# Patient Record
Sex: Female | Born: 1960 | Race: White | Hispanic: No | Marital: Married | Smoking: Never smoker
Health system: Southern US, Community
[De-identification: ages and names within clinical notes are randomized; demographics above are authoritative.]

## PROBLEM LIST (undated history)

## (undated) DIAGNOSIS — C801 Malignant (primary) neoplasm, unspecified: Secondary | ICD-10-CM

## (undated) DIAGNOSIS — R112 Nausea with vomiting, unspecified: Secondary | ICD-10-CM

## (undated) DIAGNOSIS — M1991 Primary osteoarthritis, unspecified site: Secondary | ICD-10-CM

## (undated) DIAGNOSIS — I10 Essential (primary) hypertension: Secondary | ICD-10-CM

## (undated) DIAGNOSIS — M1990 Unspecified osteoarthritis, unspecified site: Secondary | ICD-10-CM

## (undated) DIAGNOSIS — M25472 Effusion, left ankle: Secondary | ICD-10-CM

## (undated) DIAGNOSIS — G4730 Sleep apnea, unspecified: Secondary | ICD-10-CM

## (undated) DIAGNOSIS — L900 Lichen sclerosus et atrophicus: Secondary | ICD-10-CM

## (undated) DIAGNOSIS — E119 Type 2 diabetes mellitus without complications: Secondary | ICD-10-CM

## (undated) DIAGNOSIS — D649 Anemia, unspecified: Secondary | ICD-10-CM

## (undated) DIAGNOSIS — E785 Hyperlipidemia, unspecified: Secondary | ICD-10-CM

## (undated) DIAGNOSIS — G4733 Obstructive sleep apnea (adult) (pediatric): Secondary | ICD-10-CM

## (undated) DIAGNOSIS — K219 Gastro-esophageal reflux disease without esophagitis: Secondary | ICD-10-CM

## (undated) DIAGNOSIS — Z9889 Other specified postprocedural states: Secondary | ICD-10-CM

## (undated) DIAGNOSIS — E039 Hypothyroidism, unspecified: Secondary | ICD-10-CM

## (undated) DIAGNOSIS — M25471 Effusion, right ankle: Secondary | ICD-10-CM

## (undated) DIAGNOSIS — R42 Dizziness and giddiness: Secondary | ICD-10-CM

## (undated) HISTORY — DX: Hyperlipidemia, unspecified: E78.5

## (undated) HISTORY — DX: Obstructive sleep apnea (adult) (pediatric): G47.33

## (undated) HISTORY — DX: Essential (primary) hypertension: I10

## (undated) HISTORY — DX: Primary osteoarthritis, unspecified site: M19.91

## (undated) HISTORY — DX: Type 2 diabetes mellitus without complications: E11.9

## (undated) HISTORY — PX: TONSILLECTOMY: SUR1361

## (undated) HISTORY — DX: Gastro-esophageal reflux disease without esophagitis: K21.9

## (undated) HISTORY — PX: BREAST LUMPECTOMY: SHX2

## (undated) HISTORY — PX: ABDOMINAL HYSTERECTOMY: SHX81

## (undated) HISTORY — DX: Lichen sclerosus et atrophicus: L90.0

## (undated) HISTORY — PX: ELBOW SURGERY: SHX618

## (undated) HISTORY — PX: BARIATRIC SURGERY: SHX1103

---
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
## 1985-06-22 HISTORY — PX: KNEE ARTHROSCOPY W/ LATERAL RELEASE: SHX1873

## 1991-06-23 HISTORY — PX: KNEE ARTHROSCOPY: SUR90

## 1997-10-31 ENCOUNTER — Encounter: Admission: RE | Admit: 1997-10-31 | Discharge: 1998-01-29 | Payer: Self-pay | Admitting: *Deleted

## 1998-04-16 ENCOUNTER — Encounter: Admission: RE | Admit: 1998-04-16 | Discharge: 1998-07-12 | Payer: Self-pay | Admitting: Internal Medicine

## 1999-12-23 ENCOUNTER — Encounter: Payer: Self-pay | Admitting: Orthopedic Surgery

## 1999-12-29 ENCOUNTER — Ambulatory Visit (HOSPITAL_COMMUNITY): Admission: RE | Admit: 1999-12-29 | Discharge: 1999-12-29 | Payer: Self-pay | Admitting: Orthopedic Surgery

## 2000-06-22 HISTORY — PX: JOINT REPLACEMENT: SHX530

## 2001-05-17 ENCOUNTER — Encounter: Payer: Self-pay | Admitting: Orthopedic Surgery

## 2001-05-23 ENCOUNTER — Inpatient Hospital Stay (HOSPITAL_COMMUNITY): Admission: RE | Admit: 2001-05-23 | Discharge: 2001-05-27 | Payer: Self-pay | Admitting: Orthopedic Surgery

## 2001-05-24 ENCOUNTER — Encounter: Payer: Self-pay | Admitting: Orthopedic Surgery

## 2005-06-22 HISTORY — PX: GASTRIC BYPASS: SHX52

## 2011-06-17 ENCOUNTER — Encounter (HOSPITAL_COMMUNITY): Payer: Self-pay | Admitting: Pharmacy Technician

## 2011-06-25 ENCOUNTER — Encounter (HOSPITAL_COMMUNITY)
Admission: RE | Admit: 2011-06-25 | Discharge: 2011-06-25 | Disposition: A | Discharge status: Home / Self Care | Payer: BC Managed Care – PPO | Source: Ambulatory Visit | Attending: Orthopedic Surgery | Admitting: Orthopedic Surgery

## 2011-06-25 ENCOUNTER — Encounter (HOSPITAL_COMMUNITY): Payer: Self-pay

## 2011-06-25 ENCOUNTER — Other Ambulatory Visit: Payer: Self-pay | Admitting: Orthopedic Surgery

## 2011-06-25 ENCOUNTER — Other Ambulatory Visit (HOSPITAL_COMMUNITY): Payer: Self-pay | Admitting: *Deleted

## 2011-06-25 HISTORY — DX: Effusion, right ankle: M25.471

## 2011-06-25 HISTORY — DX: Malignant (primary) neoplasm, unspecified: C80.1

## 2011-06-25 HISTORY — DX: Anemia, unspecified: D64.9

## 2011-06-25 HISTORY — DX: Other specified postprocedural states: Z98.890

## 2011-06-25 HISTORY — DX: Dizziness and giddiness: R42

## 2011-06-25 HISTORY — DX: Nausea with vomiting, unspecified: R11.2

## 2011-06-25 HISTORY — DX: Hypothyroidism, unspecified: E03.9

## 2011-06-25 HISTORY — DX: Unspecified osteoarthritis, unspecified site: M19.90

## 2011-06-25 HISTORY — DX: Effusion, left ankle: M25.472

## 2011-06-25 HISTORY — DX: Effusion, left ankle: M25.471

## 2011-06-25 HISTORY — DX: Sleep apnea, unspecified: G47.30

## 2011-06-25 LAB — COMPREHENSIVE METABOLIC PANEL
AST: 14 U/L (ref 0–37)
Albumin: 4 g/dL (ref 3.5–5.2)
Chloride: 99 mEq/L (ref 96–112)
Creatinine, Ser: 0.53 mg/dL (ref 0.50–1.10)
Potassium: 3.9 mEq/L (ref 3.5–5.1)
Total Bilirubin: 0.3 mg/dL (ref 0.3–1.2)

## 2011-06-25 LAB — URINALYSIS, ROUTINE W REFLEX MICROSCOPIC
Leukocytes, UA: NEGATIVE
Nitrite: NEGATIVE
Protein, ur: NEGATIVE mg/dL
Urobilinogen, UA: 1 mg/dL (ref 0.0–1.0)

## 2011-06-25 LAB — TYPE AND SCREEN
ABO/RH(D): O POS
Antibody Screen: NEGATIVE

## 2011-06-25 LAB — SURGICAL PCR SCREEN
MRSA, PCR: NEGATIVE
Staphylococcus aureus: NEGATIVE

## 2011-06-25 LAB — DIFFERENTIAL
Basophils Relative: 1 % (ref 0–1)
Lymphs Abs: 2.2 10*3/uL (ref 0.7–4.0)
Monocytes Absolute: 0.5 10*3/uL (ref 0.1–1.0)
Monocytes Relative: 5 % (ref 3–12)
Neutro Abs: 8 10*3/uL — ABNORMAL HIGH (ref 1.7–7.7)

## 2011-06-25 LAB — CBC
MCV: 79.2 fL (ref 78.0–100.0)
Platelets: 369 10*3/uL (ref 150–400)
RDW: 15 % (ref 11.5–15.5)
WBC: 10.9 10*3/uL — ABNORMAL HIGH (ref 4.0–10.5)

## 2011-06-25 LAB — APTT: aPTT: 38 seconds — ABNORMAL HIGH (ref 24–37)

## 2011-06-25 NOTE — Progress Notes (Signed)
ekg REQUESTED FROM DR. SHULTZ OFFICE IN [place].  LAST SLEEP STUDY DONE MORE THAN 5 YRS AGO

## 2011-06-25 NOTE — Progress Notes (Signed)
OFFICE NOTIFIED THAT WE DO NOT HAVE TED'S THAT WILL FIT PATIENT.  CALF CIRCUMFERENCE 21 INCHES.

## 2011-06-25 NOTE — Pre-Procedure Instructions (Signed)
20 Brynlynn Bail  06/25/2011   Your procedure is scheduled on:07-06-2011 @10 :00 AM Report to [facility] at 8:00 AM.  Call this number if you have problems the morning of surgery: [phone]   Remember:   Do not eat food:After Midnight.  May have clear liquids: up to 4 Hours before arrival.  Clear liquids include soda, tea, black coffee, apple or grape juice, broth. UNTIL 4:00 AM  Take these medicines the morning of surgery with A SIP OF WATER: SYNTHROID,    Do not wear jewelry, make-up or nail polish.  Do not wear lotions, powders, or perfumes. You may wear deodorant.  Do not shave 48 hours prior to surgery.  Do not bring valuables to the hospital.  Contacts, dentures or bridgework may not be worn into surgery.  Leave suitcase in the car. After surgery it may be brought to your room.  For patients admitted to the hospital, checkout time is 11:00 AM the day of discharge.    Special Instructions: PRACTICE INCENTIVE SPIROMETER AND BRING BACK DAY OF SURGERY. HIBICLEINS SHOWER NIGHT BEFORE AND MORNING OF SURGERY   Please read over the following fact sheets that you were given: Blood Transfusion Information, MRSA Information and Surgical Site Infection Prevention

## 2011-06-26 NOTE — Progress Notes (Signed)
Requested ekg again from Dr. Maree Erie office,[phone].

## 2011-06-27 LAB — URINE CULTURE

## 2011-06-29 ENCOUNTER — Encounter (HOSPITAL_COMMUNITY): Payer: Self-pay | Admitting: Vascular Surgery

## 2011-06-29 NOTE — Consult Note (Addendum)
Anesthesia:  Patient is a 51 year old female scheduled for a left TKA on 07/06/11.  Her history includes hypothyroidism, OSA, obesity, hx of gastric bypass, post operative N/V, uterine cancer.  Her medical clearance note from [facility], Gaye Alken, NP also mentions HTN and DM2 although she is not requiring medications for these at this time.  Her AIC was 6.7 on 05/05/11.  She does not smoke.  Her CXR from 06/25/11 showed no active disease.  Her EKG on 05/05/11 from her PCP office showed SB at 59 bpm, LVH by voltage, and nonspecific inferior T wave changes (inverted T wave in III, but not in II or aVF).  Lead III changes are also noted on an EKG from 2002 (see Muse).  I reviewed her preoperative labs.  Her urine culture from 06/25/11 showed > 100,000 colonies of E. Coli.  I called this result to Greggory Stallion at Dr. Tobin Chad office.  Defer treatment recommendations to Dr. Sherlean Foot.  Plan to proceed from an Anesthesia standpoint.

## 2011-06-30 ENCOUNTER — Other Ambulatory Visit: Payer: Self-pay | Admitting: Orthopedic Surgery

## 2011-07-03 ENCOUNTER — Other Ambulatory Visit: Payer: Self-pay | Admitting: Orthopedic Surgery

## 2011-07-03 NOTE — H&P (Signed)
Kathleen Russo MRN:  [id number] DOB/SEX:  05/27/1961/female  CHIEF COMPLAINT:  Painful left Knee  HISTORY: Patient is a 51 y.o. female presented with a history of pain in the left knee. Onset of symptoms was gradual starting several years ago with gradually worsening course since that time. The patient noted no past surgery on the left knee. Prior procedures on the knee include meniscectomy. Patient has been treated conservatively with over-the-counter NSAIDs and activity modification. Patient currently rates pain in the knee at 7 out of 10 with activity. There is pain at night.  PAST MEDICAL HISTORY: There are no active problems to display for this patient.  Past Medical History  Diagnosis Date  . PONV (postoperative nausea and vomiting)   . Arthritis   . Hemorrhoids   . Hypothyroidism   . Anemia   . Sleep apnea     USES CPAP  . Cancer     UTERINE  . Dizziness   . Swelling of both ankles   . Hypertension     not requiring meds as of 04/2011  . Diabetes mellitus     DM2, not requiring meds as of 04/2011   Past Surgical History  Procedure Date  . Breast surgery 1979  . Tonsillectomy   . Knee arthroscopy w/ lateral release 1987    RT. KNEE  . Knee arthroscopy 1993    LT. KNEE  . Abdominal hysterectomy   . Joint replacement 2002    RT. KNEE  . Elbow surgery 1990'S  . Gastric bypass 2007     MEDICATIONS:   (Not in a hospital admission)  ALLERGIES:   Allergies  Allergen Reactions  . Nsaids     Medications don't absorb  . Quinolones     Medications don't absorb    REVIEW OF SYSTEMS:  Pertinent items are noted in HPI.   FAMILY HISTORY:  No family history on file.  SOCIAL HISTORY:   History  Substance Use Topics  . Smoking status: Never Smoker   . Smokeless tobacco: Not on file  . Alcohol Use: No     EXAMINATION:  Vital signs in last 24 hours: @VSRANGES @  General appearance: alert, cooperative and no distress Head: Normocephalic, without obvious  abnormality, atraumatic Lungs: clear to auscultation bilaterally Heart: regular rate and rhythm, S1, S2 normal, no murmur, click, rub or gallop Abdomen: soft, non-tender; bowel sounds normal; no masses,  no organomegaly Extremities: extremities normal, atraumatic, no cyanosis or edema and Homans sign is negative, no sign of DVT Pulses: 2+ and symmetric Skin: Skin color, texture, turgor normal. No rashes or lesions  Musculoskeletal:  ROM 0-120, Ligaments INTACT,  Imaging Review Plain radiographs demonstrate severe degenerative joint disease of the left knee. The overall alignment is mild varus. The bone quality appears to be good for age and reported activity level.  Assessment/Plan: End stage arthritis, left knee   The patient history, physical examination and imaging studies are consistent with advanced degenerative joint disease of the left knee. The patient has failed conservative treatment.  The clearance notes were reviewed.  After discussion with the patient it was felt that Total Knee Replacement was indicated. The procedure,  risks, and benefits of total knee arthroplasty were presented and reviewed. The risks including but not limited to aseptic loosening, infection, blood clots, vascular injury, stiffness, patella tracking problems complications among others were discussed. The patient acknowledged the explanation, agreed to proceed with the plan.  Kathleen Russo 07/03/2011, 2:27 PM

## 2011-07-05 MED ORDER — CHLORHEXIDINE GLUCONATE 4 % EX LIQD: 60.0000 mL | Freq: Once | CUTANEOUS | Status: DC

## 2011-07-05 MED ORDER — SODIUM CHLORIDE 0.9 % IV SOLN
INTRAVENOUS | Status: DC
  Administered 2011-07-06: 12:00:00 via INTRAVENOUS

## 2011-07-05 MED ORDER — CEFAZOLIN SODIUM-DEXTROSE 2-3 GM-% IV SOLR
2.0000 g | INTRAVENOUS | Status: AC
  Administered 2011-07-06: 2 g via INTRAVENOUS
  Filled 2011-07-05: qty 50

## 2011-07-06 ENCOUNTER — Encounter (HOSPITAL_COMMUNITY): Payer: Self-pay | Admitting: Vascular Surgery

## 2011-07-06 ENCOUNTER — Encounter (HOSPITAL_COMMUNITY)
Admission: RE | Disposition: A | Discharge status: Home / Self Care | Payer: Self-pay | Source: Ambulatory Visit | Attending: Orthopedic Surgery

## 2011-07-06 ENCOUNTER — Encounter (HOSPITAL_COMMUNITY): Payer: Self-pay | Admitting: *Deleted

## 2011-07-06 ENCOUNTER — Other Ambulatory Visit: Payer: Self-pay

## 2011-07-06 ENCOUNTER — Ambulatory Visit (HOSPITAL_COMMUNITY): Payer: BC Managed Care – PPO | Admitting: Vascular Surgery

## 2011-07-06 ENCOUNTER — Inpatient Hospital Stay (HOSPITAL_COMMUNITY)
Admission: RE | Admit: 2011-07-06 | Discharge: 2011-07-08 | DRG: 209 | Disposition: A | Discharge status: Home / Self Care | Payer: BC Managed Care – PPO | Source: Ambulatory Visit | Attending: Orthopedic Surgery | Admitting: Orthopedic Surgery

## 2011-07-06 DIAGNOSIS — E039 Hypothyroidism, unspecified: Secondary | ICD-10-CM | POA: Diagnosis present

## 2011-07-06 DIAGNOSIS — D62 Acute posthemorrhagic anemia: Secondary | ICD-10-CM | POA: Diagnosis not present

## 2011-07-06 DIAGNOSIS — Z888 Allergy status to other drugs, medicaments and biological substances status: Secondary | ICD-10-CM

## 2011-07-06 DIAGNOSIS — Z9884 Bariatric surgery status: Secondary | ICD-10-CM

## 2011-07-06 DIAGNOSIS — I10 Essential (primary) hypertension: Secondary | ICD-10-CM | POA: Diagnosis present

## 2011-07-06 DIAGNOSIS — M1710 Unilateral primary osteoarthritis, unspecified knee: Principal | ICD-10-CM | POA: Diagnosis present

## 2011-07-06 DIAGNOSIS — M1712 Unilateral primary osteoarthritis, left knee: Secondary | ICD-10-CM

## 2011-07-06 DIAGNOSIS — E119 Type 2 diabetes mellitus without complications: Secondary | ICD-10-CM | POA: Diagnosis present

## 2011-07-06 HISTORY — PX: TOTAL KNEE ARTHROPLASTY: SHX125

## 2011-07-06 LAB — GLUCOSE, CAPILLARY
Glucose-Capillary: 171 mg/dL — ABNORMAL HIGH (ref 70–99)
Glucose-Capillary: 151 mg/dL — ABNORMAL HIGH (ref 70–99)

## 2011-07-06 SURGERY — ARTHROPLASTY, KNEE, TOTAL
Anesthesia: General | Site: Knee | Laterality: Left | Wound class: Clean

## 2011-07-06 MED ORDER — HYDROMORPHONE HCL PF 1 MG/ML IJ SOLN
0.5000 mg | INTRAMUSCULAR | Status: DC | PRN
  Administered 2011-07-06 – 2011-07-07 (×5): 1 mg via INTRAVENOUS
  Filled 2011-07-06 (×5): qty 1

## 2011-07-06 MED ORDER — BUPIVACAINE-EPINEPHRINE PF 0.5-1:200000 % IJ SOLN
INTRAMUSCULAR | Status: DC | PRN
  Administered 2011-07-06: 30 mL

## 2011-07-06 MED ORDER — FENTANYL CITRATE 0.05 MG/ML IJ SOLN
50.0000 ug | INTRAMUSCULAR | Status: DC | PRN
  Administered 2011-07-06: 50 ug via INTRAVENOUS

## 2011-07-06 MED ORDER — ACETAMINOPHEN 325 MG PO TABS: 650.0000 mg | ORAL_TABLET | Freq: Four times a day (QID) | ORAL | Status: DC | PRN

## 2011-07-06 MED ORDER — SCOPOLAMINE 1 MG/3DAYS TD PT72: 1.0000 | MEDICATED_PATCH | TRANSDERMAL | Status: DC

## 2011-07-06 MED ORDER — ENOXAPARIN SODIUM 40 MG/0.4ML ~~LOC~~ SOLN
40.0000 mg | SUBCUTANEOUS | Status: DC
  Administered 2011-07-07 – 2011-07-08 (×2): 40 mg via SUBCUTANEOUS
  Filled 2011-07-06 (×3): qty 0.4

## 2011-07-06 MED ORDER — PROMETHAZINE HCL 25 MG/ML IJ SOLN: 6.2500 mg | INTRAMUSCULAR | Status: DC | PRN

## 2011-07-06 MED ORDER — BUPIVACAINE 0.25 % ON-Q PUMP SINGLE CATH 300ML
300.0000 mL | INJECTION | Status: DC
  Administered 2011-07-06: 300 mL
  Filled 2011-07-06 (×2): qty 300

## 2011-07-06 MED ORDER — FLEET ENEMA 7-19 GM/118ML RE ENEM: 1.0000 | ENEMA | Freq: Once | RECTAL | Status: AC | PRN

## 2011-07-06 MED ORDER — MONTELUKAST SODIUM 10 MG PO TABS
10.0000 mg | ORAL_TABLET | Freq: Every day | ORAL | Status: DC
Start: 2011-07-06 — End: 2011-07-08
  Administered 2011-07-06 – 2011-07-08 (×2): 10 mg via ORAL
  Filled 2011-07-06 (×4): qty 1

## 2011-07-06 MED ORDER — OXYCODONE HCL 5 MG PO TABS
5.0000 mg | ORAL_TABLET | ORAL | Status: DC | PRN
Start: 2011-07-06 — End: 2011-07-08
  Administered 2011-07-07: 5 mg via ORAL
  Administered 2011-07-07 (×2): 10 mg via ORAL
  Filled 2011-07-06 (×2): qty 2
  Filled 2011-07-06: qty 1

## 2011-07-06 MED ORDER — CEFAZOLIN SODIUM-DEXTROSE 2-3 GM-% IV SOLR
2.0000 g | Freq: Four times a day (QID) | INTRAVENOUS | Status: AC
  Administered 2011-07-06 – 2011-07-07 (×3): 2 g via INTRAVENOUS
  Filled 2011-07-06 (×3): qty 50

## 2011-07-06 MED ORDER — ZOLPIDEM TARTRATE 5 MG PO TABS: 5.0000 mg | ORAL_TABLET | Freq: Every evening | ORAL | Status: DC | PRN

## 2011-07-06 MED ORDER — SODIUM CHLORIDE 0.9 % IR SOLN
Status: DC | PRN
  Administered 2011-07-06: 3000 mL

## 2011-07-06 MED ORDER — DEXTROSE 5 % IV SOLN
500.0000 mg | Freq: Four times a day (QID) | INTRAVENOUS | Status: DC | PRN
  Filled 2011-07-06: qty 5

## 2011-07-06 MED ORDER — MENTHOL 3 MG MT LOZG: 1.0000 | LOZENGE | OROMUCOSAL | Status: DC | PRN

## 2011-07-06 MED ORDER — FENTANYL CITRATE 0.05 MG/ML IJ SOLN
INTRAMUSCULAR | Status: DC | PRN
  Administered 2011-07-06 (×2): 50 ug via INTRAVENOUS
  Administered 2011-07-06: 150 ug via INTRAVENOUS
  Administered 2011-07-06: 50 ug via INTRAVENOUS

## 2011-07-06 MED ORDER — ACETAMINOPHEN 10 MG/ML IV SOLN
1000.0000 mg | Freq: Four times a day (QID) | INTRAVENOUS | Status: DC
  Administered 2011-07-06: 1000 mg via INTRAVENOUS

## 2011-07-06 MED ORDER — OXYCODONE HCL 10 MG PO TB12
10.0000 mg | ORAL_TABLET | Freq: Two times a day (BID) | ORAL | Status: DC
  Administered 2011-07-06 – 2011-07-08 (×5): 10 mg via ORAL
  Filled 2011-07-06 (×5): qty 1

## 2011-07-06 MED ORDER — DOCUSATE SODIUM 100 MG PO CAPS
100.0000 mg | ORAL_CAPSULE | Freq: Two times a day (BID) | ORAL | Status: DC
  Administered 2011-07-06 – 2011-07-08 (×5): 100 mg via ORAL
  Filled 2011-07-06 (×6): qty 1

## 2011-07-06 MED ORDER — BISACODYL 5 MG PO TBEC: 5.0000 mg | DELAYED_RELEASE_TABLET | Freq: Every day | ORAL | Status: DC | PRN

## 2011-07-06 MED ORDER — LEVOTHYROXINE SODIUM 125 MCG PO TABS
125.0000 ug | ORAL_TABLET | Freq: Every day | ORAL | Status: DC
  Administered 2011-07-07 – 2011-07-08 (×2): 125 ug via ORAL
  Filled 2011-07-06 (×3): qty 1

## 2011-07-06 MED ORDER — LACTATED RINGERS IV SOLN
INTRAVENOUS | Status: DC
  Administered 2011-07-06: 09:00:00 via INTRAVENOUS

## 2011-07-06 MED ORDER — PROPOFOL 10 MG/ML IV EMUL
INTRAVENOUS | Status: DC | PRN
  Administered 2011-07-06: 200 mg via INTRAVENOUS

## 2011-07-06 MED ORDER — ONDANSETRON HCL 4 MG/2ML IJ SOLN
INTRAMUSCULAR | Status: DC | PRN
  Administered 2011-07-06: 4 mg via INTRAVENOUS

## 2011-07-06 MED ORDER — MIDAZOLAM HCL 2 MG/2ML IJ SOLN
1.0000 mg | INTRAMUSCULAR | Status: DC | PRN
  Administered 2011-07-06: 1 mg via INTRAVENOUS

## 2011-07-06 MED ORDER — BUPIVACAINE ON-Q PAIN PUMP (FOR ORDER SET NO CHG)
INJECTION | Status: DC
  Filled 2011-07-06: qty 1

## 2011-07-06 MED ORDER — HYDROMORPHONE HCL PF 1 MG/ML IJ SOLN
0.2500 mg | INTRAMUSCULAR | Status: DC | PRN
  Administered 2011-07-06 (×4): 0.5 mg via INTRAVENOUS

## 2011-07-06 MED ORDER — SODIUM CHLORIDE 0.9 % IV SOLN
INTRAVENOUS | Status: DC
  Administered 2011-07-07: 03:00:00 via INTRAVENOUS

## 2011-07-06 MED ORDER — ONDANSETRON HCL 4 MG/2ML IJ SOLN
4.0000 mg | Freq: Four times a day (QID) | INTRAMUSCULAR | Status: DC | PRN
  Administered 2011-07-07: 4 mg via INTRAVENOUS
  Filled 2011-07-06: qty 2

## 2011-07-06 MED ORDER — SCOPOLAMINE 1 MG/3DAYS TD PT72
MEDICATED_PATCH | TRANSDERMAL | Status: DC | PRN
  Administered 2011-07-06: 1 via TRANSDERMAL

## 2011-07-06 MED ORDER — FENTANYL CITRATE 0.05 MG/ML IJ SOLN
INTRAMUSCULAR | Status: AC
  Filled 2011-07-06: qty 2

## 2011-07-06 MED ORDER — ACETAMINOPHEN 10 MG/ML IV SOLN
INTRAVENOUS | Status: AC
  Filled 2011-07-06: qty 100

## 2011-07-06 MED ORDER — ONDANSETRON HCL 4 MG PO TABS: 4.0000 mg | ORAL_TABLET | Freq: Four times a day (QID) | ORAL | Status: DC | PRN

## 2011-07-06 MED ORDER — SENNOSIDES-DOCUSATE SODIUM 8.6-50 MG PO TABS: 1.0000 | ORAL_TABLET | Freq: Every evening | ORAL | Status: DC | PRN

## 2011-07-06 MED ORDER — LACTATED RINGERS IV SOLN
INTRAVENOUS | Status: DC | PRN
  Administered 2011-07-06 (×2): via INTRAVENOUS

## 2011-07-06 MED ORDER — PHENOL 1.4 % MT LIQD
1.0000 | OROMUCOSAL | Status: DC | PRN
  Filled 2011-07-06: qty 177

## 2011-07-06 MED ORDER — METOCLOPRAMIDE HCL 5 MG/ML IJ SOLN
5.0000 mg | Freq: Three times a day (TID) | INTRAMUSCULAR | Status: DC | PRN
  Filled 2011-07-06: qty 2

## 2011-07-06 MED ORDER — FLUTICASONE PROPIONATE 50 MCG/ACT NA SUSP
2.0000 | Freq: Every day | NASAL | Status: DC | PRN
  Filled 2011-07-06: qty 16

## 2011-07-06 MED ORDER — ALUM & MAG HYDROXIDE-SIMETH 200-200-20 MG/5ML PO SUSP
30.0000 mL | ORAL | Status: DC | PRN
  Administered 2011-07-07: 30 mL via ORAL

## 2011-07-06 MED ORDER — MIDAZOLAM HCL 2 MG/2ML IJ SOLN
INTRAMUSCULAR | Status: AC
  Filled 2011-07-06: qty 2

## 2011-07-06 MED ORDER — METOCLOPRAMIDE HCL 10 MG PO TABS: 5.0000 mg | ORAL_TABLET | Freq: Three times a day (TID) | ORAL | Status: DC | PRN

## 2011-07-06 MED ORDER — HYDROMORPHONE HCL PF 1 MG/ML IJ SOLN
INTRAMUSCULAR | Status: AC
  Filled 2011-07-06: qty 1

## 2011-07-06 MED ORDER — BUPIVACAINE-EPINEPHRINE 0.25% -1:200000 IJ SOLN
INTRAMUSCULAR | Status: DC | PRN
  Administered 2011-07-06: 20 mL

## 2011-07-06 MED ORDER — DIPHENHYDRAMINE HCL 12.5 MG/5ML PO ELIX
12.5000 mg | ORAL_SOLUTION | ORAL | Status: DC | PRN
  Filled 2011-07-06: qty 10

## 2011-07-06 MED ORDER — MIDAZOLAM HCL 5 MG/5ML IJ SOLN
INTRAMUSCULAR | Status: DC | PRN
  Administered 2011-07-06: 1 mg via INTRAVENOUS

## 2011-07-06 MED ORDER — METHOCARBAMOL 100 MG/ML IJ SOLN
500.0000 mg | INTRAVENOUS | Status: AC
  Administered 2011-07-06: 500 mg via INTRAVENOUS
  Filled 2011-07-06 (×2): qty 5

## 2011-07-06 MED ORDER — METHOCARBAMOL 500 MG PO TABS
500.0000 mg | ORAL_TABLET | Freq: Four times a day (QID) | ORAL | Status: DC | PRN
  Administered 2011-07-06 – 2011-07-07 (×2): 500 mg via ORAL
  Filled 2011-07-06 (×2): qty 1

## 2011-07-06 MED ORDER — LACTATED RINGERS IV SOLN: INTRAVENOUS | Status: DC

## 2011-07-06 SURGICAL SUPPLY — 57 items
BANDAGE ELASTIC 6 VELCRO ST LF (GAUZE/BANDAGES/DRESSINGS) ×2 IMPLANT
BANDAGE ESMARK 6X9 LF (GAUZE/BANDAGES/DRESSINGS) ×1 IMPLANT
BLADE SAGITTAL 13X1.27X60 (BLADE) ×2 IMPLANT
BLADE SAW SGTL 83.5X18.5 (BLADE) ×2 IMPLANT
BNDG ESMARK 6X9 LF (GAUZE/BANDAGES/DRESSINGS) ×2
BOWL SMART MIX CTS (DISPOSABLE) ×2 IMPLANT
CATH KIT ON Q 10IN SLV (PAIN MANAGEMENT) ×2 IMPLANT
CEMENT BONE SIMPLEX SPEEDSET (Cement) ×4 IMPLANT
CLOTH BEACON ORANGE TIMEOUT ST (SAFETY) ×2 IMPLANT
COVER BACK TABLE 24X17X13 BIG (DRAPES) IMPLANT
COVER SURGICAL LIGHT HANDLE (MISCELLANEOUS) ×2 IMPLANT
CUFF TOURNIQUET SINGLE 34IN LL (TOURNIQUET CUFF) ×2 IMPLANT
DRAPE EXTREMITY T 121X128X90 (DRAPE) ×2 IMPLANT
DRAPE INCISE IOBAN 66X45 STRL (DRAPES) ×4 IMPLANT
DRAPE PROXIMA HALF (DRAPES) ×2 IMPLANT
DRAPE U-SHAPE 47X51 STRL (DRAPES) ×2 IMPLANT
DRSG ADAPTIC 3X8 NADH LF (GAUZE/BANDAGES/DRESSINGS) ×2 IMPLANT
DRSG PAD ABDOMINAL 8X10 ST (GAUZE/BANDAGES/DRESSINGS) ×2 IMPLANT
DURAPREP 26ML APPLICATOR (WOUND CARE) ×4 IMPLANT
ELECT REM PT RETURN 9FT ADLT (ELECTROSURGICAL) ×2
ELECTRODE REM PT RTRN 9FT ADLT (ELECTROSURGICAL) ×1 IMPLANT
EVACUATOR 1/8 PVC DRAIN (DRAIN) ×2 IMPLANT
GLOVE BIOGEL M 7.0 STRL (GLOVE) ×2 IMPLANT
GLOVE BIOGEL PI IND STRL 7.5 (GLOVE) ×1 IMPLANT
GLOVE BIOGEL PI IND STRL 8.5 (GLOVE) ×2 IMPLANT
GLOVE BIOGEL PI INDICATOR 7.5 (GLOVE) ×1
GLOVE BIOGEL PI INDICATOR 8.5 (GLOVE) ×2
GLOVE SURG ORTHO 8.0 STRL STRW (GLOVE) ×4 IMPLANT
GOWN PREVENTION PLUS XLARGE (GOWN DISPOSABLE) ×4 IMPLANT
GOWN STRL NON-REIN LRG LVL3 (GOWN DISPOSABLE) ×4 IMPLANT
HANDPIECE INTERPULSE COAX TIP (DISPOSABLE) ×1
HOOD PEEL AWAY FACE SHEILD DIS (HOOD) ×6 IMPLANT
KIT BASIN OR (CUSTOM PROCEDURE TRAY) ×2 IMPLANT
KIT ROOM TURNOVER OR (KITS) ×2 IMPLANT
MANIFOLD NEPTUNE II (INSTRUMENTS) ×2 IMPLANT
NEEDLE 22X1 1/2 (OR ONLY) (NEEDLE) IMPLANT
NS IRRIG 1000ML POUR BTL (IV SOLUTION) ×2 IMPLANT
PACK TOTAL JOINT (CUSTOM PROCEDURE TRAY) ×2 IMPLANT
PAD ARMBOARD 7.5X6 YLW CONV (MISCELLANEOUS) ×4 IMPLANT
PADDING CAST COTTON 6X4 STRL (CAST SUPPLIES) ×2 IMPLANT
PADDING WEBRIL 6 STERILE (GAUZE/BANDAGES/DRESSINGS) ×2 IMPLANT
POSITIONER HEAD PRONE TRACH (MISCELLANEOUS) ×2 IMPLANT
SET HNDPC FAN SPRY TIP SCT (DISPOSABLE) ×1 IMPLANT
SPONGE GAUZE 4X4 12PLY (GAUZE/BANDAGES/DRESSINGS) ×2 IMPLANT
STAPLER VISISTAT 35W (STAPLE) ×2 IMPLANT
SUCTION FRAZIER TIP 10 FR DISP (SUCTIONS) ×2 IMPLANT
SUT BONE WAX W31G (SUTURE) ×2 IMPLANT
SUT VIC AB 0 CTB1 27 (SUTURE) ×4 IMPLANT
SUT VIC AB 1 CT1 27 (SUTURE) ×3
SUT VIC AB 1 CT1 27XBRD ANBCTR (SUTURE) ×3 IMPLANT
SUT VIC AB 2-0 CT1 27 (SUTURE) ×2
SUT VIC AB 2-0 CT1 TAPERPNT 27 (SUTURE) ×2 IMPLANT
SYR CONTROL 10ML LL (SYRINGE) IMPLANT
TOWEL OR 17X24 6PK STRL BLUE (TOWEL DISPOSABLE) ×2 IMPLANT
TOWEL OR 17X26 10 PK STRL BLUE (TOWEL DISPOSABLE) ×2 IMPLANT
TRAY FOLEY CATH 14FR (SET/KITS/TRAYS/PACK) ×2 IMPLANT
WATER STERILE IRR 1000ML POUR (IV SOLUTION) IMPLANT

## 2011-07-06 NOTE — Anesthesia Procedure Notes (Addendum)
Anesthesia Regional Block:  Femoral nerve block  Pre-Anesthetic Checklist: ,, timeout performed, Correct Patient, Correct Site, Correct Laterality, Correct Procedure, Correct Position, site marked, Risks and benefits discussed,  Surgical consent,  Pre-op evaluation,  At surgeon's request and post-op pain management  Laterality: Left  Prep: chloraprep       Needles:  Injection technique: Single-shot  Needle Type: Stimulator Needle - 40      Needle Gauge: 22 and 22 G    Additional Needles:  Procedures: nerve stimulator Femoral nerve block  Nerve Stimulator or Paresthesia:  Response: patella twitch, 0.45 mA, 0.1 ms,   Additional Responses:   Narrative:  Start time: 07/06/2011 8:34 AM End time: 07/06/2011 8:38 AM Injection made incrementally with aspirations every 5 mL.  Performed by: Personally  Anesthesiologist: Sandford Craze, MD  Additional Notes: Pt identified in Holding room.  Monitors applied. Working IV access confirmed. Sterile prep.  #22ga PNS to patella twitch at 0.37mA threshold.  30cc 0.5% Bupivacaine with 1:200k epi injected incrementally after negative test dose.  Patient asymptomatic, VSS, no heme aspirated, tolerated well.   Sandford Craze, MD  Femoral nerve block Procedure Name: LMA Insertion Date/Time: 07/06/2011 8:52 AM Performed by: Caryn Bee Pre-anesthesia Checklist: Patient identified, Emergency Drugs available, Suction available, Patient being monitored and Timeout performed Patient Re-evaluated:Patient Re-evaluated prior to inductionOxygen Delivery Method: Circle System Utilized Preoxygenation: Pre-oxygenation with 100% oxygen Intubation Type: IV induction Ventilation: Mask ventilation without difficulty LMA Size: 4.0 Number of attempts: 1 Tube secured with: Tape Dental Injury: Teeth and Oropharynx as per pre-operative assessment

## 2011-07-06 NOTE — Preoperative (Signed)
Beta Blockers   Reason not to administer Beta Blockers:Not Applicable 

## 2011-07-06 NOTE — Anesthesia Preprocedure Evaluation (Signed)
Anesthesia Evaluation  Patient identified by MRN, date of birth, ID band Patient awake    Reviewed: Allergy & Precautions, H&P , NPO status , Patient's Chart, lab work & pertinent test results  History of Anesthesia Complications (+) PONV  Airway Mallampati: I TM Distance: >3 FB Neck ROM: Full    Dental No notable dental hx. (+) Teeth Intact, Caps and Dental Advisory Given   Pulmonary neg pulmonary ROS, sleep apnea and Continuous Positive Airway Pressure Ventilation ,  clear to auscultation  Pulmonary exam normal       Cardiovascular hypertension (borderline, not on meds presently), Regular Normal Stress test 5 or 6 years ago OK as per patient   Neuro/Psych Negative Neurological ROS  Negative Psych ROS   GI/Hepatic negative GI ROS, Neg liver ROS,   Endo/Other  Diabetes mellitus- (diet control only since gastric bypass), Well ControlledHypothyroidism (treated) Morbid obesity (s/p gastric bypass 6 years ago, 100 lb weight loss)  Renal/GU negative Renal ROS     Musculoskeletal  (+) Arthritis -,   Abdominal (+) obese,   Peds  Hematology   Anesthesia Other Findings   Reproductive/Obstetrics                           Anesthesia Physical Anesthesia Plan  ASA: III  Anesthesia Plan: General   Post-op Pain Management:    Induction: Intravenous  Airway Management Planned: LMA  Additional Equipment:   Intra-op Plan:   Post-operative Plan:   Informed Consent: I have reviewed the patients History and Physical, chart, labs and discussed the procedure including the risks, benefits and alternatives for the proposed anesthesia with the patient or authorized representative who has indicated his/her understanding and acceptance.   Dental advisory given  Plan Discussed with: CRNA and Surgeon  Anesthesia Plan Comments: (Plan routine monitors, GA- LMA OK, femoral nerve block for post op analgesia)         Anesthesia Quick Evaluation

## 2011-07-06 NOTE — Transfer of Care (Signed)
Immediate Anesthesia Transfer of Care Note  Patient: Kathleen Russo  Procedure(s) Performed:  TOTAL KNEE ARTHROPLASTY  Patient Location: PACU  Anesthesia Type: General  Level of Consciousness: awake, alert  and oriented  Airway & Oxygen Therapy: Patient Spontanous Breathing and Patient connected to nasal cannula oxygen  Post-op Assessment: Report given to PACU RN and Post -op Vital signs reviewed and stable  Post vital signs: Reviewed and stable Filed Vitals:   07/06/11 1049  BP:   Pulse:   Temp: 36.7 C  Resp:     Complications: No apparent anesthesia complications

## 2011-07-06 NOTE — Interval H&P Note (Signed)
History and Physical Interval Note:  07/06/2011 7:35 AM  Kathleen Russo  has presented today for surgery, with the diagnosis of AO LEFT SIDE  The various methods of treatment have been discussed with the patient and family. After consideration of risks, benefits and other options for treatment, the patient has consented to  Procedure(s): TOTAL KNEE ARTHROPLASTY as a surgical intervention .  The patients' history has been reviewed, patient examined, no change in status, stable for surgery.  I have reviewed the patients' chart and labs.  Questions were answered to the patient's satisfaction.     Kyria Bumgardner,STEPHEN D

## 2011-07-06 NOTE — H&P (View-Only) (Signed)
Kathleen Russo MRN:  [id number] DOB/SEX:  05/27/1961/female  CHIEF COMPLAINT:  Painful left Knee  HISTORY: Patient is a 51 y.o. female presented with a history of pain in the left knee. Onset of symptoms was gradual starting several years ago with gradually worsening course since that time. The patient noted no past surgery on the left knee. Prior procedures on the knee include meniscectomy. Patient has been treated conservatively with over-the-counter NSAIDs and activity modification. Patient currently rates pain in the knee at 7 out of 10 with activity. There is pain at night.  PAST MEDICAL HISTORY: There are no active problems to display for this patient.  Past Medical History  Diagnosis Date  . PONV (postoperative nausea and vomiting)   . Arthritis   . Hemorrhoids   . Hypothyroidism   . Anemia   . Sleep apnea     USES CPAP  . Cancer     UTERINE  . Dizziness   . Swelling of both ankles   . Hypertension     not requiring meds as of 04/2011  . Diabetes mellitus     DM2, not requiring meds as of 04/2011   Past Surgical History  Procedure Date  . Breast surgery 1979  . Tonsillectomy   . Knee arthroscopy w/ lateral release 1987    RT. KNEE  . Knee arthroscopy 1993    LT. KNEE  . Abdominal hysterectomy   . Joint replacement 2002    RT. KNEE  . Elbow surgery 1990'S  . Gastric bypass 2007     MEDICATIONS:   (Not in a hospital admission)  ALLERGIES:   Allergies  Allergen Reactions  . Nsaids     Medications don't absorb  . Quinolones     Medications don't absorb    REVIEW OF SYSTEMS:  Pertinent items are noted in HPI.   FAMILY HISTORY:  No family history on file.  SOCIAL HISTORY:   History  Substance Use Topics  . Smoking status: Never Smoker   . Smokeless tobacco: Not on file  . Alcohol Use: No     EXAMINATION:  Vital signs in last 24 hours: @VSRANGES @  General appearance: alert, cooperative and no distress Head: Normocephalic, without obvious  abnormality, atraumatic Lungs: clear to auscultation bilaterally Heart: regular rate and rhythm, S1, S2 normal, no murmur, click, rub or gallop Abdomen: soft, non-tender; bowel sounds normal; no masses,  no organomegaly Extremities: extremities normal, atraumatic, no cyanosis or edema and Homans sign is negative, no sign of DVT Pulses: 2+ and symmetric Skin: Skin color, texture, turgor normal. No rashes or lesions  Musculoskeletal:  ROM 0-120, Ligaments INTACT,  Imaging Review Plain radiographs demonstrate severe degenerative joint disease of the left knee. The overall alignment is mild varus. The bone quality appears to be good for age and reported activity level.  Assessment/Plan: End stage arthritis, left knee   The patient history, physical examination and imaging studies are consistent with advanced degenerative joint disease of the left knee. The patient has failed conservative treatment.  The clearance notes were reviewed.  After discussion with the patient it was felt that Total Knee Replacement was indicated. The procedure,  risks, and benefits of total knee arthroplasty were presented and reviewed. The risks including but not limited to aseptic loosening, infection, blood clots, vascular injury, stiffness, patella tracking problems complications among others were discussed. The patient acknowledged the explanation, agreed to proceed with the plan.  Kathleen Russo 07/03/2011, 2:27 PM

## 2011-07-06 NOTE — Anesthesia Postprocedure Evaluation (Signed)
  Anesthesia Post-op Note  Patient: Kathleen Russo  Procedure(s) Performed:  TOTAL KNEE ARTHROPLASTY  Patient Location: PACU  Anesthesia Type: GA combined with regional for post-op pain  Level of Consciousness: sedated  Airway and Oxygen Therapy: Patient Spontanous Breathing and Patient connected to nasal cannula oxygen  Post-op Pain: mild  Post-op Assessment: Post-op Vital signs reviewed, Patient's Cardiovascular Status Stable, Respiratory Function Stable, Patent Airway, No signs of Nausea or vomiting and Pain level controlled  Post-op Vital Signs: Reviewed and stable  Complications: No apparent anesthesia complications

## 2011-07-06 NOTE — Op Note (Signed)
TOTAL KNEE REPLACEMENT OPERATIVE NOTE:  07/06/2011  12:06 PM  PATIENT:  Kathleen Russo  51 y.o. female  PRE-OPERATIVE DIAGNOSIS:  Osteoarthritis left knee  POST-OPERATIVE DIAGNOSIS:  Osteoarthritis left knee  PROCEDURE:  Procedure(s): TOTAL KNEE ARTHROPLASTY  SURGEON:  Surgeon(s): Raymon Mutton, MD  PHYSICIAN ASSISTANT: Altamese Cabal, [facility]  ANESTHESIA:   general  DRAINS: Hemovac and On-Q Marcaine Pain Pump  SPECIMEN: None  COUNTS:  Correct  TOURNIQUET:   Total Tourniquet Time Documented: Thigh (Left) - 50 minutes  DICTATION:  Indication for procedure:    The patient is a 51 y.o. female who has failed conservative treatment for Osteoarthritis left knee.  Informed consent was obtained prior to anesthesia. The risks versus benefits of the operation were explain and in a way the patient can, and did, understand.   Description of procedure:     The patient was taken to the operating room and placed under anesthesia.  The patient was positioned in the usual fashion taking care that all body parts were adequately padded and/or protected.  I foley catheter was placed.  A tourniquet was applied and the leg prepped and draped in the usual sterile fashion.  The extremity was exsanguinated with the esmarch and tourniquet inflated to 350 mmHg.  Pre-operative range of motion was normal.  The knee was in 4 degree of mild varus.  A midline incision approximately 6-7 inches long was made with a #10 blade.  A new blade was used to make a parapatellar arthrotomy going 2-3 cm into the quadriceps tendon, over the patella, and alongside the medial aspect of the patellar tendon.  A synovectomy was then performed with the #10 blade and forceps. I then elevated the deep MCL off the medial tibial metaphysis subperiosteally around to the semimembranosus attachment.    I everted the patella and used calipers to measure patellar thickness, which was ###.  I used the reamer to ream down to appropriate  thickness to recreated the native thickness.  I then removed excess bone with the rongeur and sagittal saw.  I used the ## mm template and drilled the three lug holes.  I then put the trial in place and measured the thickness with the calipers to ensure recreation of the native thickness.  The trial was then removed and the patella subluxed and the knee brought into flexion.  A homan retractor was place to retract and protect the patella and lateral structures.  A Z-retractor was place medially to protect the medial structures.  The extra-medullary alignment system was used to make cut the tibial articular surface perpendicular to the anamotic axis of the tibia and in 3 degrees of posterior slope.  The cut surface and alignment jig was removed.  I then used the intramedullary alignment guide to make a 3 valgus cut on the distal femur.  I then marked out the epicondylar axis on the distal femur.  The posterior condylar axis measured 3 degrees.  I then used the anterior referencing sizer and measured the femur to be a size E.  The 4-In-1 cutting block was screwed into place in external rotation matching the posterior condylar angle, making our cuts perpendicular to the epicondylar axis.  Anterior, posterior and chamfer cuts were made with the sagittal saw.  The cutting block and cut pieces were removed.  A lamina spreader was placed in 90 degrees of flexion.  The ACL, PCL, menisci, and posterior condylar osteophytes were removed.  A 17 mm spacer blocked was found to  offer good flexion and extension gap balance after minimal in degree releasing.   The scoop retractor was then placed and the femoral finishing block was pinned in place.  The small sagittal saw was used as well as the lug drill to finish the femur.  The block and cut surfaces were removed and the medullary canal hole filled with autograft bone from the cut pieces.  The tibia was delivered forward in deep flexion and external rotation.  A size 3  tray was selected and pinned into place centered on the medial 1/3 of the tibial tubercle.  The reamer and keel was used to prepare the tibia through the tray.    I then trialed with the size E femur, size 3 tibia, a 17 mm insert and the 32 patella.  I had excellent flexion/extension gap balance, excellent patella tracking.  Flexion was full and beyond 120 degrees; extension was zero.  These components were chosen and the staff opened them to me on the back table while the knee was lavaged copiously and the cement mixed.  I cemented in the components and removed all excess cement.  The polyethylene tibial component was snapped into place and the knee placed in extension while cement was hardening.  The capsule was infilltrated with 20cc of .25% Marcaine with epinephrine.  A hemovac was place in the joint exiting superolaterally.  A pain pump was place superomedially superficial to the arthrotomy.  Once the cement was hard, the tourniquet was let down.  Hemostasis was obtained.  The arthrotomy was closed with figure-8 #1 vicryl sutures.  The deep soft tissues were closed with #0 vicryls and the subcuticular layer closed with a running #2-0 vicryl.  The skin was reapproximated and closed with skin staples.  The wound was dressed with xeroform, 4 x4's, 2 ABD sponges, a single layer of webril and a TED stocking.   The patient was then awakened, extubated, and taken to the recovery room in stable condition.  BLOOD LOSS:  300cc DRAINS: 1 hemovac, 1 pain catheter COMPLICATIONS:  None.  PLAN OF CARE: Admit to inpatient   PATIENT DISPOSITION:  PACU - hemodynamically stable.   Delay start of Pharmacological VTE agent (>24hrs) due to surgical blood loss or risk of bleeding:  not applicable

## 2011-07-07 ENCOUNTER — Encounter (HOSPITAL_COMMUNITY): Payer: Self-pay | Admitting: Orthopedic Surgery

## 2011-07-07 LAB — GLUCOSE, CAPILLARY
Glucose-Capillary: 149 mg/dL — ABNORMAL HIGH (ref 70–99)
Glucose-Capillary: 146 mg/dL — ABNORMAL HIGH (ref 70–99)
Glucose-Capillary: 151 mg/dL — ABNORMAL HIGH (ref 70–99)

## 2011-07-07 LAB — BASIC METABOLIC PANEL
CO2: 28 mEq/L (ref 19–32)
Glucose, Bld: 148 mg/dL — ABNORMAL HIGH (ref 70–99)
Potassium: 3.9 mEq/L (ref 3.5–5.1)
Sodium: 137 mEq/L (ref 135–145)

## 2011-07-07 LAB — CBC
Hemoglobin: 10 g/dL — ABNORMAL LOW (ref 12.0–15.0)
RBC: 4.06 MIL/uL (ref 3.87–5.11)
WBC: 12.8 10*3/uL — ABNORMAL HIGH (ref 4.0–10.5)

## 2011-07-07 NOTE — Progress Notes (Signed)
Physical Therapy Evaluation Patient Details Name: Kathleen Russo MRN: [id number] DOB: 01-26-1961 Today's Date: 07/07/2011  Problem List: There is no problem list on file for this patient.   Past Medical History:  Past Medical History  Diagnosis Date  . PONV (postoperative nausea and vomiting)   . Arthritis   . Hemorrhoids   . Hypothyroidism   . Anemia   . Sleep apnea     USES CPAP  . Cancer     UTERINE  . Dizziness   . Swelling of both ankles   . Hypertension     not requiring meds as of 04/2011  . Diabetes mellitus     DM2, not requiring meds as of 04/2011   Past Surgical History:  Past Surgical History  Procedure Date  . Breast surgery 1979  . Tonsillectomy   . Knee arthroscopy w/ lateral release 1987    RT. KNEE  . Knee arthroscopy 1993    LT. KNEE  . Abdominal hysterectomy   . Joint replacement 2002    RT. KNEE  . Elbow surgery 1990'S  . Gastric bypass 2007    PT Assessment/Plan/Recommendation PT Assessment Clinical Impression Statement: Pt s/p L TKR presenting with increased pain, decreased L LE strength and ROM. Patient to benefit from skilled PT to maximize L LE recovery for safe d/c home. PT Recommendation/Assessment: Patient will need skilled PT in the acute care venue PT Problem List: Decreased strength;Decreased range of motion;Decreased balance;Decreased activity tolerance;Decreased mobility Barriers to Discharge: None PT Therapy Diagnosis : Difficulty walking;Abnormality of gait;Generalized weakness PT Plan PT Frequency: 7X/week PT Treatment/Interventions: Gait training;Stair training;DME instruction;Therapeutic activities;Therapeutic exercise;Balance training;Patient/family education PT Recommendation Follow Up Recommendations: Home health PT;Supervision for mobility/OOB Equipment Recommended:  (Has DME from previous surgery) PT Goals  Acute Rehab PT Goals PT Goal Formulation: With patient/family Time For Goal Achievement: 7 days Pt will go  Supine/Side to Sit: with supervision;with HOB 0 degrees PT Goal: Supine/Side to Sit - Progress: Progressing toward goal Pt will go Sit to Stand: with supervision PT Goal: Sit to Stand - Progress: Progressing toward goal Pt will Transfer Bed to Chair/Chair to Bed: with supervision PT Transfer Goal: Bed to Chair/Chair to Bed - Progress: Progressing toward goal Pt will Ambulate: >150 feet;with supervision;with rolling walker PT Goal: Ambulate - Progress: Progressing toward goal Pt will Go Up / Down Stairs: 1-2 stairs;with supervision;with rolling walker PT Goal: Up/Down Stairs - Progress: Progressing toward goal Pt will Perform Home Exercise Program: Independently  PT Evaluation Precautions/Restrictions  Precautions Precautions: Fall Restrictions Weight Bearing Restrictions: Yes LLE Weight Bearing: Weight bearing as tolerated Prior Functioning  Home Living Lives With: Spouse Receives Help From: Family Type of Home: House Home Layout: One level Home Access:  (4 in step) Bathroom Shower/Tub: Engineer, manufacturing systems: Standard Bathroom Accessibility: Yes How Accessible: Accessible via walker Home Adaptive Equipment: Bedside commode/3-in-1;Walker - rolling Prior Function Level of Independence: Independent with basic ADLs;Independent with gait;Independent with transfers Able to Take Stairs?: Yes Driving: Yes Vocation: Full time employment Cognition Cognition Arousal/Alertness: Lethargic Overall Cognitive Status: Appears within functional limits for tasks assessed Orientation Level: Oriented X4 Cognition - Other Comments:  (required verbal cues to maintain eye opening) PAIN:  8/10  Sensation/Coordination Sensation Light Touch: Appears Intact Extremity Assessment RUE Assessment RUE Assessment: Within Functional Limits LUE Assessment LUE Assessment: Within Functional Limits RLE Assessment RLE Assessment: Within Functional Limits LLE Assessment LLE Assessment:  (NT  due to pt s/p L TKR) Mobility (including Balance) Bed Mobility Bed Mobility:  Yes Supine to Sit: 3: Mod assist;With rails;HOB elevated (Comment degrees) (HOB at 19 degrees) Supine to Sit Details (indicate cue type and reason): assist required for L LE managment and trunk assist Transfers Transfers: Yes Sit to Stand: 3: Mod assist;From bed Sit to Stand Details (indicate cue type and reason): verbal cues for hand placment and sequencing Stand Pivot Transfers: 4: Min assist Stand Pivot Transfer Details (indicate cue type and reason): verbal cues for walker management Ambulation/Gait Ambulation/Gait: Yes Ambulation/Gait Assistance: 4: Min assist Ambulation/Gait Assistance Details (indicate cue type and reason): 6 steps to chair Ambulation Distance (Feet):  (6 steps to chair) Assistive device: Rolling walker Gait Pattern: Step-to pattern;Decreased step length - left;Decreased hip/knee flexion - left;Antalgic Gait velocity: decreased cadence Stairs: No  Balance Balance Assessed:  (patient requires RW for standing balance and safe ambulation) Exercise  Total Joint Exercises Ankle Circles/Pumps: AROM;Both;10 reps;Supine Quad Sets: AROM;Both;10 reps;Supine Heel Slides: AAROM;Left;10 reps;Supine End of Session PT - End of Session Equipment Utilized During Treatment: Gait belt Activity Tolerance: Patient tolerated treatment well Patient left: in chair (instructed family to alert RN to place pt in CPM upon return to bed General Behavior During Session: Lethargic Cognition: [facility] for tasks performed  Marcene Brawn 07/07/2011, 10:57 AM  Lewis Shock, PT, DPT Pager #: [phone] Office #: [phone]

## 2011-07-07 NOTE — Progress Notes (Signed)
Physical Therapy Treatment Patient Details Name: Kathleen Russo MRN: [id number] DOB: February 20, 1961 Today's Date: 07/07/2011  PT Assessment/Plan  PT - Assessment/Plan Comments on Treatment Session: Pt able to increase ambulation distance this PM.  Still slightly drowsy & c/o nausea at end of session.  PT Plan: Discharge plan remains appropriate PT Frequency: 7X/week Follow Up Recommendations: Home health PT;Supervision for mobility/OOB Equipment Recommended: None recommended by PT (Has DME from previous surgery.  ) PT Goals  Acute Rehab PT Goals PT Goal: Sit to Stand - Progress: Progressing toward goal PT Goal: Ambulate - Progress: Progressing toward goal  PT Treatment Precautions/Restrictions  Precautions Precautions: Knee Restrictions Weight Bearing Restrictions: Yes LLE Weight Bearing: Weight bearing as tolerated Mobility (including Balance) Bed Mobility Bed Mobility: No Sit to Supine: 2: Max assist Sit to Supine - Details (indicate cue type and reason): Max cues for sequencing, technique, use of UE's to increase ease of transition.  (A) for bil LE's, to support/guide shoulders/trunk to supine.   Transfers Sit to Stand: 3: Mod assist;From chair/3-in-1;With armrests;With upper extremity assist Sit to Stand Details (indicate cue type and reason): (A) to achieve standing, balance, & safety.  Cues for hand placement, LLE positioning.   Stand to Sit: 4: Min assist;To chair/3-in-1;To bed;With armrests;With upper extremity assist Stand to Sit Details: (A) to control descent & safety; Cues for hand placement, LLE positioning, use of UE's to control descent.   Ambulation/Gait Ambulation/Gait Assistance: 4: Min assist Ambulation/Gait Assistance Details (indicate cue type and reason): (A) for balance, lateral wt shifting, advancement of RW.  Cues for sequencing, RW management, & technique.   Ambulation Distance (Feet): 15 Feet Assistive device: Rolling walker Gait Pattern: Step-to  pattern;Antalgic;Decreased stance time - left;Decreased step length - right;Decreased step length - left;Decreased weight shift to left Stairs: No Wheelchair Mobility Wheelchair Mobility: No    Exercise  Total Joint Exercises Ankle Circles/Pumps: AROM;Both;10 reps;Supine Quad Sets: Strengthening;AROM;Both;10 reps;Supine Gluteal Sets: AROM;Strengthening;Both;10 reps;Supine End of Session PT - End of Session Equipment Utilized During Treatment: Gait belt Activity Tolerance: Patient tolerated treatment well;Other (comment) (c/o nausea at end of session.  ) Patient left: in bed;with call bell in reach;with family/visitor present General Behavior During Session: Lethargic Cognition: WFL for tasks performed  Lara Mulch 07/07/2011, 3:14 PM [phone]

## 2011-07-07 NOTE — Progress Notes (Signed)
Occupational Therapy Evaluation Patient Details Name: Kathleen Russo MRN: [id number] DOB: 08/25/1960 Today's Date: 07/07/2011  Problem List: There is no problem list on file for this patient.   Past Medical History:  Past Medical History  Diagnosis Date  . PONV (postoperative nausea and vomiting)   . Arthritis   . Hemorrhoids   . Hypothyroidism   . Anemia   . Sleep apnea     USES CPAP  . Cancer     UTERINE  . Dizziness   . Swelling of both ankles   . Hypertension     not requiring meds as of 04/2011  . Diabetes mellitus     DM2, not requiring meds as of 04/2011   Past Surgical History:  Past Surgical History  Procedure Date  . Breast surgery 1979  . Tonsillectomy   . Knee arthroscopy w/ lateral release 1987    RT. KNEE  . Knee arthroscopy 1993    LT. KNEE  . Abdominal hysterectomy   . Joint replacement 2002    RT. KNEE  . Elbow surgery 1990'S  . Gastric bypass 2007  . Total knee arthroplasty 07/06/2011    Procedure: TOTAL KNEE ARTHROPLASTY;  Surgeon: Raymon Mutton, MD;  Location: [facility] OR;  Service: Orthopedics;  Laterality: Left;    OT Assessment/Plan/Recommendation OT Assessment Clinical Impression Statement: Pt s/p L TKR presenting with increased pain affecting pt's functional independence with ADLs and will benefit from skilled OT to get pt. to modI level at D/C home. OT Recommendation/Assessment: Patient will need skilled OT in the acute care venue OT Problem List: Decreased strength;Decreased activity tolerance;Decreased knowledge of use of DME or AE;Pain Barriers to Discharge: None OT Therapy Diagnosis : Acute pain OT Plan OT Frequency: Min 2X/week OT Treatment/Interventions: Self-care/ADL training;DME and/or AE instruction;Therapeutic activities;Patient/family education OT Recommendation Follow Up Recommendations: Home health OT;Supervision - Intermittent Equipment Recommended: None recommended by OT Individuals Consulted Consulted and Agree with  Results and Recommendations: Patient;Family member/caregiver Family Member Consulted: Husband OT Goals Acute Rehab OT Goals OT Goal Formulation: With patient Time For Goal Achievement: 7 days ADL Goals Pt Will Perform Lower Body Bathing: with modified independence;Sit to stand from bed (with use of long handled sponge) ADL Goal: Lower Body Bathing - Progress: Not met Pt Will Perform Lower Body Dressing: with modified independence;with adaptive equipment;Sit to stand from bed ADL Goal: Lower Body Dressing - Progress: Not met Pt Will Transfer to Toilet: with modified independence;Ambulation;3-in-1 ADL Goal: Toilet Transfer - Progress: Not met Pt Will Perform Tub/Shower Transfer: with supervision;with DME;Ambulation;Shower transfer;Shower seat with back ADL Goal: Web designer - Progress: Not met  OT Evaluation Precautions/Restrictions  Precautions Precautions: Fall Restrictions Weight Bearing Restrictions: Yes LLE Weight Bearing: Weight bearing as tolerated Prior Functioning Home Living Lives With: Spouse Receives Help From: Family Type of Home: House Home Layout: One level Bathroom Shower/Tub: Engineer, manufacturing systems: Standard Bathroom Accessibility: Yes How Accessible: Accessible via walker Home Adaptive Equipment: Bedside commode/3-in-1;Walker - rolling Prior Function Level of Independence: Independent with basic ADLs;Independent with gait;Independent with transfers Able to Take Stairs?: Yes Driving: Yes Vocation: Full time employment ADL ADL Eating/Feeding: Performed;Independent Where Assessed - Eating/Feeding: Chair Grooming: Wash/dry face;Performed;Set up Where Assessed - Grooming: Sitting, chair Upper Body Bathing: Simulated;Chest;Right arm;Left arm;Abdomen;Set up Where Assessed - Upper Body Bathing: Sitting, chair Lower Body Bathing: Simulated;Minimal assistance (with using long handled sponge) Where Assessed - Lower Body Bathing: Sitting,  chair Upper Body Dressing: Performed;Minimal assistance Upper Body Dressing Details (indicate cue type and  reason): with donning gown Where Assessed - Upper Body Dressing: Sitting, chair Lower Body Dressing: Simulated;Minimal assistance Lower Body Dressing Details (indicate cue type and reason): demonstrated use of sock aid and reacher to complete LB ADLs Where Assessed - Lower Body Dressing: Sitting, chair Toilet Transfer: Simulated;Moderate assistance Toilet Transfer Details (indicate cue type and reason): Mod verbal cues for hand placement to facilitate upright standing position Toilet Transfer Method: Other (comment) (sit-stand) Toilet Transfer Equipment: Other (comment) (recliner) Toileting - Clothing Manipulation: Simulated;Minimal assistance Toileting - Clothing Manipulation Details (indicate cue type and reason): with moving gown Where Assessed - Glass blower/designer Manipulation: Standing Toileting - Hygiene: Simulated;Minimal assistance Where Assessed - Toileting Hygiene: Standing Tub/Shower Transfer: Not assessed Tub/Shower Transfer Method: Not assessed Equipment Used: Rolling walker Ambulation Related to ADLs: Not performed ADL Comments: Pt. provided with demonstration of use of sock aid and reacher to complete LB ADLs. Pt. declined use at this time due to increased pain and pt. completed sit-stand during changing of gown and requested to sit due to increased pain and P.T. present at the end of session to ambulate pt.  Vision/Perception  Vision - History Baseline Vision: Wears glasses all the time Patient Visual Report: No change from baseline Vision - Assessment Eye Alignment: Within Functional Limits Vision Assessment: Vision not tested   Extremity Assessment RUE Assessment RUE Assessment: Within Functional Limits LUE Assessment LUE Assessment: Within Functional Limits Mobility  Bed Mobility Bed Mobility: No Transfers Transfers: Yes Sit to Stand: 3: Mod assist;From  bed Sit to Stand Details (indicate cue type and reason): Mod verbal cues for hand placement on arm rests     End of Session OT - End of Session Equipment Utilized During Treatment: Gait belt Activity Tolerance: Patient limited by pain Patient left: in chair;with call bell in reach;Other (comment);with family/visitor present (P.T. present ) Nurse Communication: Mobility status for transfers General Behavior During Session: Lethargic Cognition: [facility] for tasks performed   Lynann Demetrius, OTR/L Pager [phone] 07/07/2011, 1:08 PM

## 2011-07-07 NOTE — Progress Notes (Signed)
PATIENT ID:      Kathleen Russo  MRN:     [id number] DOB/AGE:    09/13/1960 / 51 y.o.    PROGRESS NOTE Subjective:  negative for Chest Pain  negative for Shortness of Breath  negative for Nausea/Vomiting   negative for Calf Pain  negative for Bowel Movement   Tolerating Diet: yes         Patient reports pain as 5 on 0-10 scale.    Objective: Vital signs in last 24 hours:  Patient Vitals for the past 24 hrs:  BP Temp Pulse Resp SpO2  07/07/11 0559 154/82 mmHg 100.3 F (37.9 C) 84  20  91 %  07/07/11 0259 144/78 mmHg 99.4 F (37.4 C) 81  20  92 %  07/06/11 2202 147/78 mmHg 99.7 F (37.6 C) 79  20  90 %  07/06/11 1939 - - 75  16  97 %  07/06/11 1307 143/67 mmHg 97.6 F (36.4 C) 70  12  96 %  07/06/11 1300 - - 73  15  96 %  07/06/11 1245 - 97.6 F (36.4 C) 73  10  98 %  07/06/11 1237 154/76 mmHg - - - -  07/06/11 1230 - - 73  15  94 %  07/06/11 1222 148/75 mmHg - - - -  07/06/11 1215 - - 68  14  94 %  07/06/11 1207 147/72 mmHg - - - -  07/06/11 1200 - - 67  15  94 %  07/06/11 1152 150/76 mmHg - - - -  07/06/11 1145 168/85 mmHg - 67  15  93 %  07/06/11 1137 168/85 mmHg - - - -  07/06/11 1130 164/91 mmHg - 68  15  95 %  07/06/11 1125 - - 71  19  96 %  07/06/11 1115 181/83 mmHg - 69  12  100 %  07/06/11 1100 178/88 mmHg - 68  19  94 %  07/06/11 1049 - 98 F (36.7 C) - - 94 %  07/06/11 0838 - - 58  14  100 %  07/06/11 0836 - - 61  14  100 %  07/06/11 0834 - - 64  15  100 %  07/06/11 0832 - - 60  13  100 %      Intake/Output from previous day:   01/14 0701 - 01/15 0700 In: 1255 [I.V.:1200] Out: 1290 [Urine:625; Drains:625]   Intake/Output this shift:       Intake/Output      01/14 0701 - 01/15 0700 01/15 0701 - 01/16 0700   I.V. 1200    IV Piggyback 55    Total Intake 1255    Urine 625    Drains 625    Blood 40    Total Output 1290    Net -35            LABORATORY DATA:  Basename 07/07/11 0622  WBC 12.8*  HGB 10.0*  HCT 32.2*  PLT 305    Basename  07/07/11 0622  NA 137  K 3.9  CL 99  CO2 28  BUN 8  CREATININE 0.40*  GLUCOSE 148*  CALCIUM 8.3*   Lab Results  Component Value Date   INR 0.92 06/25/2011    Examination:  General appearance: alert, cooperative and no distress Extremities: Homans sign is negative, no sign of DVT  Wound Exam: clean, dry, intact   Drainage:  Scant/small amount Serosanguinous exudate  Motor Exam EHL and FHL Intact  Sensory  Exam Superficial Peroneal normal  Assessment:    1 Day Post-Op  Procedure(s) (LRB): TOTAL KNEE ARTHROPLASTY (Left)  ADDITIONAL DIAGNOSIS:  Active Problems:  * No active hospital problems. *   Acute Blood Loss Anemia   Plan: Physical Therapy as ordered Weight Bearing as Tolerated (WBAT)  DVT Prophylaxis:  Lovenox  DISCHARGE PLAN: Home  DISCHARGE NEEDS: HHPT, CPM, Walker and 3-in-1 comode seat         Londin Antone 07/07/2011, 8:23 AM

## 2011-07-08 LAB — BASIC METABOLIC PANEL
Chloride: 96 mEq/L (ref 96–112)
Creatinine, Ser: 0.49 mg/dL — ABNORMAL LOW (ref 0.50–1.10)
GFR calc Af Amer: >90 mL/min (ref >90)
GFR calc non Af Amer: >90 mL/min (ref >90)
Potassium: 3.8 mEq/L (ref 3.5–5.1)

## 2011-07-08 LAB — CBC
MCHC: 31.8 g/dL (ref 30.0–36.0)
MCV: 79.5 fL (ref 78.0–100.0)
Platelets: 248 10*3/uL (ref 150–400)
RDW: 14.9 % (ref 11.5–15.5)
WBC: 13.4 10*3/uL — ABNORMAL HIGH (ref 4.0–10.5)

## 2011-07-08 LAB — GLUCOSE, CAPILLARY: Glucose-Capillary: 158 mg/dL — ABNORMAL HIGH (ref 70–99)

## 2011-07-08 MED ORDER — OXYCODONE HCL 10 MG PO TB12: 10.0000 mg | ORAL_TABLET | Freq: Two times a day (BID) | ORAL | Status: AC

## 2011-07-08 MED ORDER — ENOXAPARIN SODIUM 40 MG/0.4ML ~~LOC~~ SOLN: 40.0000 mg | SUBCUTANEOUS | Status: DC

## 2011-07-08 MED ORDER — OXYCODONE HCL 5 MG PO TABS: 5.0000 mg | ORAL_TABLET | ORAL | Status: AC | PRN

## 2011-07-08 MED ORDER — METHOCARBAMOL 500 MG PO TABS: 500.0000 mg | ORAL_TABLET | Freq: Four times a day (QID) | ORAL | Status: AC | PRN

## 2011-07-08 NOTE — Progress Notes (Signed)
PATIENT ID:      Kathleen Russo  MRN:     [id number] DOB/AGE:    09-09-1960 / 51 y.o.    PROGRESS NOTE Subjective:  negative for Chest Pain  negative for Shortness of Breath  negative for Nausea/Vomiting   negative for Calf Pain  negative for Bowel Movement   Tolerating Diet: yes         Patient reports pain as 5 on 0-10 scale.    Objective: Vital signs in last 24 hours:  Patient Vitals for the past 24 hrs:  BP Temp Temp src Pulse Resp SpO2  07/08/11 0501 128/80 mmHg 98.9 F (37.2 C) Oral 98  18  92 %  07/07/11 2150 136/79 mmHg 99 F (37.2 C) Oral 99  18  90 %  07/07/11 1955 - - - 93  18  100 %  07/07/11 1400 115/76 mmHg 99 F (37.2 C) - 91  18  -      Intake/Output from previous day:   01/15 0701 - 01/16 0700 In: 360 [P.O.:360] Out: 500 [Urine:400; Drains:100]   Intake/Output this shift:       Intake/Output      01/15 0701 - 01/16 0700 01/16 0701 - 01/17 0700   P.O. 360    I.V.     IV Piggyback     Total Intake 360    Urine 400    Drains 100    Blood     Total Output 500    Net -140         Urine Occurrence 2 x       LABORATORY DATA:  Basename 07/08/11 0620 07/07/11 0622  WBC 13.4* 12.8*  HGB 9.9* 10.0*  HCT 31.1* 32.2*  PLT 248 305    Basename 07/08/11 0620 07/07/11 0622  NA 135 137  K 3.8 3.9  CL 96 99  CO2 33* 28  BUN 5* 8  CREATININE 0.49* 0.40*  GLUCOSE 167* 148*  CALCIUM 8.7 8.3*   Lab Results  Component Value Date   INR 0.92 06/25/2011    Examination:  General appearance: alert, cooperative and no distress Extremities: Homans sign is negative, no sign of DVT  Wound Exam: clean, dry, intact   Drainage:  None: wound tissue dry  Motor Exam EHL and FHL Intact  Sensory Exam Superficial Peroneal normal  Assessment:    2 Days Post-Op  Procedure(s) (LRB): TOTAL KNEE ARTHROPLASTY (Left)  ADDITIONAL DIAGNOSIS:  Active Problems:  * No active hospital problems. *   Acute Blood Loss Anemia   Plan: Physical Therapy as ordered  Weight Bearing as Tolerated (WBAT)  DVT Prophylaxis:  Lovenox  DISCHARGE PLAN: Home  DISCHARGE NEEDS: HHPT, CPM, Walker and 3-in-1 comode seat         Tawnya Pujol 07/08/2011, 7:16 AM

## 2011-07-08 NOTE — Progress Notes (Signed)
Referred to this CSW today for ?SNF. Chart reviewed and have spoken with RNCM and Care Coordinator who indicate patient plans to d/c home with HH and DME. CSW to sign off- please contact us if SW needs arise. Kamica Florance, MSW, LCSWA [phone]   

## 2011-07-08 NOTE — Discharge Summary (Signed)
PATIENT ID:      Kathleen Russo  MRN:     [id number] DOB/AGE:    [birth year] / 51 y.o.     DISCHARGE SUMMARY  ADMISSION DATE:    07/06/2011 DISCHARGE DATE:   07/08/2011   ADMISSION DIAGNOSIS: AO LEFT SIDE  (Osteoarthritis left knee)  DISCHARGE DIAGNOSIS:  Osteoarthritis left knee    ADDITIONAL DIAGNOSIS: Active Problems:  * No active hospital problems. *   Past Medical History  Diagnosis Date  . PONV (postoperative nausea and vomiting)   . Arthritis   . Hemorrhoids   . Hypothyroidism   . Anemia   . Sleep apnea     USES CPAP  . Cancer     UTERINE  . Dizziness   . Swelling of both ankles   . Hypertension     not requiring meds as of 04/2011  . Diabetes mellitus     DM2, not requiring meds as of 04/2011    PROCEDURE: Procedure(s): TOTAL KNEE ARTHROPLASTY on 07/06/2011  CONSULTS:     HISTORY:  See H&P in chart  HOSPITAL COURSE:  Kathleen Russo is a 51 y.o. admitted on 07/06/2011 and found to have a diagnosis of Osteoarthritis left knee.  After appropriate laboratory studies were obtained  they were taken to the operating room on 07/06/2011 and underwent Procedure(s): TOTAL KNEE ARTHROPLASTY.   They were given perioperative antibiotics:  Anti-infectives     Start     Dose/Rate Route Frequency Ordered Stop   07/06/11 1500   ceFAZolin (ANCEF) IVPB 2 g/50 mL premix        2 g 100 mL/hr over 30 Minutes Intravenous Every 6 hours 07/06/11 1313 07/14/2011 0307   07/06/11 0000   ceFAZolin (ANCEF) IVPB 2 g/50 mL premix        2 g 100 mL/hr over 30 Minutes Intravenous 60 min pre-op 07/05/11 1005 07/06/11 0845        . Blood products given:none   The remainder of the hospital course was dedicated to ambulation and strengthening.   The patient was discharged on 2 Days Post-Op in  Good condition.   DIAGNOSTIC STUDIES: Recent vital signs: Patient Vitals for the past 24 hrs:  BP Temp Temp src Pulse Resp SpO2  07/08/11 0501 128/80 mmHg 98.9 F (37.2 C) Oral 98  18  92 %    14-Jul-2011 2150 136/79 mmHg 99 F (37.2 C) Oral 99  18  90 %  Jul 14, 2011 1955 - - - 93  18  100 %  Jul 14, 2011 1400 115/76 mmHg 99 F (37.2 C) - 91  18  -       Recent laboratory studies:  [facility] 07/08/11 0620 07/14/11 0622  WBC 13.4* 12.8*  HGB 9.9* 10.0*  HCT 31.1* 32.2*  PLT 248 305    Basename 07/08/11 0620 July 14, 2011 0622  NA 135 137  K 3.8 3.9  CL 96 99  CO2 33* 28  BUN 5* 8  CREATININE 0.49* 0.40*  GLUCOSE 167* 148*  CALCIUM 8.7 8.3*   Lab Results  Component Value Date   INR 0.92 06/25/2011     Recent Radiographic Studies :  Dg Chest 2 View  06/25/2011  *RADIOLOGY REPORT*  Clinical Data: Left knee osteoarthritis.  Sleep apnea.  Preop respiratory exam for knee joint replacement.  CHEST - 2 VIEW  Comparison: None.  Findings: Heart size is normal.  Both lungs are clear.  No evidence of pleural effusion.  Mild tortuosity of thoracic aorta is seen,  however no mass or lymphadenopathy identified.  IMPRESSION: No active disease.  Original Report Authenticated By: Danae Orleans, M.D.    DISCHARGE INSTRUCTIONS: Discharge Orders    Future Orders Please Complete By Expires   Diet - low sodium heart healthy      Call MD / Call 911      Comments:   If you experience chest pain or shortness of breath, CALL 911 and be transported to the hospital emergency room.  If you develope a fever above 101 F, pus (white drainage) or increased drainage or redness at the wound, or calf pain, call your surgeon's office.   Constipation Prevention      Comments:   Drink plenty of fluids.  Prune juice may be helpful.  You may use a stool softener, such as Colace (over the counter) 100 mg twice a day.  Use MiraLax (over the counter) for constipation as needed.   Increase activity slowly as tolerated      Weight Bearing as taught in Physical Therapy      Comments:   Use a walker or crutches as instructed.   Patient may shower      Comments:   You may shower without a dressing once there is no  drainage.  Do not wash over the wound.  If drainage remains, cover wound with plastic wrap and then shower.   Driving restrictions      Comments:   No driving for 6 weeks   Lifting restrictions      Comments:   No lifting for 6 weeks   CPM      Comments:   Continuous passive motion machine (CPM):      Use the CPM from 0 to 90 for 6-8 hours per day.      You may increase by 10 per day.  You may break it up into 2 or 3 sessions per day.      Use CPM for 2 weeks or until you are told to stop.   TED hose      Comments:   Use stockings (TED hose) for 3 weeks on both leg(s).  You may remove them at night for sleeping.   Change dressing      Comments:   Change dressing on thursday, then change the dressing daily with sterile 4 x 4 inch gauze dressing and apply TED hose.  You may clean the incision with alcohol prior to redressing.   Do not put a pillow under the knee. Place it under the heel.         DISCHARGE MEDICATIONS:  Current Discharge Medication List    START taking these medications   Details  enoxaparin (LOVENOX) 40 MG/0.4ML SOLN Inject 0.4 mLs (40 mg total) into the skin daily. Qty: 10 Syringe, Refills: 0    methocarbamol (ROBAXIN) 500 MG tablet Take 1 tablet (500 mg total) by mouth every 6 (six) hours as needed. Qty: 60 tablet, Refills: 0    oxyCODONE (OXY IR/ROXICODONE) 5 MG immediate release tablet Take 1-2 tablets (5-10 mg total) by mouth every 3 (three) hours as needed. Qty: 60 tablet, Refills: 0    oxyCODONE (OXYCONTIN) 10 MG 12 hr tablet Take 1 tablet (10 mg total) by mouth every 12 (twelve) hours. Qty: 30 tablet, Refills: 0      CONTINUE these medications which have NOT CHANGED   Details  Ascorbic Acid (VITAMIN C PO) Take 1 tablet by mouth daily.      fluticasone (FLONASE) 50  MCG/ACT nasal spray Place 2 sprays into the nose daily as needed. congestion     IRON-VITAMIN C PO Take 1 tablet by mouth daily.      levothyroxine (SYNTHROID, LEVOTHROID) 125 MCG  tablet Take 125 mcg by mouth daily.      montelukast (SINGULAIR) 10 MG tablet Take 10 mg by mouth at bedtime.      Multiple Vitamin (MULITIVITAMIN WITH MINERALS) TABS Take 1 tablet by mouth daily.      NONFORMULARY/COMPOUNDED ITEM Inject 1 each as directed every 30 (thirty) days. Vitamin-b12 injection     OVER THE COUNTER MEDICATION Take 1 tablet by mouth daily. Calcium 4000units     PSEUDOEPHEDRINE HCL PO Take 1 tablet by mouth 2 (two) times daily as needed. For congestion     Vitamin D, Ergocalciferol, (DRISDOL) 50000 UNITS CAPS Take 50,000 Units by mouth every 7 (seven) days. Tuesdays         FOLLOW UP VISIT:   Follow-up Information    Follow up with Raymon Mutton, MD. Call on 07/21/2011.   Contact information:   [address] [place] [address] [phone]          DISPOSITION:  Home    CONDITION:  Good   Jamas Jaquay 07/08/2011, 7:23 AM

## 2011-07-08 NOTE — Progress Notes (Signed)
Physical Therapy Treatment Patient Details Name: Kathleen Russo MRN: [id number] DOB: 1961-03-24 Today's Date: 07/08/2011  PT Assessment/Plan  PT - Assessment/Plan Comments on Treatment Session: Patient activity tolerance limited by headache this date. Patient able to safely manipulate 4 in step  to enter home. Spouse to provide 24/7 assist/supervision. Patient with improved gait and transfer ability this date. Patient safe to return home with spouse to assist PT Plan: Discharge plan remains appropriate PT Frequency: 7X/week Follow Up Recommendations: Home health PT  PT Goals  Acute Rehab PT Goals PT Goal: Supine/Side to Sit - Progress: Progressing toward goal PT Goal: Sit to Stand - Progress: Progressing toward goal PT Transfer Goal: Bed to Chair/Chair to Bed - Progress: Progressing toward goal PT Goal: Ambulate - Progress: Progressing toward goal PT Goal: Up/Down Stairs - Progress: Met  PT Treatment Precautions/Restrictions  Precautions Precautions: Knee Required Braces or Orthoses: No Restrictions Weight Bearing Restrictions: Yes LLE Weight Bearing: Weight bearing as tolerated Mobility (including Balance) Bed Mobility Bed Mobility:  (patient received in chair)  Transfers Sit to Stand: 4: Min assist Sit to Stand Details (indicate cue type and reason): from chair, increased time Ambulation/Gait Ambulation/Gait: Yes Ambulation/Gait Assistance: 5: Supervision (contact guard assist) Ambulation Distance (Feet): 50 Feet (distance limited by pt c/o of 10/10 headache) Assistive device: Rolling walker Gait Pattern: Step-to pattern;Decreased step length - left;Decreased stance time - left;Antalgic Gait velocity: decreased cadence Stairs: Yes Stairs Assistance: 4: Min assist Stairs Assistance Details (indicate cue type and reason): asc/desc 1, 4 in step to mimic home set up Stair Management Technique: Backwards Number of Stairs: 1  Height of Stairs: 4  in   Exercise  Total  Joint Exercises Ankle Circles/Pumps: AROM;Both;10 reps;Seated Quad Sets: AROM;Left;10 reps;Seated (LE were elevated on leg rest) Short Arc Quad:  (attempted SAQ however patient unable d/t dec strength) Heel Slides: AAROM;10 reps;Seated End of Session PT - End of Session Equipment Utilized During Treatment: Gait belt Activity Tolerance:  (patient limited by headache) Patient left: in chair;with call bell in reach Nurse Communication: Mobility status for transfers (alerted RN to contact PT to assist with CPM when pt return to bed) General Behavior During Session: Lethargic Cognition: WFL for tasks performed  Marcene Brawn 07/08/2011, 11:57 AM  Lewis Shock, PT, DPT Pager #: [phone] Office #: [phone]

## 2011-07-08 NOTE — Progress Notes (Signed)
Occupational Therapy Treatment Patient Details Name: Kathleen Russo MRN: [id number] DOB: 05/15/1961 Today's Date: 07/08/2011  OT Assessment/Plan OT Assessment/Plan Comments on Treatment Session: Pt. anticipates D/C home today. OT Plan: Discharge plan remains appropriate OT Frequency: Min 2X/week Follow Up Recommendations: Home health OT;Supervision - Intermittent Equipment Recommended: None recommended by OT OT Goals Acute Rehab OT Goals OT Goal Formulation: With patient Time For Goal Achievement: 7 days ADL Goals Pt Will Perform Lower Body Bathing: with modified independence;Sit to stand from bed ADL Goal: Lower Body Bathing - Progress: Not met Pt Will Perform Lower Body Dressing: with modified independence;with adaptive equipment;Sit to stand from bed ADL Goal: Lower Body Dressing - Progress: Not met Pt Will Transfer to Toilet: with modified independence;Ambulation;3-in-1 ADL Goal: Toilet Transfer - Progress: Progressing toward goals Pt Will Perform Tub/Shower Transfer: with supervision;with DME;Ambulation;Shower transfer;Shower seat with back ADL Goal: Web designer - Progress: Progressing toward goals  OT Treatment Precautions/Restrictions  Precautions Precautions: Knee Required Braces or Orthoses: No Restrictions Weight Bearing Restrictions: Yes LLE Weight Bearing: Weight bearing as tolerated   ADL ADL Toilet Transfer: Performed;Minimal assistance Toilet Transfer Details (indicate cue type and reason): Min verbal cues for hand placement Toilet Transfer Method: Freight forwarder Equipment: Bedside commode;Other (comment) (over toilet) Toileting - Clothing Manipulation: Performed;Minimal assistance Toileting - Clothing Manipulation Details (indicate cue type and reason): with moving gown Where Assessed - Toileting Clothing Manipulation: Standing Toileting - Hygiene: Performed;Set up Where Assessed - Toileting Hygiene: Standing Tub/Shower Transfer:  Performed;Minimal assistance Tub/Shower Transfer Details (indicate cue type and reason): With step into shower with use of RW and min verbal cues for hand placement for entering in backwards. Tub/Shower Transfer Method: Ecologist with back;Grab bars;Walk in shower Equipment Used: Rolling walker Ambulation Related to ADLs: Pt. supervision ~10' with RW ADL Comments: Pt. educated on shower transfer and LB ADLs. Mobility  Bed Mobility Bed Mobility: Yes Supine to Sit: 3: Mod assist;With rails;HOB elevated (Comment degrees) Supine to Sit Details (indicate cue type and reason): For L LE management     End of Session OT - End of Session Equipment Utilized During Treatment: Gait belt Activity Tolerance: Patient limited by pain Patient left: in chair;with call bell in reach;Other (comment);with family/visitor present Nurse Communication: Mobility status for transfers General Behavior During Session: [facility] for tasks performed Cognition: [facility] for tasks performed  Allysha Tryon, OTR/L Pager [phone]  07/08/2011, 10:57 AM

## 2014-04-03 DIAGNOSIS — N3281 Overactive bladder: Secondary | ICD-10-CM | POA: Insufficient documentation

## 2014-04-03 DIAGNOSIS — B3731 Acute candidiasis of vulva and vagina: Secondary | ICD-10-CM | POA: Insufficient documentation

## 2014-04-03 DIAGNOSIS — B373 Candidiasis of vulva and vagina: Secondary | ICD-10-CM | POA: Insufficient documentation

## 2015-06-13 DIAGNOSIS — K610 Anal abscess: Secondary | ICD-10-CM | POA: Insufficient documentation

## 2016-03-11 DIAGNOSIS — R6882 Decreased libido: Secondary | ICD-10-CM | POA: Insufficient documentation

## 2017-01-28 DIAGNOSIS — M25552 Pain in left hip: Secondary | ICD-10-CM

## 2017-01-28 DIAGNOSIS — G8929 Other chronic pain: Secondary | ICD-10-CM | POA: Insufficient documentation

## 2017-03-23 LAB — HM COLONOSCOPY

## 2017-05-12 DIAGNOSIS — M1612 Unilateral primary osteoarthritis, left hip: Secondary | ICD-10-CM | POA: Insufficient documentation

## 2017-05-22 HISTORY — PX: OTHER SURGICAL HISTORY: SHX169

## 2017-06-08 DIAGNOSIS — E119 Type 2 diabetes mellitus without complications: Secondary | ICD-10-CM | POA: Insufficient documentation

## 2017-06-08 DIAGNOSIS — E1159 Type 2 diabetes mellitus with other circulatory complications: Secondary | ICD-10-CM | POA: Insufficient documentation

## 2017-06-08 DIAGNOSIS — E669 Obesity, unspecified: Secondary | ICD-10-CM | POA: Insufficient documentation

## 2017-06-08 DIAGNOSIS — E1169 Type 2 diabetes mellitus with other specified complication: Secondary | ICD-10-CM | POA: Insufficient documentation

## 2017-06-08 DIAGNOSIS — I10 Essential (primary) hypertension: Secondary | ICD-10-CM | POA: Insufficient documentation

## 2017-06-08 DIAGNOSIS — E039 Hypothyroidism, unspecified: Secondary | ICD-10-CM | POA: Insufficient documentation

## 2017-08-27 DIAGNOSIS — Z96642 Presence of left artificial hip joint: Secondary | ICD-10-CM

## 2017-08-27 HISTORY — DX: Presence of left artificial hip joint: Z96.642

## 2018-03-29 ENCOUNTER — Ambulatory Visit: Payer: BC Managed Care – PPO | Admitting: Certified Nurse Midwife

## 2018-03-29 ENCOUNTER — Other Ambulatory Visit (HOSPITAL_COMMUNITY)
Admission: RE | Admit: 2018-03-29 | Discharge: 2018-03-29 | Disposition: A | Discharge status: Home / Self Care | Payer: BC Managed Care – PPO | Source: Ambulatory Visit | Attending: Obstetrics & Gynecology | Admitting: Obstetrics & Gynecology

## 2018-03-29 ENCOUNTER — Encounter: Payer: Self-pay | Admitting: Certified Nurse Midwife

## 2018-03-29 ENCOUNTER — Other Ambulatory Visit: Payer: Self-pay

## 2018-03-29 VITALS — BP 118/64 | HR 70 | Resp 16 | Ht 66.0 in | Wt 250.0 lb

## 2018-03-29 DIAGNOSIS — L900 Lichen sclerosus et atrophicus: Secondary | ICD-10-CM

## 2018-03-29 DIAGNOSIS — Z124 Encounter for screening for malignant neoplasm of cervix: Secondary | ICD-10-CM | POA: Insufficient documentation

## 2018-03-29 DIAGNOSIS — Z01411 Encounter for gynecological examination (general) (routine) with abnormal findings: Secondary | ICD-10-CM | POA: Diagnosis not present

## 2018-03-29 DIAGNOSIS — Z0000 Encounter for general adult medical examination without abnormal findings: Secondary | ICD-10-CM | POA: Diagnosis not present

## 2018-03-29 DIAGNOSIS — N393 Stress incontinence (female) (male): Secondary | ICD-10-CM

## 2018-03-29 DIAGNOSIS — Z23 Encounter for immunization: Secondary | ICD-10-CM

## 2018-03-29 DIAGNOSIS — Z8542 Personal history of malignant neoplasm of other parts of uterus: Secondary | ICD-10-CM

## 2018-03-29 LAB — POCT URINALYSIS DIPSTICK
Bilirubin, UA: NEGATIVE
Blood, UA: NEGATIVE
GLUCOSE UA: NEGATIVE
Ketones, UA: NEGATIVE
LEUKOCYTES UA: NEGATIVE
NITRITE UA: NEGATIVE
PROTEIN UA: NEGATIVE
Urobilinogen, UA: NEGATIVE E.U./dL — AB
pH, UA: 5 (ref 5.0–8.0)

## 2018-03-29 NOTE — Patient Instructions (Signed)
EXERCISE AND DIET:  We recommended that you start or continue a regular exercise program for good health. Regular exercise means any activity that makes your heart beat faster and makes you sweat.  We recommend exercising at least 30 minutes per day at least 3 days a week, preferably 4 or 5.  We also recommend a diet low in fat and sugar.  Inactivity, poor dietary choices and obesity can cause diabetes, heart attack, stroke, and kidney damage, among others.    ALCOHOL AND SMOKING:  Women should limit their alcohol intake to no more than 7 drinks/beers/glasses of wine (combined, not each!) per week. Moderation of alcohol intake to this level decreases your risk of breast cancer and liver damage. And of course, no recreational drugs are part of a healthy lifestyle.  And absolutely no smoking or even second hand smoke. Most people know smoking can cause heart and lung diseases, but did you know it also contributes to weakening of your bones? Aging of your skin?  Yellowing of your teeth and nails?  CALCIUM AND VITAMIN D:  Adequate intake of calcium and Vitamin D are recommended.  The recommendations for exact amounts of these supplements seem to change often, but generally speaking 600 mg of calcium (either carbonate or citrate) and 800 units of Vitamin D per day seems prudent. Certain women may benefit from higher intake of Vitamin D.  If you are among these women, your doctor will have told you during your visit.    PAP SMEARS:  Pap smears, to check for cervical cancer or precancers,  have traditionally been done yearly, although recent scientific advances have shown that most women can have pap smears less often.  However, every woman still should have a physical exam from her gynecologist every year. It will include a breast check, inspection of the vulva and vagina to check for abnormal growths or skin changes, a visual exam of the cervix, and then an exam to evaluate the size and shape of the uterus and  ovaries.  And after 58 years of age, a rectal exam is indicated to check for rectal cancers. We will also provide age appropriate advice regarding health maintenance, like when you should have certain vaccines, screening for sexually transmitted diseases, bone density testing, colonoscopy, mammograms, etc.   MAMMOGRAMS:  All women over 40 years old should have a yearly mammogram. Many facilities now offer a "3D" mammogram, which may cost around $50 extra out of pocket. If possible,  we recommend you accept the option to have the 3D mammogram performed.  It both reduces the number of women who will be called back for extra views which then turn out to be normal, and it is better than the routine mammogram at detecting truly abnormal areas.    COLONOSCOPY:  Colonoscopy to screen for colon cancer is recommended for all women at age 50.  We know, you hate the idea of the prep.  We agree, BUT, having colon cancer and not knowing it is worse!!  Colon cancer so often starts as a polyp that can be seen and removed at colonscopy, which can quite literally save your life!  And if your first colonoscopy is normal and you have no family history of colon cancer, most women don't have to have it again for 10 years.  Once every ten years, you can do something that may end up saving your life, right?  We will be happy to help you get it scheduled when you are ready.    Be sure to check your insurance coverage so you understand how much it will cost.  It may be covered as a preventative service at no cost, but you should check your particular policy.      Atrophic Vaginitis Atrophic vaginitis is when the tissues that line the vagina become dry and thin. This is caused by a drop in estrogen. Estrogen helps:  To keep the vagina moist.  To make a clear fluid that helps: ? To lubricate the vagina for sex. ? To protect the vagina from infection.  If the lining of the vagina is dry and thin, it may:  Make sex painful. It  may also cause bleeding.  Cause a feeling of: ? Burning. ? Irritation. ? Itchiness.  Make an exam of your vagina painful. It may also cause bleeding.  Make you lose interest in sex.  Cause a burning feeling when you pee.  Make your vaginal fluid (discharge) brown or yellow.  For some women, there are no symptoms. This condition is most common in women who do not get their regular menstrual periods anymore (menopause). This often starts when a woman is 45-55 years old. Follow these instructions at home:  Take medicines only as told by your doctor. Do not use any herbal or alternative medicines unless your doctor says it is okay.  Use over-the-counter products for dryness only as told by your doctor. These include: ? Creams. ? Lubricants. ? Moisturizers.  Do not douche.  Do not use products that can make your vagina dry. These include: ? Scented feminine sprays. ? Scented tampons. ? Scented soaps.  If it hurts to have sex, tell your sexual partner. Contact a doctor if:  Your discharge looks different than normal.  Your vagina has an unusual smell.  You have new symptoms.  Your symptoms do not get better with treatment.  Your symptoms get worse. This information is not intended to replace advice given to you by your health care provider. Make sure you discuss any questions you have with your health care provider. Document Released: 11/25/2007 Document Revised: 11/14/2015 Document Reviewed: 05/30/2014 [employer] Interactive Patient Education  2018 [employer]  

## 2018-03-29 NOTE — Progress Notes (Signed)
57 y.o. [id number] Married  Caucasian Fe here to establish gyn exam and for annual exam. Was seen for many years by Dr. Franki Cabot) who has now retired.Patient was diagnosed with uterine cancer and had TAH with BSO at [facility] 20 years ago. Lumpectomy left breast benign (1979). History joint issues with bilateral knee and hip replacement. Sees Reinaldo Meeker for hypothyroid and hypertension and diabetes, Vitamin D and labs management yearly and prn. Has had chronic history of Lichen Sclerosis for several years and using Clobetasol ointment as needed to treat. Feels like she has incontinence at times if holds urine to long. She has noted vaginal dryness at times, but not treating. Gastric Bypass history 9 years ago has maintained current weight. No other health issues today.  No LMP recorded. Patient has had a hysterectomy.          Sexually active: Yes.    The current method of family planning is status post hysterectomy.    Exercising: No.  exercise Smoker:  no  Review of Systems  Constitutional: Negative.   HENT: Negative.   Eyes: Negative.   Respiratory: Negative.   Cardiovascular: Negative.   Gastrointestinal: Negative.   Genitourinary:       Burning at the end of urination  Musculoskeletal: Negative.   Skin: Negative.   Neurological: Negative.   Endo/Heme/Allergies: Negative.   Psychiatric/Behavioral: Negative.     Health Maintenance: Pap:  2018 neg  History of Abnormal Pap: no MMG:  03-22-18  Self Breast exams: yes Colonoscopy:  2018 f/u 40yrs BMD:   Done yrs ago TDaP:  Unsure greater than 10 years Shingles: not done Pneumonia: not done Hep C and HIV: not done Labs: poct urine-neg   reports that she has never smoked. She has never used smokeless tobacco. She reports that she does not drink alcohol or use drugs.  Past Medical History:  Diagnosis Date  . Anemia   . Arthritis   . Cancer (HCC)    UTERINE  . Diabetes mellitus    DM2, not requiring meds  as of 04/2011  . Dizziness   . Hemorrhoids   . Hypertension    not requiring meds as of 04/2011  . Hypothyroidism   . PONV (postoperative nausea and vomiting)   . Sleep apnea    USES CPAP  . Swelling of both ankles     Past Surgical History:  Procedure Laterality Date  . ABDOMINAL HYSTERECTOMY    . BREAST SURGERY  1979  . ELBOW SURGERY  1990'S  . GASTRIC BYPASS  2007  . JOINT REPLACEMENT  2002   RT. KNEE  . KNEE ARTHROSCOPY  1993   LT. KNEE  . KNEE ARTHROSCOPY W/ LATERAL RELEASE  1987   RT. KNEE  . TONSILLECTOMY    . TOTAL KNEE ARTHROPLASTY  07/06/2011   Procedure: TOTAL KNEE ARTHROPLASTY;  Surgeon: Rudean Haskell, MD;  Location: [facility];  Service: Orthopedics;  Laterality: Left;    Current Outpatient Medications  Medication Sig Dispense Refill  . betamethasone valerate ointment (VALISONE) 0.1 % Apply 1 application topically 2 (two) times daily.    . fluticasone (FLONASE) 50 MCG/ACT nasal spray Place 2 sprays into the nose daily as needed. congestion     . levothyroxine (SYNTHROID, LEVOTHROID) 125 MCG tablet Take 125 mcg by mouth daily.      . montelukast (SINGULAIR) 10 MG tablet Take 10 mg by mouth at bedtime.      . NONFORMULARY/COMPOUNDED ITEM Inject 1 each as  directed every 30 (thirty) days. Vitamin-b12 injection     . Vitamin D, Ergocalciferol, (DRISDOL) 50000 UNITS CAPS Take 50,000 Units by mouth every 7 (seven) days. Tuesdays      No current facility-administered medications for this visit.     Family History  Problem Relation Age of Onset  . Hypertension Mother   . Thyroid disease Mother   . Diabetes Mother   . Other Father        Wegoners disease  . Thyroid disease Sister   . Graves' disease Brother     ROS:  Pertinent items are noted in HPI.  Otherwise, a comprehensive ROS was negative.  Exam:   BP 118/64   Pulse 70   Resp 16   Ht 5\' 6"  (1.676 m)   Wt 250 lb (113.4 kg)   BMI 40.35 kg/m  Height: 5\' 6"  (167.6 cm) Ht Readings from Last 3 Encounters:   03/29/18 5\' 6"  (1.676 m)  06/25/11 5\' 5"  (1.651 m)    General appearance: alert, cooperative and appears stated age Head: Normocephalic, without obvious abnormality, atraumatic Neck: no adenopathy, supple, symmetrical, trachea midline and thyroid normal to inspection and palpation Lungs: clear to auscultation bilaterally Breasts: normal appearance, no masses or tenderness, No nipple retraction or dimpling, No nipple discharge or bleeding, No axillary or supraclavicular adenopathy, left breast lumpectomy scarring Heart: regular rate and rhythm Abdomen: soft, non-tender; no masses,  no organomegaly Extremities: extremities normal, atraumatic, no cyanosis or edema Skin: Skin color, texture, turgor normal. No rashes or lesions Lymph nodes: Cervical, supraclavicular, and axillary nodes normal. No abnormal inguinal nodes palpated Neurologic: Grossly normal   Pelvic: External genitalia:  no lesions, normal female with atrophy and Lichen sclerosis flare noted in clitoral hood and surrounding area, with pigmentation changes. Area shown to patient in mirror              Urethra:  normal appearing urethra with no masses, tenderness or lesions              Bartholin's and Skene's: normal                 Vagina:dry appearing vagina with normal color and  Scant discharge, no lesions  Rectocele grade 1              Cervix: absent              Pap taken: Yes.   Bimanual Exam:  Uterus:  uterus absent              Adnexa: no mass, fullness, tenderness and adnexal surgically removed               Rectovaginal: Confirms               Anus:  normal sphincter tone, no lesions, Lichen Sclerosis noted at top of anal area. Shown to patient in mirror.  Chaperone present: yes  A:  Well Woman with normal exam  Menopausal no HRT S/P TAH with BSO due to uterine cancer  Vaginal dryness  Lichen Sclerosis flare  Spontaneous urinary incontinence, R/O UTI  Obesity previous gastric bypass  Rectocele not  symptomatic  Hypothyroid, Diabetes Type 2, Hypertension with PCP management  Immunization update  P:   Reviewed health and wellness pertinent to exam  Aware that Pap smear should be done yearly, unless records (requested) specify otherwise( she was told yearly)  Discussed vaginal dryness finding and can encourage urine incontinence. Discussed OTC coconut oil or Olive  oil. Instructions given for trial. Will advise if no change  Lab Urine culture  Discussed Lichen Sclerosis flare and due to area involved this could be causing urinary urgency. Shown area in mirror to treat with Clobetasol ointment.  Rx Clobetasol bid to affected area bid x 4 weeks and then once daily for 2 weeks.   Recheck area in 6 weeks  Patient working on diet and exercise as joints allow  Continue follow up as indicated per PCP  TDAP requested  Pap smear: yes   counseled on breast self exam, mammography screening, feminine hygiene, adequate intake of calcium and vitamin D, diet and exercise, Kegel's exercises  return annually or prn  An After Visit Summary was printed and given to the patient.

## 2018-03-31 LAB — CYTOLOGY - PAP
DIAGNOSIS: NEGATIVE
HPV (WINDOPATH): NOT DETECTED

## 2018-04-01 ENCOUNTER — Other Ambulatory Visit: Payer: Self-pay | Admitting: Certified Nurse Midwife

## 2018-04-01 LAB — URINE CULTURE

## 2018-04-01 MED ORDER — SULFAMETHOXAZOLE-TRIMETHOPRIM 800-160 MG PO TABS: ORAL_TABLET | ORAL | 0 refills | Status: DC

## 2018-05-10 ENCOUNTER — Other Ambulatory Visit: Payer: Self-pay

## 2018-05-10 ENCOUNTER — Encounter: Payer: Self-pay | Admitting: Certified Nurse Midwife

## 2018-05-10 ENCOUNTER — Ambulatory Visit: Payer: BC Managed Care – PPO | Admitting: Certified Nurse Midwife

## 2018-05-10 VITALS — BP 118/68 | HR 72 | Resp 16 | Wt 250.0 lb

## 2018-05-10 DIAGNOSIS — L900 Lichen sclerosus et atrophicus: Secondary | ICD-10-CM | POA: Diagnosis not present

## 2018-05-10 MED ORDER — CLOBETASOL PROPIONATE 0.05 % EX OINT: 1.0000 "application " | TOPICAL_OINTMENT | Freq: Two times a day (BID) | CUTANEOUS | 1 refills | Status: DC

## 2018-05-10 NOTE — Progress Notes (Signed)
Subjective:     Patient ID: Kathleen Russo, female   DOB: 1960/11/05, 57 y.o.   MRN: [id number]  HPI 57 yo white 57 y.o. female here for follow up for Lichen Sclerosis noted at [facility] on 03/29/18. Treating with Clobetasol twice daily for four weeks and now once daily. Feels this has improved the itching but not sure it is resolved. Occasional vaginal dryness, no issues. Having no problems with medication use. No other health issues today.   Review of Systems  Constitutional: Negative.   HENT: Negative.   Eyes: Negative.   Respiratory: Negative.   Cardiovascular: Negative.   Gastrointestinal: Negative.   Endocrine: Negative.   Genitourinary: Negative.        No vulva itching  Musculoskeletal: Negative.   Skin:       Hopeful skin in vaginal area better  Allergic/Immunologic: Negative.   Neurological: Negative.   Hematological: Negative.   Psychiatric/Behavioral: Negative.        Objective:   Physical Exam  Constitutional: She is oriented to person, place, and time. She appears well-developed and well-nourished.  Genitourinary:     Neurological: She is alert and oriented to person, place, and time.  Skin: Skin is warm and dry.  Psychiatric: She has a normal mood and affect. Her behavior is normal. Judgment and thought content normal.       Assessment:     Lichen Sclerosis of vulva responding well to Clobetasol ointment    Plan:      Discussed and shown area of concern in vulva area to patient and improvement noted. Instructed to continue once daily treatment unless itching occurs. Will advise. Rx Clobetasol ointment, see order with instructions.  Rv 3 weeks, prn

## 2018-05-31 ENCOUNTER — Other Ambulatory Visit: Payer: Self-pay

## 2018-05-31 ENCOUNTER — Ambulatory Visit: Payer: BC Managed Care – PPO | Admitting: Certified Nurse Midwife

## 2018-05-31 ENCOUNTER — Encounter: Payer: Self-pay | Admitting: Certified Nurse Midwife

## 2018-05-31 VITALS — BP 104/70 | HR 70 | Resp 16 | Wt 255.0 lb

## 2018-05-31 DIAGNOSIS — L900 Lichen sclerosus et atrophicus: Secondary | ICD-10-CM

## 2018-05-31 NOTE — Progress Notes (Signed)
Subjective:     Patient ID: Kathleen Russo, female   DOB: November 21, 1960, 57 y.o.   MRN: [id number]  57 yo white female here for recheck of Lichen Sclerosis, treating with Clobetasol ointment once daily, with no issues originally noted at aex on 03/29/18. Itching has resolved and "feels so much better". No other health issues today.    Review of Systems  Constitutional: Negative.   HENT: Negative.   Eyes: Negative.   Respiratory: Negative.   Cardiovascular: Negative.   Gastrointestinal: Negative.   Endocrine: Negative.   Genitourinary: Negative for genital sores, pelvic pain, vaginal bleeding, vaginal discharge and vaginal pain.       Lichen is better  Allergic/Immunologic: Negative.   Neurological: Negative.   Hematological: Negative.   Psychiatric/Behavioral: Negative.        Objective:   Physical Exam  Constitutional: She appears well-developed and well-nourished.  Genitourinary: Rectum normal.    Pelvic exam was performed with patient supine. There is no rash, tenderness or lesion on the right labia. There is no rash, tenderness or lesion on the left labia.  Lymphadenopathy: No inguinal adenopathy noted on the right or left side.  Skin: Skin is warm and dry.  Psychiatric: She has a normal mood and affect. Her speech is normal and behavior is normal. Judgment and thought content normal. Cognition and memory are normal.       Assessment:     Lichen Sclerosis resolved essentially, not symptomatic    Plan:     Discussed finding with patient and if she notes any of the characteristic itching as she had before, to start clobetasol twice daily for one month and then once daily for 2-4 weeks as before. Schedule appointment to check if occurs. Questions addressed.   Rv prn

## 2018-05-31 NOTE — Patient Instructions (Signed)
Apply Clobetasol ointment once daily now for 2 weeks, check area, if resolved stop medication

## 2019-03-23 ENCOUNTER — Other Ambulatory Visit: Payer: Self-pay | Admitting: Family Medicine

## 2019-03-23 DIAGNOSIS — Z1231 Encounter for screening mammogram for malignant neoplasm of breast: Secondary | ICD-10-CM

## 2019-04-28 ENCOUNTER — Other Ambulatory Visit: Payer: Self-pay

## 2019-05-02 ENCOUNTER — Ambulatory Visit: Payer: BC Managed Care – PPO | Admitting: Certified Nurse Midwife

## 2019-05-02 ENCOUNTER — Encounter: Payer: Self-pay | Admitting: Certified Nurse Midwife

## 2019-05-02 ENCOUNTER — Other Ambulatory Visit: Payer: Self-pay

## 2019-05-02 VITALS — BP 120/72 | HR 68 | Temp 97.1°F | Resp 16 | Ht 65.25 in | Wt 259.0 lb

## 2019-05-02 DIAGNOSIS — Z01419 Encounter for gynecological examination (general) (routine) without abnormal findings: Secondary | ICD-10-CM | POA: Diagnosis not present

## 2019-05-02 NOTE — Progress Notes (Signed)
58 y.o. [id number] Married  Caucasian Fe here for annual exam. Sees PCP Dr. Tobie Poet for medication management for allergies, hypothyroid, hypertension, glucose management, now on Synjardy, under follow up. Recent yeast infection treatment with Diflucan with PCP, feels resolved now. Occasional vaginal dryness. No other health concerns today.  No LMP recorded. Patient has had a hysterectomy.          Sexually active: Yes.    The current method of family planning is status post hysterectomy for early stage uterine cancer, no other involvement. Exercising: Yes.    walking Smoker:  no  Review of Systems  Constitutional: Negative.   HENT: Negative.   Eyes: Negative.   Respiratory: Negative.   Cardiovascular: Negative.   Gastrointestinal: Negative.   Genitourinary: Negative.   Musculoskeletal: Negative.   Skin: Negative.   Neurological: Negative.   Endo/Heme/Allergies: Negative.   Psychiatric/Behavioral: Negative.     Health Maintenance: Pap: 03-29-18 neg HPV HR neg History of Abnormal Pap: no, history of uterine cancer early stage contained  MMG:  03-22-18 normal per patient Self Breast exams: yes Colonoscopy:  2018 f/u 44yrs BMD:   Done yrs ago TDaP:  2019 Shingles: not done Pneumonia: not done Hep C and HIV: not done Labs: yes   reports that she has never smoked. She has never used smokeless tobacco. She reports that she does not drink alcohol or use drugs.  Past Medical History:  Diagnosis Date  . Anemia   . Arthritis   . Cancer (HCC)    UTERINE  . Diabetes mellitus    DM2, not requiring meds as of 04/2011  . Dizziness   . Hemorrhoids   . Hypertension    not requiring meds as of 04/2011  . Hypothyroidism   . Lichen sclerosus   . PONV (postoperative nausea and vomiting)   . Sleep apnea    USES CPAP  . Swelling of both ankles     Past Surgical History:  Procedure Laterality Date  . ABDOMINAL HYSTERECTOMY    . BREAST SURGERY  1979  . ELBOW SURGERY  1990'S  . GASTRIC  BYPASS  2007  . JOINT REPLACEMENT  2002   RT. KNEE  . KNEE ARTHROSCOPY  1993   LT. KNEE  . KNEE ARTHROSCOPY W/ LATERAL RELEASE  1987   RT. KNEE  . left hip replacement  05/2017  . TONSILLECTOMY    . TOTAL KNEE ARTHROPLASTY  07/06/2011   Procedure: TOTAL KNEE ARTHROPLASTY;  Surgeon: Rudean Haskell, MD;  Location: [facility];  Service: Orthopedics;  Laterality: Left;    Current Outpatient Medications  Medication Sig Dispense Refill  . betamethasone valerate ointment (VALISONE) 0.1 % Apply 1 application topically 2 (two) times daily.    . clobetasol ointment (TEMOVATE) [id number] % Apply 1 application topically 2 (two) times daily. Apply one thinly to affected area twice daily for 4 weeks and then once daily for 2 weeks 60 g 1  . fluticasone (FLONASE) 50 MCG/ACT nasal spray Place 2 sprays into the nose daily as needed. congestion     . levothyroxine (SYNTHROID, LEVOTHROID) 125 MCG tablet Take 125 mcg by mouth daily.      Marland Kitchen lisinopril-hydrochlorothiazide (PRINZIDE,ZESTORETIC) 10-12.5 MG tablet Take 1 tablet by mouth daily.    . montelukast (SINGULAIR) 10 MG tablet Take 10 mg by mouth at bedtime.      . NONFORMULARY/COMPOUNDED ITEM Inject 1 each as directed every 30 (thirty) days. Vitamin-b12 injection     . VICTOZA 18 MG/3ML SOPN  No current facility-administered medications for this visit.     Family History  Problem Relation Age of Onset  . Hypertension Mother   . Thyroid disease Mother   . Diabetes Mother   . Other Father        Wegoners disease  . Thyroid disease Sister   . Graves' disease Brother     ROS:  Pertinent items are noted in HPI.  Otherwise, a comprehensive ROS was negative.  Exam:   BP 120/72   Pulse 68   Temp (!) 97.1 F (36.2 C) (Skin)   Resp 16   Ht 5' 5.25" (1.657 m)   Wt 259 lb (117.5 kg)   BMI 42.77 kg/m  Height: 5' 5.25" (165.7 cm) Ht Readings from Last 3 Encounters:  05/02/19 5' 5.25" (1.657 m)  03/29/18 5\' 6"  (1.676 m)  06/25/11 5\' 5"  (1.651 m)     General appearance: alert, cooperative and appears stated age Head: Normocephalic, without obvious abnormality, atraumatic Neck: no adenopathy, supple, symmetrical, trachea midline and thyroid normal to inspection and palpation Lungs: clear to auscultation bilaterally Breasts: normal appearance, no masses or tenderness, No nipple retraction or dimpling, No nipple discharge or bleeding, No axillary or supraclavicular adenopathy Heart: regular rate and rhythm Abdomen: soft, non-tender; no masses,  no organomegaly Extremities: extremities normal, atraumatic, no cyanosis or edema Skin: Skin color, texture, turgor normal. No rashes or lesions Lymph nodes: Cervical, supraclavicular, and axillary nodes normal. No abnormal inguinal nodes palpated Neurologic: Grossly normal   Pelvic: External genitalia:  no lesions              Urethra:  normal appearing urethra with no masses, tenderness or lesions              Bartholin's and Skene's: normal                 Vagina: normal appearing vagina with normal color and discharge, no lesions              Cervix: absent              Pap taken: No. Bimanual Exam:  Uterus:  uterus absent              Adnexa: surgically absent, no fullness               Rectovaginal: Confirms               Anus:  normal sphincter tone, no lesions  Chaperone present: yes  A:  Well Woman with normal exam  Menopausal no HRT  History of uterine cancer with TAH with BSO  Lichen sclerosis no flares in the past 6 months  Glucose, hypertension, hypothyroid with PCP management, all stable per patient  P:   Reviewed health and wellness pertinent to exam  Aware of need to advise if vaginal bleeding or Lichen flare and medication not controlling. Has Rx.  Continue to follow up with MD as indicated.  Encouraged to work on [employer] loss management for better health.  Pap smear: no (patient request every other year)   counseled on breast self exam, mammography screening,  feminine hygiene, adequate intake of calcium and vitamin D, diet and exercise, Kegel's exercises  return annually or prn  An After Visit Summary was printed and given to the patient.

## 2019-05-02 NOTE — Patient Instructions (Signed)

## 2019-05-09 ENCOUNTER — Ambulatory Visit: Payer: BC Managed Care – PPO

## 2019-05-09 ENCOUNTER — Other Ambulatory Visit: Payer: Self-pay

## 2019-05-09 ENCOUNTER — Ambulatory Visit
Admission: RE | Admit: 2019-05-09 | Discharge: 2019-05-09 | Disposition: A | Discharge status: Home / Self Care | Payer: BC Managed Care – PPO | Source: Ambulatory Visit | Attending: Family Medicine | Admitting: Family Medicine

## 2019-05-09 DIAGNOSIS — Z1231 Encounter for screening mammogram for malignant neoplasm of breast: Secondary | ICD-10-CM

## 2019-07-25 ENCOUNTER — Other Ambulatory Visit: Payer: Self-pay | Admitting: Family Medicine

## 2019-09-11 ENCOUNTER — Encounter: Payer: Self-pay | Admitting: Certified Nurse Midwife

## 2019-09-25 ENCOUNTER — Encounter: Payer: Self-pay | Admitting: Family Medicine

## 2019-09-25 NOTE — Progress Notes (Signed)
Established Patient Office Visit  Subjective:  Patient ID: Kathleen Russo, female    DOB: 1960/12/31  Age: 59 y.o. MRN: [id number]  CC:  Chief Complaint  Patient presents with  . Hyperlipidemia  . Hypertension  . Hypothyroidism  . Gastroesophageal Reflux    HPI Patient presents with type 2 diabetes mellitus with other specified complication.  Specifically, this is type 2, non-insulin requiring diabetes, complicated by obesity, hyperlipidemia, and hypertension.  Date of diagnosis 46.  Compliance with treatment has been fair; she skips some medication doses due to forgetfulness and not checking sugar daily. Eating fair. Exercising some..  She follows a prescribed diet.  She specifically denies associated symptoms, including fatigue, excessive thirst, excessively hunger and frequent urination.  Eating fairly healthy and  exercising. Daughter diagnosed with carrier for hemochromatosis. Tobacco screen: Non-smoker.  Current meds include an oral hypoglycemic ( Glucophage ( 1000mg  bid ) ), insulin/injectable ( victoza ), and Prinivil.  She reports home blood glucose readings have been a bit high, with average fasting readings in the 140s mg/dL range. She checks her glucose 3 times per week.  In regard to preventative care, she performs foot self-exams daily and her last ophthalmology exam was in 03/2018.      Pt presents with hyperlipidemia.  She cannot recall when the diagnosis of hypercholesterolemia was made.  Current treatment includes Crestor and a low cholesterol/low fat diet.  She is maintaining her exercise regimen.  She denies experiencing any hypercholesterolemia related symptoms.      Pt presents for follow up of hypertension.  She cannot recall when the diagnosis of hypertension was made.  Her current cardiac medication regimen includes lisinopril/hctz.   She is tolerating the medication well without side effects.  Compliance with treatment has been fair; she eats fairly healthy ,but  does  not exercise.  Hypothyroidism controlled on levothyroxine 137 mcg once daily in am.   Past Medical History:  Diagnosis Date  . Anemia   . Arthritis   . Cancer (HCC)    UTERINE  . Diabetes mellitus    DM2, not requiring meds as of 04/2011  . Dizziness   . GERD (gastroesophageal reflux disease)   . Hemorrhoids   . Hyperlipidemia   . Hypertension    not requiring meds as of 04/2011  . Hypothyroidism   . Lichen sclerosus   . PONV (postoperative nausea and vomiting)   . Primary osteoarthritis   . Sleep apnea    USES CPAP  . Swelling of both ankles   . Type 2 diabetes mellitus (HCC)     Past Surgical History:  Procedure Laterality Date  . ABDOMINAL HYSTERECTOMY    . BARIATRIC SURGERY    . BREAST LUMPECTOMY Left   . BREAST SURGERY  1979  . ELBOW SURGERY  1990'S  . GASTRIC BYPASS  2007  . JOINT REPLACEMENT  2002   RT. KNEE  . KNEE ARTHROSCOPY  1993   LT. KNEE  . KNEE ARTHROSCOPY W/ LATERAL RELEASE  1987   RT. KNEE  . left hip replacement  05/2017  . TONSILLECTOMY    . TOTAL KNEE ARTHROPLASTY  07/06/2011   Procedure: TOTAL KNEE ARTHROPLASTY;  Surgeon: Raymon Mutton, MD;  Location: [facility] OR;  Service: Orthopedics;  Laterality: Left;    Family History  Problem Relation Age of Onset  . Hypertension Mother   . Thyroid disease Mother   . Diabetes Mother   . Other Father  Wegoners disease  . Thyroid disease Sister   . Graves' disease Brother     Social History   Socioeconomic History  . Marital status: Married    Spouse name: Not on file  . Number of children: Not on file  . Years of education: Not on file  . Highest education level: Not on file  Occupational History  . Occupation: retired  Tobacco Use  . Smoking status: Never Smoker  . Smokeless tobacco: Never Used  Substance and Sexual Activity  . Alcohol use: No  . Drug use: No  . Sexual activity: Yes    Partners: Male    Birth control/protection: Surgical    Comment: hysterectomy  Other  Topics Concern  . Not on file  Social History Narrative  . Not on file   Social Determinants of Health   Financial Resource Strain:   . Difficulty of Paying Living Expenses:   Food Insecurity:   . Worried About Programme researcher, broadcasting/film/video in the Last Year:   . Barista in the Last Year:   Transportation Needs:   . Freight forwarder (Medical):   Marland Kitchen Lack of Transportation (Non-Medical):   Physical Activity:   . Days of Exercise per Week:   . Minutes of Exercise per Session:   Stress:   . Feeling of Stress :   Social Connections:   . Frequency of Communication with Friends and Family:   . Frequency of Social Gatherings with Friends and Family:   . Attends Religious Services:   . Active Member of Clubs or Organizations:   . Attends Banker Meetings:   Marland Kitchen Marital Status:   Intimate Partner Violence:   . Fear of Current or Ex-Partner:   . Emotionally Abused:   Marland Kitchen Physically Abused:   . Sexually Abused:     Outpatient Medications Prior to Visit  Medication Sig Dispense Refill  . cyclobenzaprine (FLEXERIL) 5 MG tablet Take 5 mg by mouth 3 (three) times daily as needed.    . etodolac (LODINE) 400 MG tablet Take 400 mg by mouth 2 (two) times daily.    Marland Kitchen levothyroxine (SYNTHROID) 137 MCG tablet Take 137 mcg by mouth daily.    Marland Kitchen lisinopril-hydrochlorothiazide (PRINZIDE,ZESTORETIC) 10-12.5 MG tablet Take 1 tablet by mouth daily.    . metFORMIN (GLUCOPHAGE) 1000 MG tablet TAKE ONE TABLET TWICE DAILY WITH MORNING AND EVENING MEAL 180 tablet 1  . montelukast (SINGULAIR) 10 MG tablet Take 10 mg by mouth at bedtime.      . polyethylene glycol (MIRALAX / GLYCOLAX) 17 g packet Take 17 g by mouth daily.    . rosuvastatin (CRESTOR) 10 MG tablet Take 10 mg by mouth daily.    Marland Kitchen VICTOZA 18 MG/3ML SOPN     . clobetasol ointment (TEMOVATE) 0.05 % Apply 1 application topically 2 (two) times daily. Apply one thinly to affected area twice daily for 4 weeks and then once daily for 2 weeks  60 g 1  . fluticasone (FLONASE) 50 MCG/ACT nasal spray Place 2 sprays into the nose daily as needed. congestion     . NONFORMULARY/COMPOUNDED ITEM Inject 1 each as directed every 30 (thirty) days. Vitamin-b12 injection     . SYNJARDY XR 12.10-998 MG TB24      No facility-administered medications prior to visit.    Allergies  Allergen Reactions  . Nsaids     Medications don't absorb  . Quinolones     Medications don't absorb  . Tolmetin Other (  See Comments)    THIS MEDICATION WILL NOT ABSORB D/T GBP Medications don't absorb     ROS Review of Systems  Constitutional: Negative for chills, fatigue and fever.  HENT: Negative for congestion, ear pain, rhinorrhea and sore throat.   Respiratory: Negative for cough and shortness of breath.   Cardiovascular: Negative for chest pain.  Gastrointestinal: Negative for abdominal pain, constipation, diarrhea, nausea and vomiting.  Endocrine: Negative for polydipsia, polyphagia and polyuria.  Genitourinary: Negative for dysuria and urgency.  Musculoskeletal: Positive for arthralgias. Negative for back pain and myalgias.       In hands. [facility] rheumatology. Her diagnosis is unclear. Pt needs to have an appointment.  Neurological: Positive for dizziness and headaches. Negative for weakness and light-headedness.       Occasional. Vertigo with laying down occasionally.   Psychiatric/Behavioral: Negative for dysphoric mood. The patient is not nervous/anxious.       Objective:    Physical Exam  Constitutional: She appears well-developed and well-nourished.  Cardiovascular: Normal rate, regular rhythm and normal heart sounds.  Pulmonary/Chest: Effort normal and breath sounds normal.  Abdominal: Soft. Bowel sounds are normal. There is no abdominal tenderness.  Neurological: She is alert.  Psychiatric: She has a normal mood and affect. Her behavior is normal.   Diabetic Foot Exam - Simple   Simple Foot Form Visual Inspection See  comments: Yes Sensation Testing Intact to touch and monofilament testing bilaterally: Yes Pulse Check Posterior Tibialis and Dorsalis pulse intact bilaterally: Yes Comments Flat feet BL. No calluses.    BP 118/70   Pulse 78   Temp (!) 97.5 F (36.4 C)   Resp 16   Ht 5\' 6"  (1.676 m)   Wt 259 lb (117.5 kg)   BMI 41.80 kg/m  Wt Readings from Last 3 Encounters:  09/26/19 259 lb (117.5 kg)  05/02/19 259 lb (117.5 kg)  05/31/18 255 lb (115.7 kg)     Health Maintenance Due  Topic Date Due  . HEMOGLOBIN A1C  Never done  . Hepatitis C Screening  Never done  . PNEUMOCOCCAL POLYSACCHARIDE VACCINE AGE 74-64 HIGH RISK  Never done  . FOOT EXAM  Never done  . OPHTHALMOLOGY EXAM  Never done  . HIV Screening  Never done  . COLONOSCOPY  02/02/2010    There are no preventive care reminders to display for this patient.  No results found for: TSH Lab Results  Component Value Date   WBC 13.4 (H) 07/08/2011   HGB 9.9 (L) 07/08/2011   HCT 31.1 (L) 07/08/2011   MCV 79.5 07/08/2011   PLT 248 07/08/2011   Lab Results  Component Value Date   NA 135 07/08/2011   K 3.8 07/08/2011   CO2 33 (H) 07/08/2011   GLUCOSE 167 (H) 07/08/2011   BUN 5 (L) 07/08/2011   CREATININE 0.49 (L) 07/08/2011   BILITOT 0.3 06/25/2011   ALKPHOS 112 06/25/2011   AST 14 06/25/2011   ALT 14 06/25/2011   PROT 7.4 06/25/2011   ALBUMIN 4.0 06/25/2011   CALCIUM 8.7 07/08/2011   No results found for: CHOL No results found for: HDL No results found for: LDLCALC No results found for: TRIG No results found for: CHOLHDL No results found for: ZOXW9U    Assessment & Plan:  1. Essential hypertension Well controlled.  No changes to medicines.  Continue to work on eating a healthy diet and exercise.  Labs drawn today.  - Comprehensive metabolic panel - CBC with Differential/Platelet  2. Hypothyroidism,  unspecified type The current medical regimen is effective;  continue present plan and medications. -  TSH  3. Combined hyperlipidemia associated with type 2 diabetes mellitus (HCC) Control: fair Recommend check sugars fasting daily. Recommend check feet daily. Recommend annual eye exams. Medicines: no changes unitl labs come back. Continue to work on eating a healthy diet and exercise.  Labs drawn today.  - Hemoglobin A1c - POCT UA - Microalbumin - blood glucose meter kit and supplies KIT; Dispense based on patient and insurance preference. Check daily in am. E11.69.  Dispense: 1 each; Refill: 0  4. Mixed hyperlipidemia Well controlled.  No changes to medicines.  Continue to work on eating a healthy diet and exercise.  Labs drawn today.  - Lipid panel  5. Morbid obesity (HCC) Recommend healthy diet and exercise. Recommend weight loss of 15 lbs by her next visit.  6. BMI 40.0-44.9, adult (HCC) See above.   Follow-up: Return in about 3 months (around 12/26/2019).    Blane Ohara, MD

## 2019-09-26 ENCOUNTER — Encounter: Payer: Self-pay | Admitting: Family Medicine

## 2019-09-26 ENCOUNTER — Other Ambulatory Visit: Payer: Self-pay

## 2019-09-26 ENCOUNTER — Ambulatory Visit: Payer: BC Managed Care – PPO | Admitting: Family Medicine

## 2019-09-26 VITALS — BP 118/70 | HR 78 | Temp 97.5°F | Resp 16 | Ht 66.0 in | Wt 259.0 lb

## 2019-09-26 DIAGNOSIS — E1169 Type 2 diabetes mellitus with other specified complication: Secondary | ICD-10-CM | POA: Diagnosis not present

## 2019-09-26 DIAGNOSIS — E782 Mixed hyperlipidemia: Secondary | ICD-10-CM | POA: Diagnosis not present

## 2019-09-26 DIAGNOSIS — E039 Hypothyroidism, unspecified: Secondary | ICD-10-CM

## 2019-09-26 DIAGNOSIS — I10 Essential (primary) hypertension: Secondary | ICD-10-CM | POA: Diagnosis not present

## 2019-09-26 DIAGNOSIS — Z6841 Body Mass Index (BMI) 40.0 and over, adult: Secondary | ICD-10-CM

## 2019-09-26 LAB — POCT UA - MICROALBUMIN: Microalbumin Ur, POC: 30 mg/L

## 2019-09-26 MED ORDER — BLOOD GLUCOSE MONITOR KIT: PACK | 0 refills | Status: DC

## 2019-09-26 NOTE — Patient Instructions (Signed)
Continue to work on diet and exercise.  Get glucometer and I recommend checking sugars once daily fasting.  Recommend continue to work on weight loss.  Recommend call [facility]umatology for follow up.    Diabetes Mellitus and Nutrition, Adult When you have diabetes (diabetes mellitus), it is very important to have healthy eating habits because your blood sugar (glucose) levels are greatly affected by what you eat and drink. Eating healthy foods in the appropriate amounts, at about the same times every day, can help you:  Control your blood glucose.  Lower your risk of heart disease.  Improve your blood pressure.  Reach or maintain a healthy weight. Every person with diabetes is different, and each person has different needs for a meal plan. Your health care provider may recommend that you work with a diet and nutrition specialist (dietitian) to make a meal plan that is best for you. Your meal plan may vary depending on factors such as:  The calories you need.  The medicines you take.  Your weight.  Your blood glucose, blood pressure, and cholesterol levels.  Your activity level.  Other health conditions you have, such as heart or kidney disease. How do carbohydrates affect me? Carbohydrates, also called carbs, affect your blood glucose level more than any other type of food. Eating carbs naturally raises the amount of glucose in your blood. Carb counting is a method for keeping track of how many carbs you eat. Counting carbs is important to keep your blood glucose at a healthy level, especially if you use insulin or take certain oral diabetes medicines. It is important to know how many carbs you can safely have in each meal. This is different for every person. Your dietitian can help you calculate how many carbs you should have at each meal and for each snack. Foods that contain carbs include:  Bread, cereal, rice, pasta, and crackers.  Potatoes and corn.  Peas, beans, and  lentils.  Milk and yogurt.  Fruit and juice.  Desserts, such as cakes, cookies, ice cream, and candy. How does alcohol affect me? Alcohol can cause a sudden decrease in blood glucose (hypoglycemia), especially if you use insulin or take certain oral diabetes medicines. Hypoglycemia can be a life-threatening condition. Symptoms of hypoglycemia (sleepiness, dizziness, and confusion) are similar to symptoms of having too much alcohol. If your health care provider says that alcohol is safe for you, follow these guidelines:  Limit alcohol intake to no more than 1 drink per day for nonpregnant women and 2 drinks per day for men. One drink equals 12 oz of beer, 5 oz of wine, or 1 oz of hard liquor.  Do not drink on an empty stomach.  Keep yourself hydrated with water, diet soda, or unsweetened iced tea.  Keep in mind that regular soda, juice, and other mixers may contain a lot of sugar and must be counted as carbs. What are tips for following this plan?  Reading food labels  Start by checking the serving size on the "Nutrition Facts" label of packaged foods and drinks. The amount of calories, carbs, fats, and other nutrients listed on the label is based on one serving of the item. Many items contain more than one serving per package.  Check the total grams (g) of carbs in one serving. You can calculate the number of servings of carbs in one serving by dividing the total carbs by 15. For example, if a food has 30 g of total carbs, it would be  equal to 2 servings of carbs.  Check the number of grams (g) of saturated and trans fats in one serving. Choose foods that have low or no amount of these fats.  Check the number of milligrams (mg) of salt (sodium) in one serving. Most people should limit total sodium intake to less than 2,300 mg per day.  Always check the nutrition information of foods labeled as "low-fat" or "nonfat". These foods may be higher in added sugar or refined carbs and should  be avoided.  Talk to your dietitian to identify your daily goals for nutrients listed on the label. Shopping  Avoid buying canned, premade, or processed foods. These foods tend to be high in fat, sodium, and added sugar.  Shop around the outside edge of the grocery store. This includes fresh fruits and vegetables, bulk grains, fresh meats, and fresh dairy. Cooking  Use low-heat cooking methods, such as baking, instead of high-heat cooking methods like deep frying.  Cook using healthy oils, such as olive, canola, or sunflower oil.  Avoid cooking with butter, cream, or high-fat meats. Meal planning  Eat meals and snacks regularly, preferably at the same times every day. Avoid going long periods of time without eating.  Eat foods high in fiber, such as fresh fruits, vegetables, beans, and whole grains. Talk to your dietitian about how many servings of carbs you can eat at each meal.  Eat 4-6 ounces (oz) of lean protein each day, such as lean meat, chicken, fish, eggs, or tofu. One oz of lean protein is equal to: ? 1 oz of meat, chicken, or fish. ? 1 egg. ?  cup of tofu.  Eat some foods each day that contain healthy fats, such as avocado, nuts, seeds, and fish. Lifestyle  Check your blood glucose regularly.  Exercise regularly as told by your health care provider. This may include: ? 150 minutes of moderate-intensity or vigorous-intensity exercise each week. This could be brisk walking, biking, or water aerobics. ? Stretching and doing strength exercises, such as yoga or weightlifting, at least 2 times a week.  Take medicines as told by your health care provider.  Do not use any products that contain nicotine or tobacco, such as cigarettes and e-cigarettes. If you need help quitting, ask your health care provider.  Work with a Social worker or diabetes educator to identify strategies to manage stress and any emotional and social challenges. Questions to ask a health care  provider  Do I need to meet with a diabetes educator?  Do I need to meet with a dietitian?  What number can I call if I have questions?  When are the best times to check my blood glucose? Where to find more information:  American Diabetes Association: diabetes.org  Academy of Nutrition and Dietetics: www.eatright.[employer] of Diabetes and Digestive and Kidney Diseases (NIH): [employer] Summary  A healthy meal plan will help you control your blood glucose and maintain a healthy lifestyle.  Working with a diet and nutrition specialist (dietitian) can help you make a meal plan that is best for you.  Keep in mind that carbohydrates (carbs) and alcohol have immediate effects on your blood glucose levels. It is important to count carbs and to use alcohol carefully. This information is not intended to replace advice given to you by your health care provider. Make sure you discuss any questions you have with your health care provider. Document Revised: 05/21/2017 Document Reviewed: 07/13/2016 Elsevier Patient Education  2020 [employer].

## 2019-09-27 LAB — CBC WITH DIFFERENTIAL/PLATELET
Basophils Absolute: 0.1 10*3/uL (ref 0.0–0.2)
Basos: 1 %
EOS (ABSOLUTE): 0.1 10*3/uL (ref 0.0–0.4)
Eos: 1 %
Hematocrit: 37.3 % (ref 34.0–46.6)
Hemoglobin: 11.7 g/dL (ref 11.1–15.9)
Immature Grans (Abs): 0 10*3/uL (ref 0.0–0.1)
Immature Granulocytes: 0 %
Lymphocytes Absolute: 1.6 10*3/uL (ref 0.7–3.1)
Lymphs: 21 %
MCH: 24.5 pg — ABNORMAL LOW (ref 26.6–33.0)
MCHC: 31.4 g/dL — ABNORMAL LOW (ref 31.5–35.7)
MCV: 78 fL — ABNORMAL LOW (ref 79–97)
Monocytes Absolute: 0.6 10*3/uL (ref 0.1–0.9)
Monocytes: 7 %
Neutrophils Absolute: 5.4 10*3/uL (ref 1.4–7.0)
Neutrophils: 70 %
Platelets: 423 10*3/uL (ref 150–450)
RBC: 4.78 x10E6/uL (ref 3.77–5.28)
RDW: 13.2 % (ref 11.7–15.4)
WBC: 7.8 10*3/uL (ref 3.4–10.8)

## 2019-09-27 LAB — COMPREHENSIVE METABOLIC PANEL
ALT: 10 IU/L (ref 0–32)
AST: 13 IU/L (ref 0–40)
Albumin/Globulin Ratio: 1.6 (ref 1.2–2.2)
Albumin: 4.2 g/dL (ref 3.8–4.9)
Alkaline Phosphatase: 112 IU/L (ref 39–117)
BUN/Creatinine Ratio: 22 (ref 9–23)
BUN: 13 mg/dL (ref 6–24)
Bilirubin Total: 0.3 mg/dL (ref 0.0–1.2)
CO2: 26 mmol/L (ref 20–29)
Calcium: 9.4 mg/dL (ref 8.7–10.2)
Chloride: 99 mmol/L (ref 96–106)
Creatinine, Ser: 0.58 mg/dL (ref 0.57–1.00)
GFR calc Af Amer: 117 mL/min/{1.73_m2} (ref >59)
GFR calc non Af Amer: 101 mL/min/{1.73_m2} (ref >59)
Globulin, Total: 2.6 g/dL (ref 1.5–4.5)
Glucose: 126 mg/dL — ABNORMAL HIGH (ref 65–99)
Potassium: 4.6 mmol/L (ref 3.5–5.2)
Sodium: 141 mmol/L (ref 134–144)
Total Protein: 6.8 g/dL (ref 6.0–8.5)

## 2019-09-27 LAB — HEMOGLOBIN A1C
Est. average glucose Bld gHb Est-mCnc: 169 mg/dL
Hgb A1c MFr Bld: 7.5 % — ABNORMAL HIGH (ref 4.8–5.6)

## 2019-09-27 LAB — LIPID PANEL
Chol/HDL Ratio: 2.9 ratio (ref 0.0–4.4)
Cholesterol, Total: 150 mg/dL (ref 100–199)
HDL: 52 mg/dL (ref >39)
LDL Chol Calc (NIH): 74 mg/dL (ref 0–99)
Triglycerides: 137 mg/dL (ref 0–149)
VLDL Cholesterol Cal: 24 mg/dL (ref 5–40)

## 2019-09-27 LAB — CARDIOVASCULAR RISK ASSESSMENT

## 2019-09-27 LAB — TSH: TSH: 1.33 u[IU]/mL (ref 0.450–4.500)

## 2019-09-28 ENCOUNTER — Other Ambulatory Visit: Payer: Self-pay | Admitting: Legal Medicine

## 2019-11-02 ENCOUNTER — Other Ambulatory Visit: Payer: Self-pay

## 2019-11-02 MED ORDER — ROSUVASTATIN CALCIUM 10 MG PO TABS: 10.0000 mg | ORAL_TABLET | Freq: Every day | ORAL | 0 refills | Status: DC

## 2019-11-07 ENCOUNTER — Other Ambulatory Visit: Payer: Self-pay

## 2019-11-07 MED ORDER — ROSUVASTATIN CALCIUM 10 MG PO TABS: 10.0000 mg | ORAL_TABLET | Freq: Every day | ORAL | 0 refills | Status: DC

## 2019-11-22 ENCOUNTER — Other Ambulatory Visit: Payer: Self-pay

## 2019-11-22 MED ORDER — LISINOPRIL-HYDROCHLOROTHIAZIDE 10-12.5 MG PO TABS: 1.0000 | ORAL_TABLET | Freq: Every day | ORAL | 1 refills | Status: DC

## 2019-11-22 MED ORDER — MONTELUKAST SODIUM 10 MG PO TABS
ORAL_TABLET | ORAL | 2 refills | Status: DC
Start: 2019-11-22 — End: 2020-07-24

## 2019-12-05 ENCOUNTER — Other Ambulatory Visit: Payer: Self-pay

## 2019-12-05 MED ORDER — ETODOLAC 400 MG PO TABS: 400.0000 mg | ORAL_TABLET | Freq: Two times a day (BID) | ORAL | 0 refills | Status: DC

## 2019-12-27 ENCOUNTER — Ambulatory Visit: Payer: BC Managed Care – PPO | Admitting: Family Medicine

## 2020-01-03 ENCOUNTER — Ambulatory Visit: Payer: BC Managed Care – PPO | Admitting: Family Medicine

## 2020-01-03 ENCOUNTER — Other Ambulatory Visit: Payer: Self-pay

## 2020-01-03 VITALS — BP 130/64 | HR 78 | Temp 96.9°F | Resp 18 | Ht 66.0 in | Wt 264.0 lb

## 2020-01-03 DIAGNOSIS — N644 Mastodynia: Secondary | ICD-10-CM | POA: Insufficient documentation

## 2020-01-03 DIAGNOSIS — E782 Mixed hyperlipidemia: Secondary | ICD-10-CM

## 2020-01-03 DIAGNOSIS — E119 Type 2 diabetes mellitus without complications: Secondary | ICD-10-CM

## 2020-01-03 DIAGNOSIS — I10 Essential (primary) hypertension: Secondary | ICD-10-CM

## 2020-01-03 DIAGNOSIS — E039 Hypothyroidism, unspecified: Secondary | ICD-10-CM

## 2020-01-03 LAB — HEMOGLOBIN A1C
Est. average glucose Bld gHb Est-mCnc: 171 mg/dL
Hgb A1c MFr Bld: 7.6 % — ABNORMAL HIGH (ref 4.8–5.6)

## 2020-01-03 NOTE — Patient Instructions (Addendum)
Breast ultrasound with diagnostic mammogram if indicated Foot care to include puma stone and antifungal spray. Watch type of shoes for compression bunions A1c today

## 2020-01-03 NOTE — Progress Notes (Signed)
Established Patient Office Visit  Subjective:  Patient ID: Kathleen Russo, female    DOB: 1961-05-21  Age: 59 y.o. MRN: [id number]  CC:  Chief Complaint  Patient presents with  . Diabetes  . Hyperlipidemia  . Hypothyroidism    HPI Kathleen Russo Clever presents for DM-fasting glucose 120-150-A1c 7.5%-Victoza, glucophage Hypothyroid -TSH 1.330-synthroid  Pt with right sided breast pain, no mass, no redness, no nipple d/c. No recent change in bras.  Pt denies increase use of caffeine. No h/o cysts. No breast CA personal or family Hyperlipidemia-stable on Crestor-TC 150, LDL 74 HTN-zestoretic-stable on bp checks at home Pt recently started [facility] rheumatology  Past Medical History:  Diagnosis Date  . Anemia   . Arthritis   . Cancer (HCC)    UTERINE  . Diabetes mellitus    DM2, not requiring meds as of 04/2011  . Dizziness   . GERD (gastroesophageal reflux disease)   . Hemorrhoids   . Hyperlipidemia   . Hypertension    not requiring meds as of 04/2011  . Hypothyroidism   . Lichen sclerosus   . PONV (postoperative nausea and vomiting)   . Primary osteoarthritis   . Sleep apnea    USES CPAP  . Swelling of both ankles   . Type 2 diabetes mellitus (HCC)     Past Surgical History:  Procedure Laterality Date  . ABDOMINAL HYSTERECTOMY    . BARIATRIC SURGERY    . BREAST LUMPECTOMY Left   . BREAST SURGERY  1979  . ELBOW SURGERY  1990'S  . GASTRIC BYPASS  2007  . JOINT REPLACEMENT  2002   RT. KNEE  . KNEE ARTHROSCOPY  1993   LT. KNEE  . KNEE ARTHROSCOPY W/ LATERAL RELEASE  1987   RT. KNEE  . left hip replacement  05/2017  . TONSILLECTOMY    . TOTAL KNEE ARTHROPLASTY  07/06/2011   Procedure: TOTAL KNEE ARTHROPLASTY;  Surgeon: Raymon Mutton, MD;  Location: [facility] OR;  Service: Orthopedics;  Laterality: Left;    Family History  Problem Relation Age of Onset  . Hypertension Mother   . Thyroid disease Mother   . Diabetes Mother   . Other  Father        Wegoners disease  . Thyroid disease Sister   . Graves' disease Brother     Social History   Socioeconomic History  . Marital status: Married    Spouse name: Not on file  . Number of children: Not on file  . Years of education: Not on file  . Highest education level: Not on file  Occupational History  . Occupation: retired  Tobacco Use  . Smoking status: Never Smoker  . Smokeless tobacco: Never Used  Vaping Use  . Vaping Use: Never used  Substance and Sexual Activity  . Alcohol use: No  . Drug use: No  . Sexual activity: Yes    Partners: Male    Birth control/protection: Surgical    Comment: hysterectomy  Other Topics Concern  . Not on file  Social History Narrative  . Not on file   Social Determinants of Health   Financial Resource Strain:   . Difficulty of Paying Living Expenses:   Food Insecurity:   . Worried About Programme researcher, broadcasting/film/video in the Last Year:   . Barista in the Last Year:   Transportation Needs:   . Freight forwarder (Medical):   Marland Kitchen Lack of Transportation (Non-Medical):   Physical Activity:   .  Days of Exercise per Week:   . Minutes of Exercise per Session:   Stress:   . Feeling of Stress :   Social Connections:   . Frequency of Communication with Friends and Family:   . Frequency of Social Gatherings with Friends and Family:   . Attends Religious Services:   . Active Member of Clubs or Organizations:   . Attends Banker Meetings:   Marland Kitchen Marital Status:   Intimate Partner Violence:   . Fear of Current or Ex-Partner:   . Emotionally Abused:   Marland Kitchen Physically Abused:   . Sexually Abused:     Outpatient Medications Prior to Visit  Medication Sig Dispense Refill  . cyclobenzaprine (FLEXERIL) 5 MG tablet Take 5 mg by mouth 3 (three) times daily as needed.    . etodolac (LODINE) 400 MG tablet Take 1 tablet (400 mg total) by mouth 2 (two) times daily. 180 tablet 0  . levothyroxine (SYNTHROID) 137 MCG tablet Take  137 mcg by mouth daily.    Marland Kitchen lisinopril-hydrochlorothiazide (ZESTORETIC) 10-12.5 MG tablet Take 1 tablet by mouth daily. 90 tablet 1  . metFORMIN (GLUCOPHAGE) 1000 MG tablet TAKE ONE TABLET TWICE DAILY WITH MORNING AND EVENING MEAL 180 tablet 1  . montelukast (SINGULAIR) 10 MG tablet TAKE ONE (1) TABLET BY MOUTH ONCE DAILY 90 tablet 2  . polyethylene glycol (MIRALAX / GLYCOLAX) 17 g packet Take 17 g by mouth daily.    . rosuvastatin (CRESTOR) 10 MG tablet Take 1 tablet (10 mg total) by mouth daily. 90 tablet 0  . VICTOZA 18 MG/3ML SOPN     . blood glucose meter kit and supplies KIT Dispense based on patient and insurance preference. Check daily in am. E11.69. 1 each 0   No facility-administered medications prior to visit.    Allergies  Allergen Reactions  . Nsaids     Medications don't absorb  . Quinolones     Medications don't absorb  . Tolmetin Other (See Comments)    THIS MEDICATION WILL NOT ABSORB D/T GBP Medications don't absorb     ROS Review of Systems  Constitutional: Negative for chills, fatigue and fever.  HENT: Positive for sore throat. Negative for congestion and ear pain.   Respiratory: Negative for cough and shortness of breath.   Cardiovascular: Negative for chest pain and palpitations.  Gastrointestinal: Negative for abdominal pain, constipation and diarrhea.  Genitourinary: Positive for frequency. Negative for dysuria.  Musculoskeletal: Positive for arthralgias (RA treated with Methotrexate).       Chest wall tenderness to palpation  Skin: Negative.   Neurological: Negative for dizziness and headaches.  Psychiatric/Behavioral: Negative for dysphoric mood. The patient is not nervous/anxious.       Objective:     Today's Vitals   01/03/20 0729  BP: 130/64  Pulse: 78  Resp: 18  Temp: (!) 96.9 F (36.1 C)  Weight: 264 lb (119.7 kg)  Height: 5\' 6"  (1.676 m)   Body mass index is 42.61 kg/m. Physical Exam Constitutional:      Appearance: She is obese.   HENT:     Head: Normocephalic and atraumatic.  Cardiovascular:     Rate and Rhythm: Normal rate and regular rhythm.     Pulses: Normal pulses.     Heart sounds: Normal heart sounds.  Pulmonary:     Effort: Pulmonary effort is normal.     Breath sounds: Normal breath sounds.  Chest:     Chest wall: Tenderness present.  Musculoskeletal:  Cervical back: Neck supple.     Right foot: Bunion present.     Left foot: Bunion present.       Feet:  Feet:     Left foot:     Skin integrity: Callus present.  Skin:    General: Skin is dry.  Neurological:     Mental Status: She is oriented to person, place, and time.  Psychiatric:        Mood and Affect: Mood normal.        Behavior: Behavior normal.     BP 130/64   Pulse 78   Temp (!) 96.9 F (36.1 C)   Resp 18   Ht 5\' 6"  (1.676 m)   Wt 264 lb (119.7 kg)   BMI 42.61 kg/m  Wt Readings from Last 3 Encounters:  01/03/20 264 lb (119.7 kg)  09/26/19 259 lb (117.5 kg)  05/02/19 259 lb (117.5 kg)     Health Maintenance Due  Topic Date Due  . Hepatitis C Screening  Never done  . PNEUMOCOCCAL POLYSACCHARIDE VACCINE AGE 42-64 HIGH RISK  Never done  . FOOT EXAM  Never done  . OPHTHALMOLOGY EXAM  Never done  . HIV Screening  Never done  . COLONOSCOPY  02/02/2010  . COVID-19 Vaccine (2 - Pfizer 2-dose series) 10/02/2019    Lab Results  Component Value Date   TSH 1.330 09/26/2019   Lab Results  Component Value Date   WBC 7.8 09/26/2019   HGB 11.7 09/26/2019   HCT 37.3 09/26/2019   MCV 78 (L) 09/26/2019   PLT 423 09/26/2019   Lab Results  Component Value Date   NA 141 09/26/2019   K 4.6 09/26/2019   CO2 26 09/26/2019   GLUCOSE 126 (H) 09/26/2019   BUN 13 09/26/2019   CREATININE 0.58 09/26/2019   BILITOT 0.3 09/26/2019   ALKPHOS 112 09/26/2019   AST 13 09/26/2019   ALT 10 09/26/2019   PROT 6.8 09/26/2019   ALBUMIN 4.2 09/26/2019   CALCIUM 9.4 09/26/2019   Lab Results  Component Value Date   CHOL 150  09/26/2019   Lab Results  Component Value Date   HDL 52 09/26/2019   Lab Results  Component Value Date   LDLCALC 74 09/26/2019   Lab Results  Component Value Date   TRIG 137 09/26/2019   Lab Results  Component Value Date   CHOLHDL 2.9 09/26/2019   Lab Results  Component Value Date   HGBA1C 7.5 (H) 09/26/2019      Assessment & Plan:  1. Diabetes mellitus without complication (HCC) - Hemoglobin A1c Stable -metformin-foot exam-bunions and callous Antifungal spray suggested, re-eval shoes to avoid compression of bunions 2. Breast pain in female No discrete masses noted-tenderness to palpation-lateral outer quad right and inner lower on the left-consider fibroglandular tissue inflammation-consider measure for new undergarments  - MM DIAG BREAST TOMO BILATERAL; Future - US BREAST LTD UNI LEFT INC AXILLA; Future - US BREAST LTD UNI RIGHT INC AXILLA; Future  3. Essential hypertension Stable-HCTZ/lisinopril-renal function normal  4. Mixed hyperlipidemia Stable Rosuvastatin daily-4/21 lipid panel with nl LFT  5. Hypothyroidism, unspecified type Normal TSH 4/21   Wandalee Ferdinand, RN

## 2020-01-11 ENCOUNTER — Ambulatory Visit: Payer: BC Managed Care – PPO | Admitting: Family Medicine

## 2020-01-16 ENCOUNTER — Other Ambulatory Visit: Payer: Self-pay | Admitting: Legal Medicine

## 2020-01-19 ENCOUNTER — Ambulatory Visit: Payer: BC Managed Care – PPO

## 2020-01-19 ENCOUNTER — Ambulatory Visit
Admission: RE | Admit: 2020-01-19 | Discharge: 2020-01-19 | Disposition: A | Discharge status: Home / Self Care | Payer: BC Managed Care – PPO | Source: Ambulatory Visit | Attending: Family Medicine | Admitting: Family Medicine

## 2020-01-19 ENCOUNTER — Other Ambulatory Visit: Payer: Self-pay

## 2020-01-19 DIAGNOSIS — N644 Mastodynia: Secondary | ICD-10-CM

## 2020-01-30 ENCOUNTER — Other Ambulatory Visit: Payer: Self-pay

## 2020-02-09 ENCOUNTER — Other Ambulatory Visit: Payer: Self-pay | Admitting: Family Medicine

## 2020-03-05 ENCOUNTER — Other Ambulatory Visit: Payer: Self-pay

## 2020-03-06 ENCOUNTER — Other Ambulatory Visit: Payer: Self-pay | Admitting: Family Medicine

## 2020-03-06 ENCOUNTER — Other Ambulatory Visit: Payer: Self-pay | Admitting: Legal Medicine

## 2020-03-06 MED ORDER — VICTOZA 18 MG/3ML ~~LOC~~ SOPN: 1.8000 mg | PEN_INJECTOR | SUBCUTANEOUS | 2 refills | Status: DC

## 2020-04-17 ENCOUNTER — Ambulatory Visit: Payer: BC Managed Care – PPO | Admitting: Family Medicine

## 2020-04-18 ENCOUNTER — Other Ambulatory Visit: Payer: BC Managed Care – PPO

## 2020-04-18 ENCOUNTER — Other Ambulatory Visit: Payer: Self-pay | Admitting: Family Medicine

## 2020-04-18 DIAGNOSIS — I10 Essential (primary) hypertension: Secondary | ICD-10-CM

## 2020-04-18 DIAGNOSIS — E782 Mixed hyperlipidemia: Secondary | ICD-10-CM

## 2020-04-18 DIAGNOSIS — R5383 Other fatigue: Secondary | ICD-10-CM

## 2020-04-18 DIAGNOSIS — E1169 Type 2 diabetes mellitus with other specified complication: Secondary | ICD-10-CM

## 2020-04-19 LAB — CBC WITH DIFFERENTIAL/PLATELET
Basophils Absolute: 0 10*3/uL (ref 0.0–0.2)
Basos: 1 %
EOS (ABSOLUTE): 0.1 10*3/uL (ref 0.0–0.4)
Eos: 2 %
Hematocrit: 35.2 % (ref 34.0–46.6)
Hemoglobin: 11.3 g/dL (ref 11.1–15.9)
Immature Grans (Abs): 0 10*3/uL (ref 0.0–0.1)
Immature Granulocytes: 1 %
Lymphocytes Absolute: 1.5 10*3/uL (ref 0.7–3.1)
Lymphs: 20 %
MCH: 26.2 pg — ABNORMAL LOW (ref 26.6–33.0)
MCHC: 32.1 g/dL (ref 31.5–35.7)
MCV: 82 fL (ref 79–97)
Monocytes Absolute: 0.5 10*3/uL (ref 0.1–0.9)
Monocytes: 7 %
Neutrophils Absolute: 5 10*3/uL (ref 1.4–7.0)
Neutrophils: 69 %
Platelets: 353 10*3/uL (ref 150–450)
RBC: 4.31 x10E6/uL (ref 3.77–5.28)
RDW: 15 % (ref 11.7–15.4)
WBC: 7.2 10*3/uL (ref 3.4–10.8)

## 2020-04-19 LAB — COMPREHENSIVE METABOLIC PANEL
ALT: 10 IU/L (ref 0–32)
AST: 12 IU/L (ref 0–40)
Albumin/Globulin Ratio: 1.7 (ref 1.2–2.2)
Albumin: 4 g/dL (ref 3.8–4.9)
Alkaline Phosphatase: 106 IU/L (ref 44–121)
BUN/Creatinine Ratio: 26 (ref 12–28)
BUN: 14 mg/dL (ref 8–27)
Bilirubin Total: 0.2 mg/dL (ref 0.0–1.2)
CO2: 27 mmol/L (ref 20–29)
Calcium: 8.9 mg/dL (ref 8.7–10.3)
Chloride: 101 mmol/L (ref 96–106)
Creatinine, Ser: 0.53 mg/dL — ABNORMAL LOW (ref 0.57–1.00)
GFR calc Af Amer: 119 mL/min/{1.73_m2} (ref >59)
GFR calc non Af Amer: 104 mL/min/{1.73_m2} (ref >59)
Globulin, Total: 2.3 g/dL (ref 1.5–4.5)
Glucose: 133 mg/dL — ABNORMAL HIGH (ref 65–99)
Potassium: 4.3 mmol/L (ref 3.5–5.2)
Sodium: 141 mmol/L (ref 134–144)
Total Protein: 6.3 g/dL (ref 6.0–8.5)

## 2020-04-19 LAB — LIPID PANEL
Chol/HDL Ratio: 2.7 ratio (ref 0.0–4.4)
Cholesterol, Total: 126 mg/dL (ref 100–199)
HDL: 46 mg/dL (ref >39)
LDL Chol Calc (NIH): 56 mg/dL (ref 0–99)
Triglycerides: 137 mg/dL (ref 0–149)
VLDL Cholesterol Cal: 24 mg/dL (ref 5–40)

## 2020-04-19 LAB — HEMOGLOBIN A1C
Est. average glucose Bld gHb Est-mCnc: 183 mg/dL
Hgb A1c MFr Bld: 8 % — ABNORMAL HIGH (ref 4.8–5.6)

## 2020-04-19 LAB — TSH: TSH: 1.53 u[IU]/mL (ref 0.450–4.500)

## 2020-04-19 LAB — CARDIOVASCULAR RISK ASSESSMENT

## 2020-04-19 LAB — SEDIMENTATION RATE: Sed Rate: 23 mm/hr (ref 0–40)

## 2020-04-22 ENCOUNTER — Encounter: Payer: Self-pay | Admitting: Family Medicine

## 2020-04-22 NOTE — Progress Notes (Signed)
Established Patient Office Visit  Subjective:  Patient ID: Kathleen Russo, female    DOB: 02/16/1961  Age: 59 y.o. MRN: [id number]  CC:  Chief Complaint  Patient presents with  . Diabetes  . Hypertension  . Hyperlipidemia    HPI Patient presents with type 2 diabetes mellitus, non-insulin requiring diabetes, complicated by obesity, hyperlipidemia, and hypertension.   Diabetes complicated by hyperlipidemia: Date of diagnosis 64.  Sugars 120-150.  Eating healthier. Exhausted all the time. She follows a diabetic diet.  She specifically denies associated symptoms, including fatigue, excessive thirst, excessively hunger and frequent urination.  Current meds include an oral hypoglycemic Glucophage (1000 mg bid ), insulin/injectable (victoza), and Prinivil.  She checks her glucose 3 times per week.  In regard to preventative care, she performs foot self-exams daily and her last ophthalmology exam was in 2021.      Pt presents with hyperlipidemia.  Current treatment includes Crestor and a low cholesterol/low fat diet.  She is maintaining her exercise regimen.  She denies experiencing any hypercholesterolemia related symptoms.      Pt presents for follow up of hypertension.  Her current cardiac medication regimen includes lisinopril/hctz 10/12.5 daily.   She is tolerating the medication well without side effects.  Compliance with treatment has been fair; she eats fairly healthy, but does not exercise.  Hypothyroidism controlled on levothyroxine 137 mcg once daily in am.   Past Medical History:  Diagnosis Date  . Anemia   . Arthritis   . Cancer (HCC)    UTERINE  . Diabetes mellitus    DM2, not requiring meds as of 04/2011  . Dizziness   . GERD (gastroesophageal reflux disease)   . Hemorrhoids   . Hyperlipidemia   . Hypertension    not requiring meds as of 04/2011  . Hypothyroidism   . Lichen sclerosus   . PONV (postoperative nausea and vomiting)   . Primary osteoarthritis   .  Sleep apnea    USES CPAP  . Swelling of both ankles   . Type 2 diabetes mellitus (HCC)     Past Surgical History:  Procedure Laterality Date  . ABDOMINAL HYSTERECTOMY    . BARIATRIC SURGERY    . BREAST LUMPECTOMY Left   . BREAST SURGERY  1979  . ELBOW SURGERY  1990'S  . GASTRIC BYPASS  2007  . JOINT REPLACEMENT  2002   RT. KNEE  . KNEE ARTHROSCOPY  1993   LT. KNEE  . KNEE ARTHROSCOPY W/ LATERAL RELEASE  1987   RT. KNEE  . left hip replacement  05/2017  . TONSILLECTOMY    . TOTAL KNEE ARTHROPLASTY  07/06/2011   Procedure: TOTAL KNEE ARTHROPLASTY;  Surgeon: Raymon Mutton, MD;  Location: [facility] OR;  Service: Orthopedics;  Laterality: Left;    Family History  Problem Relation Age of Onset  . Hypertension Mother   . Thyroid disease Mother   . Diabetes Mother   . Other Father        Wegoners disease  . Thyroid disease Sister   . Graves' disease Brother     Social History   Socioeconomic History  . Marital status: Married    Spouse name: Not on file  . Number of children: Not on file  . Years of education: Not on file  . Highest education level: Not on file  Occupational History  . Occupation: retired  Tobacco Use  . Smoking status: Never Smoker  . Smokeless tobacco: Never Used  Vaping Use  .  Vaping Use: Never used  Substance and Sexual Activity  . Alcohol use: No  . Drug use: No  . Sexual activity: Yes    Partners: Male    Birth control/protection: Surgical    Comment: hysterectomy  Other Topics Concern  . Not on file  Social History Narrative  . Not on file   Social Determinants of Health   Financial Resource Strain:   . Difficulty of Paying Living Expenses: Not on file  Food Insecurity:   . Worried About Programme researcher, broadcasting/film/video in the Last Year: Not on file  . Ran Out of Food in the Last Year: Not on file  Transportation Needs:   . Lack of Transportation (Medical): Not on file  . Lack of Transportation (Non-Medical): Not on file  Physical Activity:   .  Days of Exercise per Week: Not on file  . Minutes of Exercise per Session: Not on file  Stress:   . Feeling of Stress : Not on file  Social Connections:   . Frequency of Communication with Friends and Family: Not on file  . Frequency of Social Gatherings with Friends and Family: Not on file  . Attends Religious Services: Not on file  . Active Member of Clubs or Organizations: Not on file  . Attends Banker Meetings: Not on file  . Marital Status: Not on file  Intimate Partner Violence:   . Fear of Current or Ex-Partner: Not on file  . Emotionally Abused: Not on file  . Physically Abused: Not on file  . Sexually Abused: Not on file    Outpatient Medications Prior to Visit  Medication Sig Dispense Refill  . folic acid (FOLVITE) 1 MG tablet Take 1 mg by mouth 2 (two) times daily.    . blood glucose meter kit and supplies KIT Dispense based on patient and insurance preference. Check daily in am. E11.69. 1 each 0  . cyclobenzaprine (FLEXERIL) 5 MG tablet Take 5 mg by mouth 3 (three) times daily as needed.    . etodolac (LODINE) 400 MG tablet Take 1 tablet (400 mg total) by mouth 2 (two) times daily. 180 tablet 0  . levothyroxine (SYNTHROID) 137 MCG tablet TAKE ONE (1) TABLET BY MOUTH ONCE DAILY 90 tablet 2  . lisinopril-hydrochlorothiazide (ZESTORETIC) 10-12.5 MG tablet Take 1 tablet by mouth daily. 90 tablet 1  . montelukast (SINGULAIR) 10 MG tablet TAKE ONE (1) TABLET BY MOUTH ONCE DAILY 90 tablet 2  . ONETOUCH ULTRA test strip USE TO CHECK BLOOD SUGAR ONCE DAILY IN THE MORNING 50 each 1  . polyethylene glycol (MIRALAX / GLYCOLAX) 17 g packet Take 17 g by mouth daily.    . rosuvastatin (CRESTOR) 10 MG tablet TAKE 1 TABLET DAILY 90 tablet 0  . metFORMIN (GLUCOPHAGE) 1000 MG tablet TAKE ONE TABLET TWICE DAILY WITH MORNING AND EVENING MEAL 180 tablet 1  . methotrexate (50 MG/ML) 1 g injection Inject into the vein once.    Marland Kitchen VICTOZA 18 MG/3ML SOPN Inject 1.8 mg into the skin  once a week. 15 mL 2  . VICTOZA 18 MG/3ML SOPN INJECT 1.8 MG UNDER THE SKIN DAILY 6 mL 6   No facility-administered medications prior to visit.    Allergies  Allergen Reactions  . Nsaids     Medications don't absorb  . Quinolones     Medications don't absorb  . Tolmetin Other (See Comments)    THIS MEDICATION WILL NOT ABSORB D/T GBP Medications don't absorb  ROS Review of Systems  Constitutional: Positive for fatigue and unexpected weight change (weight gain ). Negative for chills and fever.  HENT: Positive for rhinorrhea. Negative for congestion, ear pain and sore throat.   Respiratory: Negative for cough and shortness of breath.   Cardiovascular: Negative for chest pain.  Gastrointestinal: Negative for abdominal pain, constipation, diarrhea, nausea and vomiting.  Endocrine: Negative for polydipsia, polyphagia and polyuria.  Genitourinary: Negative for dysuria and urgency.  Musculoskeletal: Positive for arthralgias, back pain and joint swelling. Negative for myalgias.       In hands. [facility] rheumat[facility]. Her diagnosis is unclear. Pt needs to have an appointment.  Neurological: Positive for dizziness and headaches. Negative for weakness and light-headedness.       Occasional. Vertigo with laying down occasionally.   Psychiatric/Behavioral: Negative for dysphoric mood. The patient is not nervous/anxious.       Objective:    Physical Exam Constitutional:      Appearance: She is well-developed.  Cardiovascular:     Rate and Rhythm: Normal rate and regular rhythm.     Heart sounds: Normal heart sounds.  Pulmonary:     Effort: Pulmonary effort is normal.     Breath sounds: Normal breath sounds.  Abdominal:     General: Bowel sounds are normal.     Palpations: Abdomen is soft.     Tenderness: There is no abdominal tenderness.  Neurological:     Mental Status: She is alert.  Psychiatric:        Behavior: Behavior normal.    Diabetic Foot Exam - Simple    Simple Foot Form Diabetic Foot exam was performed with the following findings: Yes 04/22/2020  9:00 AM  Visual Inspection No deformities, no ulcerations, no other skin breakdown bilaterally: Yes Sensation Testing Intact to touch and monofilament testing bilaterally: Yes Pulse Check Posterior Tibialis and Dorsalis pulse intact bilaterally: Yes Comments    BP 118/62   Pulse 84   Temp (!) 96.5 F (35.8 C)   Ht 5\' 6"  (1.676 m)   Wt 269 lb (122 kg)   BMI 43.42 kg/m  Wt Readings from Last 3 Encounters:  04/23/20 269 lb (122 kg)  01/03/20 264 lb (119.7 kg)  09/26/19 259 lb (117.5 kg)     Health Maintenance Due  Topic Date Due  . Hepatitis C Screening  Never done  . PNEUMOCOCCAL POLYSACCHARIDE VACCINE AGE 50-64 HIGH RISK  Never done  . OPHTHALMOLOGY EXAM  Never done  . HIV Screening  Never done  . COLONOSCOPY  02/02/2010    There are no preventive care reminders to display for this patient.  Lab Results  Component Value Date   TSH 1.530 04/18/2020   Lab Results  Component Value Date   WBC 7.2 04/18/2020   HGB 11.3 04/18/2020   HCT 35.2 04/18/2020   MCV 82 04/18/2020   PLT 353 04/18/2020   Lab Results  Component Value Date   NA 141 04/18/2020   K 4.3 04/18/2020   CO2 27 04/18/2020   GLUCOSE 133 (H) 04/18/2020   BUN 14 04/18/2020   CREATININE 0.53 (L) 04/18/2020   BILITOT 0.2 04/18/2020   ALKPHOS 106 04/18/2020   AST 12 04/18/2020   ALT 10 04/18/2020   PROT 6.3 04/18/2020   ALBUMIN 4.0 04/18/2020   CALCIUM 8.9 04/18/2020   Lab Results  Component Value Date   CHOL 126 04/18/2020   Lab Results  Component Value Date   HDL 46 04/18/2020   Lab  Results  Component Value Date   LDLCALC 56 04/18/2020   Lab Results  Component Value Date   TRIG 137 04/18/2020   Lab Results  Component Value Date   CHOLHDL 2.7 04/18/2020   Lab Results  Component Value Date   HGBA1C 8.0 (H) 04/18/2020      Assessment & Plan:  1. Essential hypertension Well  controlled.  No changes to medicines.  Continue to work on eating a healthy diet and exercise.   2. Acquired Hypothyroidism The current medical regimen is effective;  continue present plan and medications.  3. Combined hyperlipidemia associated with type 2 diabetes mellitus (HCC) Control: not at goal Recommend check sugars fasting daily. Recommend check feet daily. Recommend annual eye exams. Medicines: Stop metformin. Start janumet xr 50/1000 mg 2 in am.  Continue victoza 1.8 daily.  Continue to work on eating a healthy diet and exercise.   4. Mixed hyperlipidemia Well controlled.  No changes to medicines.  Continue to work on eating a healthy diet and exercise.   5. OSA (obstructive sleep apnea) - Home sleep test  6. Mild vitamin D deficiency - VITAMIN D 25 Hydroxy (Vit-D Deficiency, Fractures)  7. Paresthesia - Methylmalonic acid, serum - B12 and Folate Panel  8. Encounter for immunization - Flu Vaccine MDCK QUAD PF  Follow-up: Return in about 3 months (around 07/24/2020) for fasting.    Blane Ohara, MD

## 2020-04-23 ENCOUNTER — Other Ambulatory Visit: Payer: Self-pay

## 2020-04-23 ENCOUNTER — Encounter: Payer: Self-pay | Admitting: Family Medicine

## 2020-04-23 ENCOUNTER — Ambulatory Visit: Payer: BC Managed Care – PPO | Admitting: Family Medicine

## 2020-04-23 VITALS — BP 118/62 | HR 84 | Temp 96.5°F | Ht 66.0 in | Wt 269.0 lb

## 2020-04-23 DIAGNOSIS — G4733 Obstructive sleep apnea (adult) (pediatric): Secondary | ICD-10-CM | POA: Insufficient documentation

## 2020-04-23 DIAGNOSIS — E782 Mixed hyperlipidemia: Secondary | ICD-10-CM

## 2020-04-23 DIAGNOSIS — E039 Hypothyroidism, unspecified: Secondary | ICD-10-CM | POA: Diagnosis not present

## 2020-04-23 DIAGNOSIS — Z23 Encounter for immunization: Secondary | ICD-10-CM

## 2020-04-23 DIAGNOSIS — E1165 Type 2 diabetes mellitus with hyperglycemia: Secondary | ICD-10-CM | POA: Diagnosis not present

## 2020-04-23 DIAGNOSIS — E119 Type 2 diabetes mellitus without complications: Secondary | ICD-10-CM

## 2020-04-23 DIAGNOSIS — I10 Essential (primary) hypertension: Secondary | ICD-10-CM

## 2020-04-23 DIAGNOSIS — R202 Paresthesia of skin: Secondary | ICD-10-CM

## 2020-04-23 DIAGNOSIS — E559 Vitamin D deficiency, unspecified: Secondary | ICD-10-CM

## 2020-04-23 MED ORDER — VICTOZA 18 MG/3ML ~~LOC~~ SOPN: 1.8000 mg | PEN_INJECTOR | Freq: Every day | SUBCUTANEOUS | 0 refills | Status: DC

## 2020-04-23 MED ORDER — JANUMET XR 50-1000 MG PO TB24: 2.0000 | ORAL_TABLET | Freq: Every day | ORAL | 0 refills | Status: DC

## 2020-04-23 NOTE — Patient Instructions (Signed)
Stop metformin.  Start janumet xr 50/1000 mg 2 in am.  Continue victoza 1.8 daily.  Work on diet.  Ordering sleep study.  Adding on some vitamin levels.

## 2020-04-26 LAB — METHYLMALONIC ACID, SERUM: Methylmalonic Acid: 134 nmol/L (ref 0–378)

## 2020-04-26 LAB — VITAMIN D 25 HYDROXY (VIT D DEFICIENCY, FRACTURES): Vit D, 25-Hydroxy: 19.7 ng/mL — ABNORMAL LOW (ref 30.0–100.0)

## 2020-04-26 LAB — B12 AND FOLATE PANEL
Folate: >20 ng/mL (ref >3.0)
Vitamin B-12: 189 pg/mL — ABNORMAL LOW (ref 232–1245)

## 2020-04-26 LAB — SPECIMEN STATUS REPORT

## 2020-04-27 ENCOUNTER — Other Ambulatory Visit: Payer: Self-pay | Admitting: Family Medicine

## 2020-04-27 MED ORDER — CYANOCOBALAMIN 1000 MCG/ML IJ SOLN
1000.0000 ug | INTRAMUSCULAR | 0 refills | Status: DC
Start: 2020-04-27 — End: 2020-10-25

## 2020-04-27 MED ORDER — VITAMIN D (ERGOCALCIFEROL) 1.25 MG (50000 UNIT) PO CAPS
50000.0000 [IU] | ORAL_CAPSULE | ORAL | 0 refills | Status: DC
Start: 2020-04-29 — End: 2020-10-25

## 2020-04-28 ENCOUNTER — Encounter: Payer: Self-pay | Admitting: Family Medicine

## 2020-05-01 ENCOUNTER — Other Ambulatory Visit: Payer: Self-pay

## 2020-05-01 ENCOUNTER — Telehealth: Payer: Self-pay

## 2020-05-01 NOTE — Telephone Encounter (Signed)
Patient would like Flonase sent into  [facility], she states that her insurance covers it.

## 2020-05-02 ENCOUNTER — Other Ambulatory Visit: Payer: Self-pay

## 2020-05-02 MED ORDER — FLUTICASONE PROPIONATE 50 MCG/ACT NA SUSP
2.0000 | Freq: Every day | NASAL | 2 refills | Status: DC
Start: 2020-05-02 — End: 2021-08-12

## 2020-05-03 ENCOUNTER — Ambulatory Visit: Payer: BC Managed Care – PPO | Admitting: Certified Nurse Midwife

## 2020-07-01 ENCOUNTER — Other Ambulatory Visit: Payer: Self-pay | Admitting: Legal Medicine

## 2020-07-24 ENCOUNTER — Ambulatory Visit (INDEPENDENT_AMBULATORY_CARE_PROVIDER_SITE_OTHER): Payer: BC Managed Care – PPO | Admitting: Family Medicine

## 2020-07-24 ENCOUNTER — Encounter: Payer: Self-pay | Admitting: Family Medicine

## 2020-07-24 ENCOUNTER — Other Ambulatory Visit: Payer: Self-pay

## 2020-07-24 VITALS — BP 130/72 | HR 68 | Temp 96.4°F | Resp 18 | Ht 66.0 in | Wt 268.0 lb

## 2020-07-24 DIAGNOSIS — E782 Mixed hyperlipidemia: Secondary | ICD-10-CM | POA: Diagnosis not present

## 2020-07-24 DIAGNOSIS — E1169 Type 2 diabetes mellitus with other specified complication: Secondary | ICD-10-CM | POA: Diagnosis not present

## 2020-07-24 DIAGNOSIS — M05741 Rheumatoid arthritis with rheumatoid factor of right hand without organ or systems involvement: Secondary | ICD-10-CM

## 2020-07-24 DIAGNOSIS — G4733 Obstructive sleep apnea (adult) (pediatric): Secondary | ICD-10-CM | POA: Diagnosis not present

## 2020-07-24 DIAGNOSIS — M05742 Rheumatoid arthritis with rheumatoid factor of left hand without organ or systems involvement: Secondary | ICD-10-CM

## 2020-07-24 DIAGNOSIS — I10 Essential (primary) hypertension: Secondary | ICD-10-CM

## 2020-07-24 DIAGNOSIS — E039 Hypothyroidism, unspecified: Secondary | ICD-10-CM

## 2020-07-24 DIAGNOSIS — E559 Vitamin D deficiency, unspecified: Secondary | ICD-10-CM

## 2020-07-24 DIAGNOSIS — E871 Hypo-osmolality and hyponatremia: Secondary | ICD-10-CM

## 2020-07-24 DIAGNOSIS — H8113 Benign paroxysmal vertigo, bilateral: Secondary | ICD-10-CM

## 2020-07-24 DIAGNOSIS — E538 Deficiency of other specified B group vitamins: Secondary | ICD-10-CM

## 2020-07-24 LAB — POCT UA - MICROALBUMIN: Microalbumin Ur, POC: 30 mg/L

## 2020-07-24 NOTE — Progress Notes (Signed)
Subjective:  Patient ID: Kathleen Russo, female    DOB: 06/30/1960  Age: 60 y.o. MRN: [id number]  Chief Complaint  Patient presents with  . Hypertension  . Diabetes    HPI Hyponatremia:found last week at the rheumatologist. Na was 123. Is not drinking too much water   Hypertension: On lisinopril/hctz 10/12.5 mg once daily.   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 Sugar was 150 -200. On victoza 1.8 mg once daily and Janumet 50/1000 mg 2 daily.  Patient is working on eating healthy. Unable to exercise due to right hip pain. Takes etodolac from RA doctor sometimes.   RA: Patient is currently on methotrexate injections once weekly, folic acid 1 mg twice daily and etodolac 400 mg twice daily. Hypothyroidism: Synthroid 137 mcg once daily in am.  OSA: Sleep Study -patient states it was discussed during her last office visit about having an at home sleep study. She has used a CPAP in the past. Hyperlipidemia: Currenlty on crestor 10 mg once daily. Patient has some issues with recurrent vertigo.  She particularly gets dizzy when she turns her head while in bed.  She has not tried exercises for this.  Current Outpatient Medications on File Prior to Visit  Medication Sig Dispense Refill  . Ascorbic Acid (VITAMIN C) 1000 MG tablet Take 1,000 mg by mouth daily.    . Bioflavonoid Products (QUERCETIN COMPLEX IMMUNE PO) Take by mouth.    . zinc gluconate 50 MG tablet Take 50 mg by mouth daily.    . blood glucose meter kit and supplies KIT Dispense based on patient and insurance preference. Check daily in am. E11.69. 1 each 0  . cyanocobalamin (,VITAMIN B-12,) 1000 MCG/ML injection Inject 1 mL (1,000 mcg total) into the muscle once a week. 4 mL 0  . cyclobenzaprine (FLEXERIL) 5 MG tablet Take 5 mg by mouth 3 (three) times daily as needed.    . fluticasone (FLONASE) 50 MCG/ACT nasal spray Place 2 sprays into both nostrils daily. 16 g 2  . folic acid (FOLVITE) 1 MG tablet  Take 1 mg by mouth 2 (two) times daily.    Marland Kitchen levothyroxine (SYNTHROID) 137 MCG tablet TAKE ONE (1) TABLET BY MOUTH ONCE DAILY 90 tablet 2  . liraglutide (VICTOZA) 18 MG/3ML SOPN Inject 1.8 mg into the skin daily. 9 mL 0  . lisinopril-hydrochlorothiazide (ZESTORETIC) 10-12.5 MG tablet TAKE ONE (1) TABLET BY MOUTH ONCE DAILY 90 tablet 2  . methotrexate 250 MG/10ML injection SMARTSIG:0.7 Milliliter(s) SUB-Q Once a Week    . ONETOUCH ULTRA test strip USE TO CHECK BLOOD SUGAR ONCE DAILY IN THE MORNING 50 each 1  . polyethylene glycol (MIRALAX / GLYCOLAX) 17 g packet Take 17 g by mouth daily.    . rosuvastatin (CRESTOR) 10 MG tablet TAKE 1 TABLET DAILY 90 tablet 0  . SitaGLIPtin-MetFORMIN HCl (JANUMET XR) 50-1000 MG TB24 Take 2 tablets by mouth daily. 180 tablet 0  . Vitamin D, Ergocalciferol, (DRISDOL) 1.25 MG (50000 UNIT) CAPS capsule Take 1 capsule (50,000 Units total) by mouth 2 (two) times a week. 24 capsule 0   No current facility-administered medications on file prior to visit.   Past Medical History:  Diagnosis Date  . Anemia   . Arthritis   . Cancer (HCC)    UTERINE  . Diabetes mellitus    DM2, not requiring meds as of 04/2011  . Dizziness   . GERD (gastroesophageal reflux disease)   . Hemorrhoids   . Hyperlipidemia   .  Subjective:  Patient ID: Kathleen Russo, female    DOB: 06/30/1960  Age: 60 y.o. MRN: [id number]  Chief Complaint  Patient presents with  . Hypertension  . Diabetes    HPI Hyponatremia:found last week at the rheumatologist. Na was 123. Is not drinking too much water   Hypertension: On lisinopril/hctz 10/12.5 mg once daily.   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 Sugar was 150 -200. On victoza 1.8 mg once daily and Janumet 50/1000 mg 2 daily.  Patient is working on eating healthy. Unable to exercise due to right hip pain. Takes etodolac from RA doctor sometimes.   RA: Patient is currently on methotrexate injections once weekly, folic acid 1 mg twice daily and etodolac 400 mg twice daily. Hypothyroidism: Synthroid 137 mcg once daily in am.  OSA: Sleep Study -patient states it was discussed during her last office visit about having an at home sleep study. She has used a CPAP in the past. Hyperlipidemia: Currenlty on crestor 10 mg once daily. Patient has some issues with recurrent vertigo.  She particularly gets dizzy when she turns her head while in bed.  She has not tried exercises for this.  Current Outpatient Medications on File Prior to Visit  Medication Sig Dispense Refill  . Ascorbic Acid (VITAMIN C) 1000 MG tablet Take 1,000 mg by mouth daily.    . Bioflavonoid Products (QUERCETIN COMPLEX IMMUNE PO) Take by mouth.    . zinc gluconate 50 MG tablet Take 50 mg by mouth daily.    . blood glucose meter kit and supplies KIT Dispense based on patient and insurance preference. Check daily in am. E11.69. 1 each 0  . cyanocobalamin (,VITAMIN B-12,) 1000 MCG/ML injection Inject 1 mL (1,000 mcg total) into the muscle once a week. 4 mL 0  . cyclobenzaprine (FLEXERIL) 5 MG tablet Take 5 mg by mouth 3 (three) times daily as needed.    . fluticasone (FLONASE) 50 MCG/ACT nasal spray Place 2 sprays into both nostrils daily. 16 g 2  . folic acid (FOLVITE) 1 MG tablet  Take 1 mg by mouth 2 (two) times daily.    Marland Kitchen levothyroxine (SYNTHROID) 137 MCG tablet TAKE ONE (1) TABLET BY MOUTH ONCE DAILY 90 tablet 2  . liraglutide (VICTOZA) 18 MG/3ML SOPN Inject 1.8 mg into the skin daily. 9 mL 0  . lisinopril-hydrochlorothiazide (ZESTORETIC) 10-12.5 MG tablet TAKE ONE (1) TABLET BY MOUTH ONCE DAILY 90 tablet 2  . methotrexate 250 MG/10ML injection SMARTSIG:0.7 Milliliter(s) SUB-Q Once a Week    . ONETOUCH ULTRA test strip USE TO CHECK BLOOD SUGAR ONCE DAILY IN THE MORNING 50 each 1  . polyethylene glycol (MIRALAX / GLYCOLAX) 17 g packet Take 17 g by mouth daily.    . rosuvastatin (CRESTOR) 10 MG tablet TAKE 1 TABLET DAILY 90 tablet 0  . SitaGLIPtin-MetFORMIN HCl (JANUMET XR) 50-1000 MG TB24 Take 2 tablets by mouth daily. 180 tablet 0  . Vitamin D, Ergocalciferol, (DRISDOL) 1.25 MG (50000 UNIT) CAPS capsule Take 1 capsule (50,000 Units total) by mouth 2 (two) times a week. 24 capsule 0   No current facility-administered medications on file prior to visit.   Past Medical History:  Diagnosis Date  . Anemia   . Arthritis   . Cancer (HCC)    UTERINE  . Diabetes mellitus    DM2, not requiring meds as of 04/2011  . Dizziness   . GERD (gastroesophageal reflux disease)   . Hemorrhoids   . Hyperlipidemia   .  Subjective:  Patient ID: Kathleen Russo, female    DOB: 06/30/1960  Age: 60 y.o. MRN: [id number]  Chief Complaint  Patient presents with  . Hypertension  . Diabetes    HPI Hyponatremia:found last week at the rheumatologist. Na was 123. Is not drinking too much water   Hypertension: On lisinopril/hctz 10/12.5 mg once daily.   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 Sugar was 150 -200. On victoza 1.8 mg once daily and Janumet 50/1000 mg 2 daily.  Patient is working on eating healthy. Unable to exercise due to right hip pain. Takes etodolac from RA doctor sometimes.   RA: Patient is currently on methotrexate injections once weekly, folic acid 1 mg twice daily and etodolac 400 mg twice daily. Hypothyroidism: Synthroid 137 mcg once daily in am.  OSA: Sleep Study -patient states it was discussed during her last office visit about having an at home sleep study. She has used a CPAP in the past. Hyperlipidemia: Currenlty on crestor 10 mg once daily. Patient has some issues with recurrent vertigo.  She particularly gets dizzy when she turns her head while in bed.  She has not tried exercises for this.  Current Outpatient Medications on File Prior to Visit  Medication Sig Dispense Refill  . Ascorbic Acid (VITAMIN C) 1000 MG tablet Take 1,000 mg by mouth daily.    . Bioflavonoid Products (QUERCETIN COMPLEX IMMUNE PO) Take by mouth.    . zinc gluconate 50 MG tablet Take 50 mg by mouth daily.    . blood glucose meter kit and supplies KIT Dispense based on patient and insurance preference. Check daily in am. E11.69. 1 each 0  . cyanocobalamin (,VITAMIN B-12,) 1000 MCG/ML injection Inject 1 mL (1,000 mcg total) into the muscle once a week. 4 mL 0  . cyclobenzaprine (FLEXERIL) 5 MG tablet Take 5 mg by mouth 3 (three) times daily as needed.    . fluticasone (FLONASE) 50 MCG/ACT nasal spray Place 2 sprays into both nostrils daily. 16 g 2  . folic acid (FOLVITE) 1 MG tablet  Take 1 mg by mouth 2 (two) times daily.    Marland Kitchen levothyroxine (SYNTHROID) 137 MCG tablet TAKE ONE (1) TABLET BY MOUTH ONCE DAILY 90 tablet 2  . liraglutide (VICTOZA) 18 MG/3ML SOPN Inject 1.8 mg into the skin daily. 9 mL 0  . lisinopril-hydrochlorothiazide (ZESTORETIC) 10-12.5 MG tablet TAKE ONE (1) TABLET BY MOUTH ONCE DAILY 90 tablet 2  . methotrexate 250 MG/10ML injection SMARTSIG:0.7 Milliliter(s) SUB-Q Once a Week    . ONETOUCH ULTRA test strip USE TO CHECK BLOOD SUGAR ONCE DAILY IN THE MORNING 50 each 1  . polyethylene glycol (MIRALAX / GLYCOLAX) 17 g packet Take 17 g by mouth daily.    . rosuvastatin (CRESTOR) 10 MG tablet TAKE 1 TABLET DAILY 90 tablet 0  . SitaGLIPtin-MetFORMIN HCl (JANUMET XR) 50-1000 MG TB24 Take 2 tablets by mouth daily. 180 tablet 0  . Vitamin D, Ergocalciferol, (DRISDOL) 1.25 MG (50000 UNIT) CAPS capsule Take 1 capsule (50,000 Units total) by mouth 2 (two) times a week. 24 capsule 0   No current facility-administered medications on file prior to visit.   Past Medical History:  Diagnosis Date  . Anemia   . Arthritis   . Cancer (HCC)    UTERINE  . Diabetes mellitus    DM2, not requiring meds as of 04/2011  . Dizziness   . GERD (gastroesophageal reflux disease)   . Hemorrhoids   . Hyperlipidemia   .  Subjective:  Patient ID: Kathleen Russo, female    DOB: 06/30/1960  Age: 60 y.o. MRN: [id number]  Chief Complaint  Patient presents with  . Hypertension  . Diabetes    HPI Hyponatremia:found last week at the rheumatologist. Na was 123. Is not drinking too much water   Hypertension: On lisinopril/hctz 10/12.5 mg once daily.   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 Sugar was 150 -200. On victoza 1.8 mg once daily and Janumet 50/1000 mg 2 daily.  Patient is working on eating healthy. Unable to exercise due to right hip pain. Takes etodolac from RA doctor sometimes.   RA: Patient is currently on methotrexate injections once weekly, folic acid 1 mg twice daily and etodolac 400 mg twice daily. Hypothyroidism: Synthroid 137 mcg once daily in am.  OSA: Sleep Study -patient states it was discussed during her last office visit about having an at home sleep study. She has used a CPAP in the past. Hyperlipidemia: Currenlty on crestor 10 mg once daily. Patient has some issues with recurrent vertigo.  She particularly gets dizzy when she turns her head while in bed.  She has not tried exercises for this.  Current Outpatient Medications on File Prior to Visit  Medication Sig Dispense Refill  . Ascorbic Acid (VITAMIN C) 1000 MG tablet Take 1,000 mg by mouth daily.    . Bioflavonoid Products (QUERCETIN COMPLEX IMMUNE PO) Take by mouth.    . zinc gluconate 50 MG tablet Take 50 mg by mouth daily.    . blood glucose meter kit and supplies KIT Dispense based on patient and insurance preference. Check daily in am. E11.69. 1 each 0  . cyanocobalamin (,VITAMIN B-12,) 1000 MCG/ML injection Inject 1 mL (1,000 mcg total) into the muscle once a week. 4 mL 0  . cyclobenzaprine (FLEXERIL) 5 MG tablet Take 5 mg by mouth 3 (three) times daily as needed.    . fluticasone (FLONASE) 50 MCG/ACT nasal spray Place 2 sprays into both nostrils daily. 16 g 2  . folic acid (FOLVITE) 1 MG tablet  Take 1 mg by mouth 2 (two) times daily.    Marland Kitchen levothyroxine (SYNTHROID) 137 MCG tablet TAKE ONE (1) TABLET BY MOUTH ONCE DAILY 90 tablet 2  . liraglutide (VICTOZA) 18 MG/3ML SOPN Inject 1.8 mg into the skin daily. 9 mL 0  . lisinopril-hydrochlorothiazide (ZESTORETIC) 10-12.5 MG tablet TAKE ONE (1) TABLET BY MOUTH ONCE DAILY 90 tablet 2  . methotrexate 250 MG/10ML injection SMARTSIG:0.7 Milliliter(s) SUB-Q Once a Week    . ONETOUCH ULTRA test strip USE TO CHECK BLOOD SUGAR ONCE DAILY IN THE MORNING 50 each 1  . polyethylene glycol (MIRALAX / GLYCOLAX) 17 g packet Take 17 g by mouth daily.    . rosuvastatin (CRESTOR) 10 MG tablet TAKE 1 TABLET DAILY 90 tablet 0  . SitaGLIPtin-MetFORMIN HCl (JANUMET XR) 50-1000 MG TB24 Take 2 tablets by mouth daily. 180 tablet 0  . Vitamin D, Ergocalciferol, (DRISDOL) 1.25 MG (50000 UNIT) CAPS capsule Take 1 capsule (50,000 Units total) by mouth 2 (two) times a week. 24 capsule 0   No current facility-administered medications on file prior to visit.   Past Medical History:  Diagnosis Date  . Anemia   . Arthritis   . Cancer (HCC)    UTERINE  . Diabetes mellitus    DM2, not requiring meds as of 04/2011  . Dizziness   . GERD (gastroesophageal reflux disease)   . Hemorrhoids   . Hyperlipidemia   .

## 2020-07-24 NOTE — Patient Instructions (Addendum)
Work on restricting sugar/carbs in diet.  Continue janumet until gone.  Change victoza to ozempic 0.25 mg first week, then increase to 0.50 mg once weekly.      How to Perform the Epley Maneuver The Epley maneuver is an exercise that relieves symptoms of vertigo. Vertigo is the feeling that you or your surroundings are moving when they are not. When you feel vertigo, you may feel like the room is spinning and may have trouble walking. The Epley maneuver is used for a type of vertigo caused by a calcium deposit in a part of the inner ear. The maneuver involves changing head positions to help the deposit move out of the area. You can do this maneuver at home whenever you have symptoms of vertigo. You can repeat it in 24 hours if your vertigo has not gone away. Even though the Epley maneuver may relieve your vertigo for a few weeks, it is possible that your symptoms will return. This maneuver relieves vertigo, but it does not relieve dizziness. What are the risks? If it is done correctly, the Epley maneuver is considered safe. Sometimes it can lead to dizziness or nausea that goes away after a short time. If you develop other symptoms-such as changes in vision, weakness, or numbness-stop doing the maneuver and call your health care provider. Supplies needed:  A bed or table.  A pillow. How to do the Epley maneuver 1. Sit on the edge of a bed or table with your back straight and your legs extended or hanging over the edge of the bed or table. 2. Turn your head halfway toward the affected ear or side as told by your health care provider. 3. Lie backward quickly with your head turned until you are lying flat on your back. You may want to position a pillow under your shoulders. 4. Hold this position for at least 30 seconds. If you feel dizzy or have symptoms of vertigo, continue to hold the position until the symptoms stop. 5. Turn your head to the opposite direction until your unaffected ear is  facing the floor. 6. Hold this position for at least 30 seconds. If you feel dizzy or have symptoms of vertigo, continue to hold the position until the symptoms stop. 7. Turn your whole body to the same side as your head so that you are positioned on your side. Your head will now be nearly facedown. Hold for at least 30 seconds. If you feel dizzy or have symptoms of vertigo, continue to hold the position until the symptoms stop. 8. Sit back up. You can repeat the maneuver in 24 hours if your vertigo does not go away.      Follow these instructions at home: For 24 hours after doing the Epley maneuver:  Keep your head in an upright position.  When lying down to sleep or rest, keep your head raised (elevated) with two or more pillows.  Avoid excessive neck movements. Activity  Do not drive or use machinery if you feel dizzy.  After doing the Epley maneuver, return to your normal activities as told by your health care provider. Ask your health care provider what activities are safe for you. General instructions  Drink enough fluid to keep your urine pale yellow.  Do not drink alcohol.  Take over-the-counter and prescription medicines only as told by your health care provider.  Keep all follow-up visits as told by your health care provider. This is important. Preventing vertigo symptoms Ask your health care provider  if there is anything you should do at home to prevent vertigo. He or she may recommend that you:  Keep your head elevated with two or more pillows while you sleep.  Do not sleep on the side of your affected ear.  Get up slowly from bed.  Avoid sudden movements during the day.  Avoid extreme head positions or movement, such as looking up or bending over. Contact a health care provider if:  Your vertigo gets worse.  You have other symptoms, including: ? Nausea. ? Vomiting. ? Headache. Get help right away if you:  Have vision changes.  Have a headache or neck  pain that is severe or getting worse.  Cannot stop vomiting.  Have new numbness or weakness in any part of your body. Summary  Vertigo is the feeling that you or your surroundings are moving when they are not.  The Epley maneuver is an exercise that relieves symptoms of vertigo.  If the Epley maneuver is done correctly, it is considered safe and relieves vertigo quickly. This information is not intended to replace advice given to you by your health care provider. Make sure you discuss any questions you have with your health care provider. Document Revised: 04/05/2019 Document Reviewed: 04/05/2019 Elsevier Patient Education  2021 [employer]

## 2020-07-25 LAB — CBC WITH DIFFERENTIAL/PLATELET
Basophils Absolute: 0.1 10*3/uL (ref 0.0–0.2)
Basos: 1 %
EOS (ABSOLUTE): 0.1 10*3/uL (ref 0.0–0.4)
Eos: 1 %
Hematocrit: 38.7 % (ref 34.0–46.6)
Hemoglobin: 12.5 g/dL (ref 11.1–15.9)
Immature Grans (Abs): 0 10*3/uL (ref 0.0–0.1)
Immature Granulocytes: 1 %
Lymphocytes Absolute: 1.6 10*3/uL (ref 0.7–3.1)
Lymphs: 19 %
MCH: 26.4 pg — ABNORMAL LOW (ref 26.6–33.0)
MCHC: 32.3 g/dL (ref 31.5–35.7)
MCV: 82 fL (ref 79–97)
Monocytes Absolute: 0.5 10*3/uL (ref 0.1–0.9)
Monocytes: 6 %
Neutrophils Absolute: 6.1 10*3/uL (ref 1.4–7.0)
Neutrophils: 72 %
Platelets: 386 10*3/uL (ref 150–450)
RBC: 4.74 x10E6/uL (ref 3.77–5.28)
RDW: 14.7 % (ref 11.7–15.4)
WBC: 8.5 10*3/uL (ref 3.4–10.8)

## 2020-07-25 LAB — COMPREHENSIVE METABOLIC PANEL
ALT: 12 IU/L (ref 0–32)
AST: 16 IU/L (ref 0–40)
Albumin/Globulin Ratio: 2.3 — ABNORMAL HIGH (ref 1.2–2.2)
Albumin: 4.6 g/dL (ref 3.8–4.9)
Alkaline Phosphatase: 122 IU/L — ABNORMAL HIGH (ref 44–121)
BUN/Creatinine Ratio: 18 (ref 12–28)
BUN: 13 mg/dL (ref 8–27)
Bilirubin Total: 0.4 mg/dL (ref 0.0–1.2)
CO2: 26 mmol/L (ref 20–29)
Calcium: 9.3 mg/dL (ref 8.7–10.3)
Chloride: 97 mmol/L (ref 96–106)
Creatinine, Ser: 0.73 mg/dL (ref 0.57–1.00)
GFR calc Af Amer: 104 mL/min/{1.73_m2} (ref >59)
GFR calc non Af Amer: 90 mL/min/{1.73_m2} (ref >59)
Globulin, Total: 2 g/dL (ref 1.5–4.5)
Glucose: 153 mg/dL — ABNORMAL HIGH (ref 65–99)
Potassium: 4 mmol/L (ref 3.5–5.2)
Sodium: 138 mmol/L (ref 134–144)
Total Protein: 6.6 g/dL (ref 6.0–8.5)

## 2020-07-25 LAB — LIPID PANEL
Chol/HDL Ratio: 3.4 ratio (ref 0.0–4.4)
Cholesterol, Total: 155 mg/dL (ref 100–199)
HDL: 46 mg/dL (ref >39)
LDL Chol Calc (NIH): 79 mg/dL (ref 0–99)
Triglycerides: 176 mg/dL — ABNORMAL HIGH (ref 0–149)
VLDL Cholesterol Cal: 30 mg/dL (ref 5–40)

## 2020-07-25 LAB — HEMOGLOBIN A1C
Est. average glucose Bld gHb Est-mCnc: 183 mg/dL
Hgb A1c MFr Bld: 8 % — ABNORMAL HIGH (ref 4.8–5.6)

## 2020-07-25 LAB — CARDIOVASCULAR RISK ASSESSMENT

## 2020-07-25 LAB — TSH: TSH: 4.1 u[IU]/mL (ref 0.450–4.500)

## 2020-07-25 LAB — VITAMIN B12: Vitamin B-12: 289 pg/mL (ref 232–1245)

## 2020-07-25 LAB — VITAMIN D 25 HYDROXY (VIT D DEFICIENCY, FRACTURES): Vit D, 25-Hydroxy: 38.1 ng/mL (ref 30.0–100.0)

## 2020-07-26 ENCOUNTER — Other Ambulatory Visit: Payer: Self-pay

## 2020-07-31 ENCOUNTER — Ambulatory Visit: Payer: BC Managed Care – PPO | Admitting: Family Medicine

## 2020-08-15 ENCOUNTER — Telehealth: Payer: Self-pay

## 2020-08-15 DIAGNOSIS — G4733 Obstructive sleep apnea (adult) (pediatric): Secondary | ICD-10-CM

## 2020-08-15 NOTE — Telephone Encounter (Signed)
Home Sleep Study Referral ordered.

## 2020-08-21 NOTE — Progress Notes (Signed)
Subjective:  Patient ID: Kathleen Russo, female    DOB: Nov 05, 1960  Age: 60 y.o. MRN: [id number]  Chief Complaint  Patient presents with  . 1 month follow up    HPI  Diabetes: Sugars 130-150. Checking twice a day. Tolerating ozempic 0.5 mg weekly Ran out of janumet 4 days ago. Her appetite has decreased. No nausea or vomiting.  She is eating healthier. No exercise.   Current Outpatient Medications on File Prior to Visit  Medication Sig Dispense Refill  . Ascorbic Acid (VITAMIN C) 1000 MG tablet Take 1,000 mg by mouth daily.    . Bioflavonoid Products (QUERCETIN COMPLEX IMMUNE PO) Take by mouth.    . blood glucose meter kit and supplies KIT Dispense based on patient and insurance preference. Check daily in am. E11.69. 1 each 0  . cyanocobalamin (,VITAMIN B-12,) 1000 MCG/ML injection Inject 1 mL (1,000 mcg total) into the muscle once a week. 4 mL 0  . cyclobenzaprine (FLEXERIL) 5 MG tablet Take 5 mg by mouth 3 (three) times daily as needed.    . fluticasone (FLONASE) 50 MCG/ACT nasal spray Place 2 sprays into both nostrils daily. 16 g 2  . folic acid (FOLVITE) 1 MG tablet Take 1 mg by mouth 2 (two) times daily.    Marland Kitchen levothyroxine (SYNTHROID) 137 MCG tablet TAKE ONE (1) TABLET BY MOUTH ONCE DAILY 90 tablet 2  . lisinopril-hydrochlorothiazide (ZESTORETIC) 10-12.5 MG tablet TAKE ONE (1) TABLET BY MOUTH ONCE DAILY 90 tablet 2  . methotrexate 250 MG/10ML injection SMARTSIG:0.7 Milliliter(s) SUB-Q Once a Week    . ONETOUCH ULTRA test strip USE TO CHECK BLOOD SUGAR ONCE DAILY IN THE MORNING 50 each 1  . polyethylene glycol (MIRALAX / GLYCOLAX) 17 g packet Take 17 g by mouth daily.    . Vitamin D, Ergocalciferol, (DRISDOL) 1.25 MG (50000 UNIT) CAPS capsule Take 1 capsule (50,000 Units total) by mouth 2 (two) times a week. 24 capsule 0  . zinc gluconate 50 MG tablet Take 50 mg by mouth daily.     No current facility-administered medications on file prior to visit.   Past Medical History:   Diagnosis Date  . Anemia   . Arthritis   . Cancer (HCC)    UTERINE  . Diabetes mellitus    DM2, not requiring meds as of 04/2011  . Dizziness   . GERD (gastroesophageal reflux disease)   . Hemorrhoids   . Hyperlipidemia   . Hypertension    not requiring meds as of 04/2011  . Hypothyroidism   . Lichen sclerosus   . PONV (postoperative nausea and vomiting)   . Primary osteoarthritis   . Sleep apnea    USES CPAP  . Swelling of both ankles   . Type 2 diabetes mellitus (HCC)    Past Surgical History:  Procedure Laterality Date  . ABDOMINAL HYSTERECTOMY    . BARIATRIC SURGERY    . BREAST LUMPECTOMY Left   . BREAST SURGERY  1979  . ELBOW SURGERY  1990'S  . GASTRIC BYPASS  2007  . JOINT REPLACEMENT  2002   RT. KNEE  . KNEE ARTHROSCOPY  1993   LT. KNEE  . KNEE ARTHROSCOPY W/ LATERAL RELEASE  1987   RT. KNEE  . left hip replacement  05/2017  . TONSILLECTOMY    . TOTAL KNEE ARTHROPLASTY  07/06/2011   Procedure: TOTAL KNEE ARTHROPLASTY;  Surgeon: Raymon Mutton, MD;  Location: [facility] OR;  Service: Orthopedics;  Laterality: Left;  Family History  Problem Relation Age of Onset  . Hypertension Mother   . Thyroid disease Mother   . Diabetes Mother   . Other Father        Wegoners disease  . Thyroid disease Sister   . Graves' disease Brother    Social History   Socioeconomic History  . Marital status: Married    Spouse name: Not on file  . Number of children: Not on file  . Years of education: Not on file  . Highest education level: Not on file  Occupational History  . Occupation: retired  Tobacco Use  . Smoking status: Never Smoker  . Smokeless tobacco: Never Used  Vaping Use  . Vaping Use: Never used  Substance and Sexual Activity  . Alcohol use: No  . Drug use: No  . Sexual activity: Yes    Partners: Male    Birth control/protection: Surgical    Comment: hysterectomy  Other Topics Concern  . Not on file  Social History Narrative  . Not on file   Social  Determinants of Health   Financial Resource Strain: Not on file  Food Insecurity: Not on file  Transportation Needs: Not on file  Physical Activity: Not on file  Stress: Not on file  Social Connections: Not on file    Review of Systems  Constitutional: Negative for chills, fatigue and fever.  HENT: Positive for rhinorrhea. Negative for congestion, ear pain and sore throat.   Respiratory: Negative for cough and shortness of breath.   Cardiovascular: Positive for leg swelling. Negative for chest pain and palpitations.  Gastrointestinal: Negative for abdominal pain, constipation, diarrhea, nausea and vomiting.  Endocrine: Negative for polydipsia and polyphagia.  Genitourinary: Negative for dysuria and urgency.  Musculoskeletal: Negative for back pain and myalgias.  Neurological: Negative for dizziness, weakness, light-headedness and headaches.  Psychiatric/Behavioral: Negative for dysphoric mood. The patient is not nervous/anxious.      Objective:  BP 132/72   Pulse (!) 104   Temp (!) 97.4 F (36.3 C)   Resp 18   Ht 5\' 6"  (1.676 m)   Wt 267 lb (121.1 kg)   BMI 43.09 kg/m   BP/Weight 08/22/2020 07/24/2020 04/23/2020  Systolic BP 132 130 118  Diastolic BP 72 72 62  Wt. (Lbs) 267 268 269  BMI 43.09 43.26 43.42    Physical Exam Vitals reviewed.  Constitutional:      Appearance: Normal appearance. She is obese.  Cardiovascular:     Rate and Rhythm: Normal rate and regular rhythm.     Heart sounds: Normal heart sounds.  Pulmonary:     Effort: Pulmonary effort is normal. No respiratory distress.     Breath sounds: Normal breath sounds.  Abdominal:     General: Bowel sounds are normal.     Palpations: Abdomen is soft.     Tenderness: There is no abdominal tenderness.  Neurological:     Mental Status: She is alert and oriented to person, place, and time.  Psychiatric:        Mood and Affect: Mood normal.        Behavior: Behavior normal.     Diabetic Foot Exam - Simple    No data filed      Lab Results  Component Value Date   WBC 8.5 07/24/2020   HGB 12.5 07/24/2020   HCT 38.7 07/24/2020   PLT 386 07/24/2020   GLUCOSE 153 (H) 07/24/2020   CHOL 155 07/24/2020   TRIG 176 (H) 07/24/2020  HDL 46 07/24/2020   LDLCALC 79 07/24/2020   ALT 12 07/24/2020   AST 16 07/24/2020   NA 138 07/24/2020   K 4.0 07/24/2020   CL 97 07/24/2020   CREATININE 0.73 07/24/2020   BUN 13 07/24/2020   CO2 26 07/24/2020   TSH 4.100 07/24/2020   INR 0.92 06/25/2011   HGBA1C 8.0 (H) 07/24/2020   MICROALBUR 30 07/24/2020      Assessment & Plan:   1. Combined hyperlipidemia associated with type 2 diabetes mellitus (HCC) Control: improved. Recommend check sugars fasting daily. Recommend check feet daily. Recommend annual eye exams. Medicines: Increase ozempic to 1 mg weekly. Remain off Janumet. Will not start metformin at this time as hope increase in ozempic will control sugars and help with weight loss.  Continue to work on eating a healthy diet and exercise.  - Semaglutide, 1 MG/DOSE, 2 MG/1.5ML SOPN; Inject 1 mg into the skin once a week.  Dispense: 9 mL; Refill: 0  2. Mixed hyperlipidemia Continue crestor.  - rosuvastatin (CRESTOR) 10 MG tablet; Take 1 tablet (10 mg total) by mouth daily.  Dispense: 90 tablet; Refill: 0    Meds ordered this encounter  Medications  . rosuvastatin (CRESTOR) 10 MG tablet    Sig: Take 1 tablet (10 mg total) by mouth daily.    Dispense:  90 tablet    Refill:  0  . Semaglutide, 1 MG/DOSE, 2 MG/1.5ML SOPN    Sig: Inject 1 mg into the skin once a week.    Dispense:  9 mL    Refill:  0   Follow-up: Return in about 2 months (around 10/22/2020) for fasting.  An After Visit Summary was printed and given to the patient.  Blane Ohara, MD [facility] [phone]

## 2020-08-22 ENCOUNTER — Ambulatory Visit: Payer: BC Managed Care – PPO | Admitting: Family Medicine

## 2020-08-22 ENCOUNTER — Other Ambulatory Visit: Payer: Self-pay

## 2020-08-22 ENCOUNTER — Ambulatory Visit (INDEPENDENT_AMBULATORY_CARE_PROVIDER_SITE_OTHER): Payer: BC Managed Care – PPO | Admitting: Family Medicine

## 2020-08-22 VITALS — BP 132/72 | HR 104 | Temp 97.4°F | Resp 18 | Ht 66.0 in | Wt 267.0 lb

## 2020-08-22 DIAGNOSIS — E1169 Type 2 diabetes mellitus with other specified complication: Secondary | ICD-10-CM

## 2020-08-22 DIAGNOSIS — E1165 Type 2 diabetes mellitus with hyperglycemia: Secondary | ICD-10-CM

## 2020-08-22 DIAGNOSIS — E782 Mixed hyperlipidemia: Secondary | ICD-10-CM

## 2020-08-22 MED ORDER — SEMAGLUTIDE (1 MG/DOSE) 2 MG/1.5ML ~~LOC~~ SOPN: 1.0000 mg | PEN_INJECTOR | SUBCUTANEOUS | 0 refills | Status: DC

## 2020-08-22 MED ORDER — ROSUVASTATIN CALCIUM 10 MG PO TABS: 10.0000 mg | ORAL_TABLET | Freq: Every day | ORAL | 0 refills | Status: DC

## 2020-08-22 NOTE — Patient Instructions (Signed)
Increase ozempic to 1 mg weekly.  Remain off metformin.  GOOD Job checking sugars.

## 2020-08-23 ENCOUNTER — Encounter: Payer: Self-pay | Admitting: Family Medicine

## 2020-10-22 ENCOUNTER — Other Ambulatory Visit: Payer: Self-pay | Admitting: Legal Medicine

## 2020-10-23 ENCOUNTER — Encounter: Payer: Self-pay | Admitting: Family Medicine

## 2020-10-23 ENCOUNTER — Other Ambulatory Visit: Payer: Self-pay

## 2020-10-23 ENCOUNTER — Ambulatory Visit: Payer: BC Managed Care – PPO | Admitting: Family Medicine

## 2020-10-23 VITALS — BP 118/60 | HR 76 | Temp 97.4°F | Resp 18 | Ht 66.0 in | Wt 260.0 lb

## 2020-10-23 DIAGNOSIS — G4733 Obstructive sleep apnea (adult) (pediatric): Secondary | ICD-10-CM | POA: Diagnosis not present

## 2020-10-23 DIAGNOSIS — I10 Essential (primary) hypertension: Secondary | ICD-10-CM

## 2020-10-23 DIAGNOSIS — E782 Mixed hyperlipidemia: Secondary | ICD-10-CM

## 2020-10-23 DIAGNOSIS — E538 Deficiency of other specified B group vitamins: Secondary | ICD-10-CM

## 2020-10-23 DIAGNOSIS — M05742 Rheumatoid arthritis with rheumatoid factor of left hand without organ or systems involvement: Secondary | ICD-10-CM

## 2020-10-23 DIAGNOSIS — E039 Hypothyroidism, unspecified: Secondary | ICD-10-CM

## 2020-10-23 DIAGNOSIS — E1169 Type 2 diabetes mellitus with other specified complication: Secondary | ICD-10-CM

## 2020-10-23 DIAGNOSIS — M05741 Rheumatoid arthritis with rheumatoid factor of right hand without organ or systems involvement: Secondary | ICD-10-CM

## 2020-10-23 DIAGNOSIS — E559 Vitamin D deficiency, unspecified: Secondary | ICD-10-CM

## 2020-10-23 MED ORDER — LEVOTHYROXINE SODIUM 137 MCG PO TABS: ORAL_TABLET | ORAL | 2 refills | Status: DC

## 2020-10-23 MED ORDER — CLOBETASOL PROPIONATE 0.05 % EX OINT: 1.0000 "application " | TOPICAL_OINTMENT | Freq: Two times a day (BID) | CUTANEOUS | 1 refills | Status: DC

## 2020-10-23 MED ORDER — MONTELUKAST SODIUM 10 MG PO TABS: 10.0000 mg | ORAL_TABLET | Freq: Every day | ORAL | 3 refills | Status: DC

## 2020-10-23 NOTE — Patient Instructions (Signed)
Recommend compression socks and elevation of legs.  No other changes.  Recommend continue to work on eating healthy diet and exercise.

## 2020-10-23 NOTE — Progress Notes (Signed)
Subjective:  Patient ID: Kathleen Russo, female    DOB: June 10, 1961  Age: 60 y.o. MRN: [id number]  Chief Complaint  Patient presents with  . Hypertension  . Hyperlipidemia    HPI Patient presents for follow-up of hypertension, diabetes, hypothyroidism, hyperlipidemia, and GERD. Hypothyroidism: Currently on levothyroxine 137 mcg once daily in a.m. Diabetes: lost 7 lbs. Sugars 125 -150. Eating healthy and moderate exercise. Her leg swells and feels numb when it swells. Currently on Ozempic 1 mg weekly. Hyperlipidemia: Currently on Crestor 10 mg once daily. Hypertension: Currently on lisinopril hydrochlorothiazide 10/12.5 once daily.  Current Outpatient Medications on File Prior to Visit  Medication Sig Dispense Refill  . Ascorbic Acid (VITAMIN C) 1000 MG tablet Take 1,000 mg by mouth daily.    . Bioflavonoid Products (QUERCETIN COMPLEX IMMUNE PO) Take by mouth.    . blood glucose meter kit and supplies KIT Dispense based on patient and insurance preference. Check daily in am. E11.69. 1 each 0  . cyclobenzaprine (FLEXERIL) 5 MG tablet Take 5 mg by mouth 3 (three) times daily as needed.    . fluticasone (FLONASE) 50 MCG/ACT nasal spray Place 2 sprays into both nostrils daily. 16 g 2  . lisinopril-hydrochlorothiazide (ZESTORETIC) 10-12.5 MG tablet TAKE ONE (1) TABLET BY MOUTH ONCE DAILY 90 tablet 2  . methotrexate 250 MG/10ML injection SMARTSIG:0.7 Milliliter(s) SUB-Q Once a Week    . ONETOUCH ULTRA test strip USE TO CHECK BLOOD SUGAR ONCE DAILY IN THE MORNING 50 each 1  . polyethylene glycol (MIRALAX / GLYCOLAX) 17 g packet Take 17 g by mouth daily.    . rosuvastatin (CRESTOR) 10 MG tablet Take 1 tablet (10 mg total) by mouth daily. 90 tablet 0  . zinc gluconate 50 MG tablet Take 50 mg by mouth daily.     No current facility-administered medications on file prior to visit.   Past Medical History:  Diagnosis Date  . Anemia   . Arthritis   . Cancer (HCC)    UTERINE  . Diabetes  mellitus    DM2, not requiring meds as of 04/2011  . Dizziness   . GERD (gastroesophageal reflux disease)   . Hemorrhoids   . Hyperlipidemia   . Hypertension    not requiring meds as of 04/2011  . Hypothyroidism   . Lichen sclerosus   . PONV (postoperative nausea and vomiting)   . Primary osteoarthritis   . Sleep apnea    USES CPAP  . Swelling of both ankles   . Type 2 diabetes mellitus (HCC)    Past Surgical History:  Procedure Laterality Date  . ABDOMINAL HYSTERECTOMY    . BARIATRIC SURGERY    . BREAST LUMPECTOMY Left   . BREAST SURGERY  1979  . ELBOW SURGERY  1990'S  . GASTRIC BYPASS  2007  . JOINT REPLACEMENT  2002   RT. KNEE  . KNEE ARTHROSCOPY  1993   LT. KNEE  . KNEE ARTHROSCOPY W/ LATERAL RELEASE  1987   RT. KNEE  . left hip replacement  05/2017  . TONSILLECTOMY    . TOTAL KNEE ARTHROPLASTY  07/06/2011   Procedure: TOTAL KNEE ARTHROPLASTY;  Surgeon: Raymon Mutton, MD;  Location: [facility] OR;  Service: Orthopedics;  Laterality: Left;    Family History  Problem Relation Age of Onset  . Hypertension Mother   . Thyroid disease Mother   . Diabetes Mother   . Other Father        Wegoners disease  . Thyroid disease  Sister   . Graves' disease Brother    Social History   Socioeconomic History  . Marital status: Married    Spouse name: Not on file  . Number of children: Not on file  . Years of education: Not on file  . Highest education level: Not on file  Occupational History  . Occupation: retired  Tobacco Use  . Smoking status: Never Smoker  . Smokeless tobacco: Never Used  Vaping Use  . Vaping Use: Never used  Substance and Sexual Activity  . Alcohol use: No  . Drug use: No  . Sexual activity: Yes    Partners: Male    Birth control/protection: Surgical    Comment: hysterectomy  Other Topics Concern  . Not on file  Social History Narrative  . Not on file   Social Determinants of Health   Financial Resource Strain: Not on file  Food Insecurity:  Not on file  Transportation Needs: Not on file  Physical Activity: Not on file  Stress: Not on file  Social Connections: Not on file    Review of Systems  Constitutional: Negative for chills, fatigue and fever.  HENT: Positive for congestion and rhinorrhea. Negative for ear pain and sore throat.   Respiratory: Negative for cough and shortness of breath.   Cardiovascular: Negative for chest pain.  Gastrointestinal: Negative for abdominal pain, constipation, diarrhea, nausea and vomiting.  Genitourinary: Negative for dysuria and urgency.  Musculoskeletal: Positive for arthralgias and back pain. Negative for myalgias.  Skin: Negative for rash.  Neurological: Negative for dizziness and headaches.  Psychiatric/Behavioral: Negative for dysphoric mood. The patient is not nervous/anxious.      Objective:  BP 118/60   Pulse 76   Temp (!) 97.4 F (36.3 C)   Resp 18   Ht 5\' 6"  (1.676 m)   Wt 260 lb (117.9 kg)   BMI 41.97 kg/m   BP/Weight 10/23/2020 08/22/2020 07/24/2020  Systolic BP 118 132 130  Diastolic BP 60 72 72  Wt. (Lbs) 260 267 268  BMI 41.97 43.09 43.26    Physical Exam Vitals reviewed.  Constitutional:      Appearance: Normal appearance. She is obese.  Neck:     Vascular: No carotid bruit.  Cardiovascular:     Rate and Rhythm: Normal rate and regular rhythm.     Pulses: Normal pulses.     Heart sounds: Normal heart sounds.  Pulmonary:     Effort: Pulmonary effort is normal. No respiratory distress.     Breath sounds: Normal breath sounds.  Abdominal:     General: Abdomen is flat. Bowel sounds are normal.     Palpations: Abdomen is soft.     Tenderness: There is no abdominal tenderness.  Neurological:     Mental Status: She is alert and oriented to person, place, and time.  Psychiatric:        Mood and Affect: Mood normal.        Behavior: Behavior normal.     Diabetic Foot Exam - Simple   Simple Foot Form Diabetic Foot exam was performed with the following  findings: Yes 10/23/2020 10:30 AM  Visual Inspection No deformities, no ulcerations, no other skin breakdown bilaterally: Yes Sensation Testing Intact to touch and monofilament testing bilaterally: Yes Pulse Check Posterior Tibialis and Dorsalis pulse intact bilaterally: Yes Comments      Lab Results  Component Value Date   WBC 7.8 10/23/2020   HGB 12.3 10/23/2020   HCT 38.5 10/23/2020   PLT  359 10/23/2020   GLUCOSE 124 (H) 10/23/2020   CHOL 123 10/23/2020   TRIG 125 10/23/2020   HDL 41 10/23/2020   LDLCALC 60 10/23/2020   ALT 9 10/23/2020   AST 9 10/23/2020   NA 142 10/23/2020   K 4.5 10/23/2020   CL 101 10/23/2020   CREATININE 0.64 10/23/2020   BUN 14 10/23/2020   CO2 24 10/23/2020   TSH 4.100 07/24/2020   INR 0.92 06/25/2011   HGBA1C 7.1 (H) 10/23/2020   MICROALBUR 30 07/24/2020      Assessment & Plan:   1. Mixed hyperlipidemia The current medical regimen is effective;  continue present plan and medications. Continue to work on eating a healthy diet and exercise.  Labs drawn today.   - Lipid panel  2. OSA (obstructive sleep apnea) Wear cpap  3. Essential hypertension Well controlled.  No changes to medicines.  Continue to work on eating a healthy diet and exercise.   4. Acquired hypothyroidism The current medical regimen is effective;  continue present plan and medications.  5. Combined hyperlipidemia associated with type 2 diabetes mellitus (HCC) Control: very good, but would like less than A1C <6.5 Recommend check sugars fasting daily. Recommend check feet daily. Recommend annual eye exams. Medicines: Greatly improved. Pt is young and I would love her a1c to be even a little better (<6.5.) I would recommend we increase ozempic to 2 mg weekly.  - CBC with Differential/Platelet - Comprehensive metabolic panel - Hemoglobin A1c  6. B12 deficiency B12 level low. Pt needs to stay on b12 shots. Recommend one weekly for 4 weeks, then go to once a  month. - Vitamin B12  7. Vitamin D insufficiency - VITAMIN D 25 Hydroxy (Vit-D Deficiency, Fractures) Vitamin D low normal. Pt needs to stay on vitamin D 16109 1 twice a week.   8. Rheumatoid arthritis involving both hands with positive rheumatoid factor (HCC)  The current medical regimen is effective;  continue present plan and medications. Management per specialist.    Meds ordered this encounter  Medications  . levothyroxine (SYNTHROID) 137 MCG tablet    Sig: TAKE ONE (1) TABLET BY MOUTH ONCE DAILY    Dispense:  90 tablet    Refill:  2  . clobetasol ointment (TEMOVATE) 0.05 %    Sig: Apply 1 application topically 2 (two) times daily.    Dispense:  60 g    Refill:  1  . montelukast (SINGULAIR) 10 MG tablet    Sig: Take 1 tablet (10 mg total) by mouth at bedtime.    Dispense:  90 tablet    Refill:  3    Orders Placed This Encounter  Procedures  . CBC with Differential/Platelet  . Comprehensive metabolic panel  . Hemoglobin A1c  . Lipid panel  . Vitamin B12  . VITAMIN D 25 Hydroxy (Vit-D Deficiency, Fractures)  . Cardiovascular Risk Assessment     Follow-up: Return in about 3 months (around 01/23/2021) for fasting.  An After Visit Summary was printed and given to the patient.  Blane Ohara, MD [facility] [phone]

## 2020-10-24 LAB — CBC WITH DIFFERENTIAL/PLATELET
Basophils Absolute: 0 10*3/uL (ref 0.0–0.2)
Basos: 1 %
EOS (ABSOLUTE): 0.1 10*3/uL (ref 0.0–0.4)
Eos: 1 %
Hematocrit: 38.5 % (ref 34.0–46.6)
Hemoglobin: 12.3 g/dL (ref 11.1–15.9)
Immature Grans (Abs): 0 10*3/uL (ref 0.0–0.1)
Immature Granulocytes: 0 %
Lymphocytes Absolute: 1.4 10*3/uL (ref 0.7–3.1)
Lymphs: 18 %
MCH: 26.5 pg — ABNORMAL LOW (ref 26.6–33.0)
MCHC: 31.9 g/dL (ref 31.5–35.7)
MCV: 83 fL (ref 79–97)
Monocytes Absolute: 0.7 10*3/uL (ref 0.1–0.9)
Monocytes: 9 %
Neutrophils Absolute: 5.6 10*3/uL (ref 1.4–7.0)
Neutrophils: 71 %
Platelets: 359 10*3/uL (ref 150–450)
RBC: 4.64 x10E6/uL (ref 3.77–5.28)
RDW: 14.9 % (ref 11.7–15.4)
WBC: 7.8 10*3/uL (ref 3.4–10.8)

## 2020-10-24 LAB — HEMOGLOBIN A1C
Est. average glucose Bld gHb Est-mCnc: 157 mg/dL
Hgb A1c MFr Bld: 7.1 % — ABNORMAL HIGH (ref 4.8–5.6)

## 2020-10-24 LAB — COMPREHENSIVE METABOLIC PANEL
ALT: 9 IU/L (ref 0–32)
AST: 9 IU/L (ref 0–40)
Albumin/Globulin Ratio: 2 (ref 1.2–2.2)
Albumin: 4.2 g/dL (ref 3.8–4.9)
Alkaline Phosphatase: 96 IU/L (ref 44–121)
BUN/Creatinine Ratio: 22 (ref 12–28)
BUN: 14 mg/dL (ref 8–27)
Bilirubin Total: 0.4 mg/dL (ref 0.0–1.2)
CO2: 24 mmol/L (ref 20–29)
Calcium: 9 mg/dL (ref 8.7–10.3)
Chloride: 101 mmol/L (ref 96–106)
Creatinine, Ser: 0.64 mg/dL (ref 0.57–1.00)
Globulin, Total: 2.1 g/dL (ref 1.5–4.5)
Glucose: 124 mg/dL — ABNORMAL HIGH (ref 65–99)
Potassium: 4.5 mmol/L (ref 3.5–5.2)
Sodium: 142 mmol/L (ref 134–144)
Total Protein: 6.3 g/dL (ref 6.0–8.5)
eGFR: 101 mL/min/{1.73_m2} (ref >59)

## 2020-10-24 LAB — CARDIOVASCULAR RISK ASSESSMENT

## 2020-10-24 LAB — LIPID PANEL
Chol/HDL Ratio: 3 ratio (ref 0.0–4.4)
Cholesterol, Total: 123 mg/dL (ref 100–199)
HDL: 41 mg/dL (ref >39)
LDL Chol Calc (NIH): 60 mg/dL (ref 0–99)
Triglycerides: 125 mg/dL (ref 0–149)
VLDL Cholesterol Cal: 22 mg/dL (ref 5–40)

## 2020-10-24 LAB — VITAMIN B12: Vitamin B-12: 200 pg/mL — ABNORMAL LOW (ref 232–1245)

## 2020-10-24 LAB — VITAMIN D 25 HYDROXY (VIT D DEFICIENCY, FRACTURES): Vit D, 25-Hydroxy: 32.1 ng/mL (ref 30.0–100.0)

## 2020-10-25 ENCOUNTER — Other Ambulatory Visit: Payer: Self-pay

## 2020-10-25 MED ORDER — VITAMIN D (ERGOCALCIFEROL) 1.25 MG (50000 UNIT) PO CAPS: 50000.0000 [IU] | ORAL_CAPSULE | ORAL | 0 refills | Status: DC

## 2020-10-25 MED ORDER — CYANOCOBALAMIN 1000 MCG/ML IJ SOLN: 1000.0000 ug | INTRAMUSCULAR | 0 refills | Status: DC

## 2020-10-28 ENCOUNTER — Other Ambulatory Visit: Payer: Self-pay

## 2020-10-28 ENCOUNTER — Other Ambulatory Visit: Payer: Self-pay | Admitting: Family Medicine

## 2020-10-28 MED ORDER — FOLIC ACID 1 MG PO TABS
1.0000 mg | ORAL_TABLET | Freq: Two times a day (BID) | ORAL | 3 refills | Status: DC
Start: 2020-10-28 — End: 2022-06-02

## 2020-10-29 MED ORDER — VITAMIN D (ERGOCALCIFEROL) 1.25 MG (50000 UNIT) PO CAPS: 50000.0000 [IU] | ORAL_CAPSULE | ORAL | 0 refills | Status: DC

## 2020-10-30 ENCOUNTER — Other Ambulatory Visit: Payer: Self-pay

## 2020-10-30 DIAGNOSIS — E1169 Type 2 diabetes mellitus with other specified complication: Secondary | ICD-10-CM

## 2020-10-30 MED ORDER — SEMAGLUTIDE (1 MG/DOSE) 2 MG/1.5ML ~~LOC~~ SOPN: 2.0000 mg | PEN_INJECTOR | SUBCUTANEOUS | 0 refills | Status: DC

## 2020-10-31 ENCOUNTER — Telehealth: Payer: Self-pay

## 2020-10-31 NOTE — Telephone Encounter (Signed)
PA submitted and approved 10/30/2020--10/30/2021 for ozempic via covermymeds.

## 2020-10-31 NOTE — Telephone Encounter (Signed)
Telephone note - encounter opened in error 

## 2020-11-08 ENCOUNTER — Other Ambulatory Visit: Payer: Self-pay

## 2020-11-08 DIAGNOSIS — E1169 Type 2 diabetes mellitus with other specified complication: Secondary | ICD-10-CM

## 2020-11-08 MED ORDER — SEMAGLUTIDE (1 MG/DOSE) 2 MG/1.5ML ~~LOC~~ SOPN: 2.0000 mg | PEN_INJECTOR | SUBCUTANEOUS | 0 refills | Status: DC

## 2020-11-25 ENCOUNTER — Other Ambulatory Visit: Payer: Self-pay | Admitting: Family Medicine

## 2020-11-25 DIAGNOSIS — E782 Mixed hyperlipidemia: Secondary | ICD-10-CM

## 2020-12-02 ENCOUNTER — Other Ambulatory Visit: Payer: Self-pay | Admitting: Nurse Practitioner

## 2020-12-02 DIAGNOSIS — E1165 Type 2 diabetes mellitus with hyperglycemia: Secondary | ICD-10-CM

## 2020-12-02 MED ORDER — OZEMPIC (1 MG/DOSE) 4 MG/3ML ~~LOC~~ SOPN
1.0000 mg | PEN_INJECTOR | SUBCUTANEOUS | 1 refills | Status: DC
Start: 2020-12-02 — End: 2021-05-05

## 2020-12-31 ENCOUNTER — Other Ambulatory Visit: Payer: Self-pay | Admitting: Family Medicine

## 2020-12-31 DIAGNOSIS — Z1231 Encounter for screening mammogram for malignant neoplasm of breast: Secondary | ICD-10-CM

## 2021-01-24 ENCOUNTER — Other Ambulatory Visit: Payer: Self-pay

## 2021-01-24 ENCOUNTER — Encounter: Payer: Self-pay | Admitting: Family Medicine

## 2021-01-24 ENCOUNTER — Ambulatory Visit: Payer: BC Managed Care – PPO | Admitting: Family Medicine

## 2021-01-24 VITALS — BP 124/64 | HR 78 | Temp 96.5°F | Resp 18 | Ht 66.0 in | Wt 255.6 lb

## 2021-01-24 DIAGNOSIS — I10 Essential (primary) hypertension: Secondary | ICD-10-CM

## 2021-01-24 DIAGNOSIS — E1165 Type 2 diabetes mellitus with hyperglycemia: Secondary | ICD-10-CM

## 2021-01-24 DIAGNOSIS — R059 Cough, unspecified: Secondary | ICD-10-CM

## 2021-01-24 DIAGNOSIS — E039 Hypothyroidism, unspecified: Secondary | ICD-10-CM | POA: Diagnosis not present

## 2021-01-24 DIAGNOSIS — E782 Mixed hyperlipidemia: Secondary | ICD-10-CM | POA: Diagnosis not present

## 2021-01-24 DIAGNOSIS — M05741 Rheumatoid arthritis with rheumatoid factor of right hand without organ or systems involvement: Secondary | ICD-10-CM

## 2021-01-24 DIAGNOSIS — M05742 Rheumatoid arthritis with rheumatoid factor of left hand without organ or systems involvement: Secondary | ICD-10-CM

## 2021-01-24 MED ORDER — CYCLOBENZAPRINE HCL 5 MG PO TABS: 5.0000 mg | ORAL_TABLET | Freq: Three times a day (TID) | ORAL | 1 refills | Status: DC | PRN

## 2021-01-24 NOTE — Progress Notes (Signed)
Established Patient Office Visit  Subjective:  Patient ID: Kathleen Russo, female    DOB: 1961/01/18  Age: 60 y.o. MRN: [id number]  CC: Diabetes HPI Kathleen Russo presents for follow-up of diabetes, hypertension, hypothyroidism, and hyperlipidemia. She also reports a dry cough that has lasted for a couple of months. She denies anything that exacerbates it and has taken Singulair with some improvement. She does report some shortness of breath at times and chest discomfort when taking a deep breath and shortness of breath when flat. She also had complaints of leg swelling but stated that she was sitting for a prolonged period yesterday and her legs usually swell when standing or sitting for a long period of time.  Type 2 diabetes mellitus with hyperglycemia, without long-term current use of insulin - Attempting to eat healthy and taking Ozempic weekly. She checks blood sugars daily blood sugars range from 120-150. Essential hypertension - Attempting to eat a healthy diet and stay active. She is taking Zestoretic daily.She is checking blood pressures at home and usually stays around 120/60. Acquired hypothyroidism - Taking Synthroid 137 mg daily Mixed hyperlipidemia - Attempting to eat healthy and stay active. Taking Crestor 10 mg daily. Rheumatoid Arthritis - Taking Methotrexate weekly on folic acid 1 mg daily. She is followed by rheumatology. B12 deficiency: on B12 1000 mcg injection monthly.   Past Medical History:  Diagnosis Date   Anemia    Arthritis    Cancer (HCC)    UTERINE   Diabetes mellitus    DM2, not requiring meds as of 04/2011   Dizziness    GERD (gastroesophageal reflux disease)    Hemorrhoids    Hyperlipidemia    Hypertension    not requiring meds as of 04/2011   Hypothyroidism    Lichen sclerosus    PONV (postoperative nausea and vomiting)    Primary osteoarthritis    Sleep apnea    USES CPAP   Swelling of both ankles    Type 2 diabetes mellitus  (HCC)     Past Surgical History:  Procedure Laterality Date   ABDOMINAL HYSTERECTOMY     BARIATRIC SURGERY     BREAST LUMPECTOMY Left    BREAST SURGERY  1979   ELBOW SURGERY  1990'S   GASTRIC BYPASS  2007   JOINT REPLACEMENT  2002   RT. KNEE   KNEE ARTHROSCOPY  1993   LT. KNEE   KNEE ARTHROSCOPY W/ LATERAL RELEASE  1987   RT. KNEE   left hip replacement  05/2017   TONSILLECTOMY     TOTAL KNEE ARTHROPLASTY  07/06/2011   Procedure: TOTAL KNEE ARTHROPLASTY;  Surgeon: Raymon Mutton, MD;  Location: [facility] OR;  Service: Orthopedics;  Laterality: Left;    Family History  Problem Relation Age of Onset   Hypertension Mother    Thyroid disease Mother    Diabetes Mother    Other Father        Wegoners disease   Thyroid disease Sister    Graves' disease Brother     Social History   Socioeconomic History   Marital status: Married    Spouse name: Not on file   Number of children: Not on file   Years of education: Not on file   Highest education level: Not on file  Occupational History   Occupation: retired  Tobacco Use   Smoking status: Never   Smokeless tobacco: Never  Vaping Use   Vaping Use: Never used  Substance and Sexual Activity  Alcohol use: No   Drug use: No   Sexual activity: Yes    Partners: Male    Birth control/protection: Surgical    Comment: hysterectomy  Other Topics Concern   Not on file  Social History Narrative   Not on file   Social Determinants of Health   Financial Resource Strain: Not on file  Food Insecurity: Not on file  Transportation Needs: Not on file  Physical Activity: Not on file  Stress: Not on file  Social Connections: Not on file  Intimate Partner Violence: Not on file    Outpatient Medications Prior to Visit  Medication Sig Dispense Refill   Ascorbic Acid (VITAMIN C) 1000 MG tablet Take 1,000 mg by mouth daily.     Bioflavonoid Products (QUERCETIN COMPLEX IMMUNE PO) Take by mouth.     blood glucose meter kit and supplies  KIT Dispense based on patient and insurance preference. Check daily in am. E11.69. 1 each 0   clobetasol ointment (TEMOVATE) 0.05 % Apply 1 application topically 2 (two) times daily. 60 g 1   cyanocobalamin (,VITAMIN B-12,) 1000 MCG/ML injection INJECT INTO THE MUSCLE ONCE A WEEK 4 mL 0   fluticasone (FLONASE) 50 MCG/ACT nasal spray Place 2 sprays into both nostrils daily. 16 g 2   folic acid (FOLVITE) 1 MG tablet Take 1 tablet (1 mg total) by mouth 2 (two) times daily. 180 tablet 3   levothyroxine (SYNTHROID) 137 MCG tablet TAKE ONE (1) TABLET BY MOUTH ONCE DAILY 90 tablet 2   lisinopril-hydrochlorothiazide (ZESTORETIC) 10-12.5 MG tablet TAKE ONE (1) TABLET BY MOUTH ONCE DAILY 90 tablet 2   methotrexate 250 MG/10ML injection SMARTSIG:0.7 Milliliter(s) SUB-Q Once a Week     montelukast (SINGULAIR) 10 MG tablet Take 1 tablet (10 mg total) by mouth at bedtime. 90 tablet 3   ondansetron (ZOFRAN) 4 MG tablet Take 4 mg by mouth daily as needed.     ONETOUCH ULTRA test strip USE TO CHECK BLOOD SUGAR ONCE DAILY IN THE MORNING 50 each 1   polyethylene glycol (MIRALAX / GLYCOLAX) 17 g packet Take 17 g by mouth daily.     rosuvastatin (CRESTOR) 10 MG tablet TAKE ONE (1) TABLET BY MOUTH ONCE DAILY 90 tablet 0   Semaglutide, 1 MG/DOSE, (OZEMPIC, 1 MG/DOSE,) 4 MG/3ML SOPN Inject 1 mg into the skin once a week. 9 mL 1   Vitamin D, Ergocalciferol, (DRISDOL) 1.25 MG (50000 UNIT) CAPS capsule Take 1 capsule (50,000 Units total) by mouth 2 (two) times a week. 24 capsule 0   zinc gluconate 50 MG tablet Take 50 mg by mouth daily.     cyclobenzaprine (FLEXERIL) 5 MG tablet Take 5 mg by mouth 3 (three) times daily as needed.     No facility-administered medications prior to visit.    Allergies  Allergen Reactions   Nsaids     Medications don't absorb   Quinolones     Medications don't absorb   Tolmetin Other (See Comments)    THIS MEDICATION WILL NOT ABSORB D/T GBP Medications don't absorb      ROS Review of Systems  Constitutional:  Negative for chills and fatigue.  HENT:  Positive for congestion and sore throat (at times when coughing).   Eyes:  Negative for visual disturbance.  Respiratory:  Positive for cough and shortness of breath (at times and when laying flat).   Cardiovascular:  Positive for leg swelling. Negative for chest pain and palpitations.  Gastrointestinal:  Negative for abdominal pain,  constipation, diarrhea, nausea and vomiting.  Endocrine: Positive for cold intolerance. Negative for heat intolerance, polyphagia and polyuria.  Genitourinary:  Negative for difficulty urinating, frequency and urgency.  Neurological:  Negative for dizziness, weakness and numbness.  Psychiatric/Behavioral:  The patient is not nervous/anxious.      Objective:    Physical Exam Vitals reviewed.  Constitutional:      Appearance: Normal appearance.  HENT:     Head: Normocephalic.     Right Ear: Tympanic membrane, ear canal and external ear normal.     Left Ear: Tympanic membrane, ear canal and external ear normal.     Nose: Nose normal.     Mouth/Throat:     Mouth: Mucous membranes are moist.  Neck:     Vascular: No carotid bruit.  Cardiovascular:     Rate and Rhythm: Normal rate and regular rhythm.     Pulses: Normal pulses.     Heart sounds: Normal heart sounds.  Pulmonary:     Effort: Pulmonary effort is normal.     Breath sounds: Normal breath sounds.  Abdominal:     General: Abdomen is flat. Bowel sounds are normal.     Palpations: Abdomen is soft.  Musculoskeletal:     Right lower leg: 1+ Pitting Edema present.     Left lower leg: 1+ Pitting Edema present.  Skin:    General: Skin is warm and dry.  Neurological:     General: No focal deficit present.     Mental Status: She is alert and oriented to person, place, and time.  Psychiatric:        Mood and Affect: Mood normal.        Behavior: Behavior normal.   Diabetic Foot Exam - Simple   Simple Foot  Form Visual Inspection No deformities, no ulcerations, no other skin breakdown bilaterally: Yes Sensation Testing Intact to touch and monofilament testing bilaterally: Yes Pulse Check Posterior Tibialis and Dorsalis pulse intact bilaterally: Yes Comments     BP 124/64   Pulse 78   Temp (!) 96.5 F (35.8 C)   Resp 18   Ht 5\' 6"  (1.676 m)   Wt 255 lb 9.6 oz (115.9 kg)   BMI 41.25 kg/m  Wt Readings from Last 3 Encounters:  01/24/21 255 lb 9.6 oz (115.9 kg)  10/23/20 260 lb (117.9 kg)  08/22/20 267 lb (121.1 kg)     Health Maintenance Due  Topic Date Due   PNEUMOCOCCAL POLYSACCHARIDE VACCINE AGE 46-64 HIGH RISK  Never done   OPHTHALMOLOGY EXAM  Never done   HIV Screening  Never done   Hepatitis C Screening  Never done   COLONOSCOPY (Pts 45-46yrs Insurance coverage will need to be confirmed)  05/11/2007   Zoster Vaccines- Shingrix (1 of 2) Never done   COVID-19 Vaccine (3 - Booster) 02/11/2020   MAMMOGRAM  01/18/2021   INFLUENZA VACCINE  01/20/2021    There are no preventive care reminders to display for this patient.  Lab Results  Component Value Date   TSH 4.100 07/24/2020   Lab Results  Component Value Date   WBC 7.8 10/23/2020   HGB 12.3 10/23/2020   HCT 38.5 10/23/2020   MCV 83 10/23/2020   PLT 359 10/23/2020   Lab Results  Component Value Date   NA 142 10/23/2020   K 4.5 10/23/2020   CO2 24 10/23/2020   GLUCOSE 124 (H) 10/23/2020   BUN 14 10/23/2020   CREATININE 0.64 10/23/2020   BILITOT 0.4 10/23/2020  ALKPHOS 96 10/23/2020   AST 9 10/23/2020   ALT 9 10/23/2020   PROT 6.3 10/23/2020   ALBUMIN 4.2 10/23/2020   CALCIUM 9.0 10/23/2020   EGFR 101 10/23/2020   Lab Results  Component Value Date   CHOL 123 10/23/2020   Lab Results  Component Value Date   HDL 41 10/23/2020   Lab Results  Component Value Date   LDLCALC 60 10/23/2020   Lab Results  Component Value Date   TRIG 125 10/23/2020   Lab Results  Component Value Date   CHOLHDL  3.0 10/23/2020   Lab Results  Component Value Date   HGBA1C 7.1 (H) 10/23/2020      Assessment & Plan:   1. Type 2 diabetes mellitus with hyperglycemia, without long-term current use of insulin (HCC) - Well controlled. - Hemoglobin A1c - Continue Ozempic 1 mg SQ weekly.  2. Essential hypertension - Well controlled - Continue Zestoretic 10-12.5 mg po daily - CBC with Differential/Platelet - Comprehensive metabolic panel  3. Acquired hypothyroidism - At goal - Continue Synthroid 137 mg po daily  4. Mixed hyperlipidemia - Controlled - Continue Crestor 10 mg po daily - Lipid panel  5. Cough - DG Chest 2 View  6. Rheumatoid arthritis involving both hands with positive rheumatoid factor (HCC)  - Controlled - Continue Methotrexate 0.7 mg ml SQ weekly - Continue follow-up with rheumatology  7. Morbid obesity BMI 41. Recommend continue to work on eating healthy diet and exercise.  Meds ordered this encounter  Medications   cyclobenzaprine (FLEXERIL) 5 MG tablet    Sig: Take 1 tablet (5 mg total) by mouth 3 (three) times daily as needed.    Dispense:  90 tablet    Refill:  1    Follow-up: Approximately 3 months    Willy Eddy, RN

## 2021-01-25 LAB — COMPREHENSIVE METABOLIC PANEL
ALT: 15 IU/L (ref 0–32)
AST: 9 IU/L (ref 0–40)
Albumin/Globulin Ratio: 1.7 (ref 1.2–2.2)
Albumin: 4.2 g/dL (ref 3.8–4.9)
Alkaline Phosphatase: 123 IU/L — ABNORMAL HIGH (ref 44–121)
BUN/Creatinine Ratio: 21 (ref 12–28)
BUN: 13 mg/dL (ref 8–27)
Bilirubin Total: 0.5 mg/dL (ref 0.0–1.2)
CO2: 25 mmol/L (ref 20–29)
Calcium: 9.6 mg/dL (ref 8.7–10.3)
Chloride: 101 mmol/L (ref 96–106)
Creatinine, Ser: 0.63 mg/dL (ref 0.57–1.00)
Globulin, Total: 2.5 g/dL (ref 1.5–4.5)
Glucose: 132 mg/dL — ABNORMAL HIGH (ref 65–99)
Potassium: 4.5 mmol/L (ref 3.5–5.2)
Sodium: 141 mmol/L (ref 134–144)
Total Protein: 6.7 g/dL (ref 6.0–8.5)
eGFR: 101 mL/min/{1.73_m2} (ref >59)

## 2021-01-25 LAB — LIPID PANEL
Chol/HDL Ratio: 3.2 ratio (ref 0.0–4.4)
Cholesterol, Total: 128 mg/dL (ref 100–199)
HDL: 40 mg/dL (ref >39)
LDL Chol Calc (NIH): 63 mg/dL (ref 0–99)
Triglycerides: 141 mg/dL (ref 0–149)
VLDL Cholesterol Cal: 25 mg/dL (ref 5–40)

## 2021-01-25 LAB — CBC WITH DIFFERENTIAL/PLATELET
Basophils Absolute: 0.1 10*3/uL (ref 0.0–0.2)
Basos: 1 %
EOS (ABSOLUTE): 0.1 10*3/uL (ref 0.0–0.4)
Eos: 1 %
Hematocrit: 37.8 % (ref 34.0–46.6)
Hemoglobin: 12.2 g/dL (ref 11.1–15.9)
Immature Grans (Abs): 0 10*3/uL (ref 0.0–0.1)
Immature Granulocytes: 0 %
Lymphocytes Absolute: 1.4 10*3/uL (ref 0.7–3.1)
Lymphs: 13 %
MCH: 26.8 pg (ref 26.6–33.0)
MCHC: 32.3 g/dL (ref 31.5–35.7)
MCV: 83 fL (ref 79–97)
Monocytes Absolute: 0.8 10*3/uL (ref 0.1–0.9)
Monocytes: 7 %
Neutrophils Absolute: 8.2 10*3/uL — ABNORMAL HIGH (ref 1.4–7.0)
Neutrophils: 78 %
Platelets: 373 10*3/uL (ref 150–450)
RBC: 4.55 x10E6/uL (ref 3.77–5.28)
RDW: 14.5 % (ref 11.7–15.4)
WBC: 10.5 10*3/uL (ref 3.4–10.8)

## 2021-01-25 LAB — HEMOGLOBIN A1C
Est. average glucose Bld gHb Est-mCnc: 151 mg/dL
Hgb A1c MFr Bld: 6.9 % — ABNORMAL HIGH (ref 4.8–5.6)

## 2021-01-25 LAB — CARDIOVASCULAR RISK ASSESSMENT

## 2021-01-25 NOTE — Patient Instructions (Signed)
Diabetic recommendations: Visits with PCP every 3-6 months depending on Hemoglobin A1C control (goal is < 7.0.) Dietary avoidance/limitation of sugar and carbohydrates, fat, and salt. Exercise (aerobic) 3-5 days per week.  Kidney Care:  Urine microalbumin (protein) should be checked at least annually. To treat or prevent nephropathy (leaking of protein in urine) - all patients should be on an ACE or an ARB Medicines. Heart Disease Prevention: All diabetics should be on a statin cholesterol medicine regardless of cholesterol levels. If cholesterol levels are high, recommend treat to goal of LDL cholesterol level of less than 70. Diabetic Eye Care: See an eye doctor at least annually. More if recommended by the eye doctor.  Foot Care: Check feet visually daily. Blood pressure (hypertension) control < 130/85 Vaccinations: annual flu shot, pneumovax 23, tetanus (tdap)   

## 2021-02-10 ENCOUNTER — Other Ambulatory Visit: Payer: Self-pay | Admitting: Family Medicine

## 2021-02-10 DIAGNOSIS — E782 Mixed hyperlipidemia: Secondary | ICD-10-CM

## 2021-02-12 ENCOUNTER — Ambulatory Visit: Payer: BC Managed Care – PPO | Admitting: Physician Assistant

## 2021-02-12 ENCOUNTER — Encounter: Payer: Self-pay | Admitting: Physician Assistant

## 2021-02-12 VITALS — BP 126/70 | HR 78 | Temp 97.2°F | Ht 66.0 in | Wt 252.2 lb

## 2021-02-12 DIAGNOSIS — J060 Acute laryngopharyngitis: Secondary | ICD-10-CM

## 2021-02-12 LAB — POC COVID19 BINAXNOW: SARS Coronavirus 2 Ag: NEGATIVE

## 2021-02-12 MED ORDER — HYDROCOD POLST-CPM POLST ER 10-8 MG/5ML PO SUER: 5.0000 mL | Freq: Two times a day (BID) | ORAL | 0 refills | Status: DC | PRN

## 2021-02-12 MED ORDER — BENZONATATE 100 MG PO CAPS
100.0000 mg | ORAL_CAPSULE | Freq: Two times a day (BID) | ORAL | 2 refills | Status: DC | PRN
Start: 2021-02-12 — End: 2021-08-01

## 2021-02-12 MED ORDER — AMOXICILLIN 875 MG PO TABS: 875.0000 mg | ORAL_TABLET | Freq: Two times a day (BID) | ORAL | 0 refills | Status: AC

## 2021-02-12 NOTE — Progress Notes (Signed)
Acute Office Visit  Subjective:    Patient ID: Kathleen Russo, female    DOB: 03/25/1961, 60 y.o.   MRN: [id number]  Chief Complaint  Patient presents with   Nasal Congestion    HPI Patient is in today for complaints of sinus pressure/pnd and coughing - states symptoms started on 8/5 - states she feels it just won't clear - is coughing up phlegm at times Denies fever or malaise Did have COVID test done before coming in office which was negative  Past Medical History:  Diagnosis Date   Anemia    Arthritis    Cancer (HCC)    UTERINE   Diabetes mellitus    DM2, not requiring meds as of 04/2011   Dizziness    GERD (gastroesophageal reflux disease)    Hemorrhoids    Hyperlipidemia    Hypertension    not requiring meds as of 04/2011   Hypothyroidism    Lichen sclerosus    PONV (postoperative nausea and vomiting)    Primary osteoarthritis    Sleep apnea    USES CPAP   Swelling of both ankles    Type 2 diabetes mellitus (HCC)     Past Surgical History:  Procedure Laterality Date   ABDOMINAL HYSTERECTOMY     BARIATRIC SURGERY     BREAST LUMPECTOMY Left    BREAST SURGERY  1979   ELBOW SURGERY  1990'S   GASTRIC BYPASS  2007   JOINT REPLACEMENT  2002   RT. KNEE   KNEE ARTHROSCOPY  1993   LT. KNEE   KNEE ARTHROSCOPY W/ LATERAL RELEASE  1987   RT. KNEE   left hip replacement  05/2017   TONSILLECTOMY     TOTAL KNEE ARTHROPLASTY  07/06/2011   Procedure: TOTAL KNEE ARTHROPLASTY;  Surgeon: Raymon Mutton, MD;  Location: [facility] OR;  Service: Orthopedics;  Laterality: Left;    Family History  Problem Relation Age of Onset   Hypertension Mother    Thyroid disease Mother    Diabetes Mother    Other Father        Wegoners disease   Thyroid disease Sister    Graves' disease Brother     Social History   Socioeconomic History   Marital status: Married    Spouse name: Not on file   Number of children: Not on file   Years of education: Not on file   Highest  education level: Not on file  Occupational History   Occupation: retired  Tobacco Use   Smoking status: Never   Smokeless tobacco: Never  Vaping Use   Vaping Use: Never used  Substance and Sexual Activity   Alcohol use: No   Drug use: No   Sexual activity: Yes    Partners: Male    Birth control/protection: Surgical    Comment: hysterectomy  Other Topics Concern   Not on file  Social History Narrative   Not on file   Social Determinants of Health   Financial Resource Strain: Not on file  Food Insecurity: Not on file  Transportation Needs: Not on file  Physical Activity: Not on file  Stress: Not on file  Social Connections: Not on file  Intimate Partner Violence: Not on file    Outpatient Medications Prior to Visit  Medication Sig Dispense Refill   Ascorbic Acid (VITAMIN C) 1000 MG tablet Take 1,000 mg by mouth daily.     Bioflavonoid Products (QUERCETIN COMPLEX IMMUNE PO) Take by mouth.  blood glucose meter kit and supplies KIT Dispense based on patient and insurance preference. Check daily in am. E11.69. 1 each 0   clobetasol ointment (TEMOVATE) 0.05 % Apply 1 application topically 2 (two) times daily. 60 g 1   cyanocobalamin (,VITAMIN B-12,) 1000 MCG/ML injection INJECT INTO THE MUSCLE ONCE A WEEK 4 mL 0   cyclobenzaprine (FLEXERIL) 5 MG tablet Take 1 tablet (5 mg total) by mouth 3 (three) times daily as needed. 90 tablet 1   fluticasone (FLONASE) 50 MCG/ACT nasal spray Place 2 sprays into both nostrils daily. 16 g 2   folic acid (FOLVITE) 1 MG tablet Take 1 tablet (1 mg total) by mouth 2 (two) times daily. 180 tablet 3   levothyroxine (SYNTHROID) 137 MCG tablet TAKE ONE (1) TABLET BY MOUTH ONCE DAILY 90 tablet 2   lisinopril-hydrochlorothiazide (ZESTORETIC) 10-12.5 MG tablet TAKE ONE (1) TABLET BY MOUTH ONCE DAILY 90 tablet 2   methotrexate 250 MG/10ML injection SMARTSIG:0.7 Milliliter(s) SUB-Q Once a Week     montelukast (SINGULAIR) 10 MG tablet Take 1 tablet (10  mg total) by mouth at bedtime. 90 tablet 3   ondansetron (ZOFRAN) 4 MG tablet Take 4 mg by mouth daily as needed.     ONETOUCH ULTRA test strip USE TO CHECK BLOOD SUGAR ONCE DAILY IN THE MORNING 50 each 1   polyethylene glycol (MIRALAX / GLYCOLAX) 17 g packet Take 17 g by mouth daily.     rosuvastatin (CRESTOR) 10 MG tablet TAKE ONE (1) TABLET BY MOUTH ONCE DAILY 90 tablet 0   Semaglutide, 1 MG/DOSE, (OZEMPIC, 1 MG/DOSE,) 4 MG/3ML SOPN Inject 1 mg into the skin once a week. 9 mL 1   Vitamin D, Ergocalciferol, (DRISDOL) 1.25 MG (50000 UNIT) CAPS capsule Take 1 capsule (50,000 Units total) by mouth 2 (two) times a week. 24 capsule 0   zinc gluconate 50 MG tablet Take 50 mg by mouth daily.     No facility-administered medications prior to visit.    Allergies  Allergen Reactions   Nsaids     Medications don't absorb   Quinolones     Medications don't absorb   Tolmetin Other (See Comments)    THIS MEDICATION WILL NOT ABSORB D/T GBP Medications don't absorb     Review of Systems CONSTITUTIONAL: Negative for chills, fatigue, fever, unintentional weight gain and unintentional weight loss.  E/N/T: see HPI CARDIOVASCULAR: Negative for chest pain, dizziness, palpitations and pedal edema.  RESPIRATORY:see HPI GASTROINTESTINAL: Negative for abdominal pain, acid reflux symptoms, constipation, diarrhea, nausea and vomiting.      Objective:    Physical Exam PHYSICAL EXAM:   VS: BP 126/70 (BP Location: Left Arm, Patient Position: Sitting, Cuff Size: Normal)   Pulse 78   Temp (!) 97.2 F (36.2 C) (Temporal)   Ht 5\' 6"  (1.676 m)   Wt 252 lb 3.2 oz (114.4 kg)   SpO2 96%   BMI 40.71 kg/m   GEN: Well nourished, well developed, in no acute distress  HEENT: normal external ears and nose - normal external auditory canals and TMS - - Lips, Teeth and Gums - normal  Oropharynx - erythema and pnd noted Cardiac: RRR; no murmurs, rubs, or gallops Respiratory:  normal respiratory rate and pattern  with no distress - normal breath sounds with no rales, rhonchi, wheezes or rubs  Office Visit on 02/12/2021  Component Date Value Ref Range Status   SARS Coronavirus 2 Ag 02/12/2021 Negative  Negative Final    BP 126/70 (  BP Location: Left Arm, Patient Position: Sitting, Cuff Size: Normal)   Pulse 78   Temp (!) 97.2 F (36.2 C) (Temporal)   Ht 5\' 6"  (1.676 m)   Wt 252 lb 3.2 oz (114.4 kg)   SpO2 96%   BMI 40.71 kg/m  Wt Readings from Last 3 Encounters:  02/12/21 252 lb 3.2 oz (114.4 kg)  01/24/21 255 lb 9.6 oz (115.9 kg)  10/23/20 260 lb (117.9 kg)    Health Maintenance Due  Topic Date Due   PNEUMOCOCCAL POLYSACCHARIDE VACCINE AGE 37-64 HIGH RISK  Never done   OPHTHALMOLOGY EXAM  Never done   HIV Screening  Never done   Hepatitis C Screening  Never done   COLONOSCOPY (Pts 45-57yrs Insurance coverage will need to be confirmed)  05/11/2007   Zoster Vaccines- Shingrix (1 of 2) Never done   COVID-19 Vaccine (3 - Booster) 02/11/2020   MAMMOGRAM  01/18/2021   INFLUENZA VACCINE  01/20/2021   PAP SMEAR-Modifier  03/29/2021    There are no preventive care reminders to display for this patient.   Lab Results  Component Value Date   TSH 4.100 07/24/2020   Lab Results  Component Value Date   WBC 10.5 01/24/2021   HGB 12.2 01/24/2021   HCT 37.8 01/24/2021   MCV 83 01/24/2021   PLT 373 01/24/2021   Lab Results  Component Value Date   NA 141 01/24/2021   K 4.5 01/24/2021   CO2 25 01/24/2021   GLUCOSE 132 (H) 01/24/2021   BUN 13 01/24/2021   CREATININE 0.63 01/24/2021   BILITOT 0.5 01/24/2021   ALKPHOS 123 (H) 01/24/2021   AST 9 01/24/2021   ALT 15 01/24/2021   PROT 6.7 01/24/2021   ALBUMIN 4.2 01/24/2021   CALCIUM 9.6 01/24/2021   EGFR 101 01/24/2021   Lab Results  Component Value Date   CHOL 128 01/24/2021   Lab Results  Component Value Date   HDL 40 01/24/2021   Lab Results  Component Value Date   LDLCALC 63 01/24/2021   Lab Results  Component Value  Date   TRIG 141 01/24/2021   Lab Results  Component Value Date   CHOLHDL 3.2 01/24/2021   Lab Results  Component Value Date   HGBA1C 6.9 (H) 01/24/2021       Assessment & Plan:  1. Acute laryngopharyngitis - POC COVID-19 BinaxNow - amoxicillin (AMOXIL) 875 MG tablet; Take 1 tablet (875 mg total) by mouth 2 (two) times daily for 10 days.  Dispense: 20 tablet; Refill: 0 - chlorpheniramine-HYDROcodone (TUSSIONEX PENNKINETIC ER) 10-8 MG/5ML SUER; Take 5 mLs by mouth every 12 (twelve) hours as needed for cough.  Dispense: 115 mL; Refill: 0 - benzonatate (TESSALON) 100 MG capsule; Take 1 capsule (100 mg total) by mouth 2 (two) times daily as needed for cough.  Dispense: 30 capsule; Refill: 2    Meds ordered this encounter  Medications   amoxicillin (AMOXIL) 875 MG tablet    Sig: Take 1 tablet (875 mg total) by mouth 2 (two) times daily for 10 days.    Dispense:  20 tablet    Refill:  0    Order Specific Question:   Supervising Provider    Answer:   Corey Harold   chlorpheniramine-HYDROcodone (TUSSIONEX PENNKINETIC ER) 10-8 MG/5ML SUER    Sig: Take 5 mLs by mouth every 12 (twelve) hours as needed for cough.    Dispense:  115 mL    Refill:  0    Order Specific  Question:   Supervising Provider    Answer:   Corey Harold   benzonatate (TESSALON) 100 MG capsule    Sig: Take 1 capsule (100 mg total) by mouth 2 (two) times daily as needed for cough.    Dispense:  30 capsule    Refill:  2    Order Specific Question:   Supervising Provider    AnswerCorey Harold    Orders Placed This Encounter  Procedures   POC COVID-19 BinaxNow      Follow-up: Return if symptoms worsen or fail to improve.  An After Visit Summary was printed and given to the patient.  Jettie Pagan [facility] [phone]

## 2021-02-21 ENCOUNTER — Ambulatory Visit
Admission: RE | Admit: 2021-02-21 | Discharge: 2021-02-21 | Disposition: A | Discharge status: Home / Self Care | Payer: BC Managed Care – PPO | Source: Ambulatory Visit | Attending: Family Medicine | Admitting: Family Medicine

## 2021-02-21 ENCOUNTER — Other Ambulatory Visit: Payer: Self-pay

## 2021-02-21 DIAGNOSIS — Z1231 Encounter for screening mammogram for malignant neoplasm of breast: Secondary | ICD-10-CM

## 2021-03-18 ENCOUNTER — Ambulatory Visit: Payer: BC Managed Care – PPO

## 2021-03-18 DIAGNOSIS — Z23 Encounter for immunization: Secondary | ICD-10-CM

## 2021-03-18 NOTE — Progress Notes (Signed)
Covid-19 Vaccination Clinic  Name:  Kathleen Russo    MRN: [id number] DOB: [birth year]  03/18/2021  [name] was observed post Covid-19 immunization for 15 minutes without incident. She was provided with Vaccine Information Sheet and instruction to access the V-Safe system.   [name] was instructed to call [phone] with any severe reactions post vaccine: Difficulty breathing  Swelling of face and throat  A fast heartbeat  A bad rash all over body  Dizziness and weakness

## 2021-03-27 ENCOUNTER — Other Ambulatory Visit: Payer: Self-pay

## 2021-03-27 MED ORDER — LISINOPRIL-HYDROCHLOROTHIAZIDE 10-12.5 MG PO TABS: ORAL_TABLET | ORAL | 1 refills | Status: DC

## 2021-03-28 ENCOUNTER — Other Ambulatory Visit: Payer: Self-pay | Admitting: Physician Assistant

## 2021-03-28 DIAGNOSIS — J060 Acute laryngopharyngitis: Secondary | ICD-10-CM

## 2021-04-21 ENCOUNTER — Encounter: Payer: Self-pay | Admitting: Family Medicine

## 2021-04-22 ENCOUNTER — Ambulatory Visit (INDEPENDENT_AMBULATORY_CARE_PROVIDER_SITE_OTHER): Payer: BC Managed Care – PPO

## 2021-04-22 DIAGNOSIS — Z23 Encounter for immunization: Secondary | ICD-10-CM

## 2021-04-28 NOTE — Progress Notes (Signed)
Subjective:  Patient ID: Kathleen Russo, female    DOB: 02-23-1961  Age: 60 y.o. MRN: [id number]  Chief Complaint  Patient presents with   Diabetes   Hyperlipidemia   Hypothyroidism   HPI: Diabetes:  Complications: hyperlipideimia Glucose checking: qd Glucose logs: 98-140 Hypoglycemia: no Most recent A1C: 01/24/2021 Last results: 6.9 Current medications: Ozempic 1mg  once weekly injection,  Last Eye Exam: overdue Foot checks: daily  Hyperlipidemia: at goal on last 2 lipid panels. Current medications: Rosuvastatin 10mg  1 tablet once daily.  Hypertension: Current medications: Lisinopril/HCTZ- 10-12.5mg  1 tablet daily,   Hypothyroid:  Current Medications: Levothyroxine 1 tablet daily.  Vitamin D: 50000 U  twice weekly. B12 deficiency: pt has not been on her b12.  RA: on MTX and folate. Just had cbc, cmp, hepatitis panel all normal.   Diet: fairly healthy. Exercise: low-medium activity   Current Outpatient Medications on File Prior to Visit  Medication Sig Dispense Refill   Ascorbic Acid (VITAMIN C) 1000 MG tablet Take 1,000 mg by mouth daily.     benzonatate (TESSALON) 100 MG capsule Take 1 capsule (100 mg total) by mouth 2 (two) times daily as needed for cough. 30 capsule 2   Bioflavonoid Products (QUERCETIN COMPLEX IMMUNE PO) Take by mouth.     blood glucose meter kit and supplies KIT Dispense based on patient and insurance preference. Check daily in am. E11.69. 1 each 0   chlorpheniramine-HYDROcodone (TUSSIONEX) 10-8 MG/5ML SUER TAKE 1 TEASPOONFUL (5 ML) EVERY 12 HOURS AS NEEDED FOR COUGH 70 mL 0   clobetasol ointment (TEMOVATE) 0.05 % Apply 1 application topically 2 (two) times daily. 60 g 1   cyanocobalamin (,VITAMIN B-12,) 1000 MCG/ML injection INJECT INTO THE MUSCLE ONCE A WEEK 4 mL 0   cyclobenzaprine (FLEXERIL) 5 MG tablet Take 1 tablet (5 mg total) by mouth 3 (three) times daily as needed. 90 tablet 1   fluticasone (FLONASE) 50 MCG/ACT nasal spray  Place 2 sprays into both nostrils daily. 16 g 2   folic acid (FOLVITE) 1 MG tablet Take 1 tablet (1 mg total) by mouth 2 (two) times daily. 180 tablet 3   levothyroxine (SYNTHROID) 137 MCG tablet TAKE ONE (1) TABLET BY MOUTH ONCE DAILY 90 tablet 2   lisinopril-hydrochlorothiazide (ZESTORETIC) 10-12.5 MG tablet TAKE ONE (1) TABLET BY MOUTH ONCE DAILY 90 tablet 1   methotrexate 250 MG/10ML injection SMARTSIG:0.7 Milliliter(s) SUB-Q Once a Week     montelukast (SINGULAIR) 10 MG tablet Take 1 tablet (10 mg total) by mouth at bedtime. 90 tablet 3   ondansetron (ZOFRAN) 4 MG tablet Take 4 mg by mouth daily as needed.     ONETOUCH ULTRA test strip USE TO CHECK BLOOD SUGAR ONCE DAILY IN THE MORNING 50 each 1   polyethylene glycol (MIRALAX / GLYCOLAX) 17 g packet Take 17 g by mouth daily.     rosuvastatin (CRESTOR) 10 MG tablet TAKE ONE (1) TABLET BY MOUTH ONCE DAILY 90 tablet 0   Semaglutide, 1 MG/DOSE, (OZEMPIC, 1 MG/DOSE,) 4 MG/3ML SOPN Inject 1 mg into the skin once a week. 9 mL 1   Vitamin D, Ergocalciferol, (DRISDOL) 1.25 MG (50000 UNIT) CAPS capsule Take 1 capsule (50,000 Units total) by mouth 2 (two) times a week. 24 capsule 0   zinc gluconate 50 MG tablet Take 50 mg by mouth daily.     No current facility-administered medications on file prior to visit.   Past Medical History:  Diagnosis Date   Anemia  Arthritis    Cancer (HCC)    UTERINE   Diabetes mellitus    DM2, not requiring meds as of 04/2011   Dizziness    GERD (gastroesophageal reflux disease)    Hemorrhoids    Hyperlipidemia    Hypertension    not requiring meds as of 04/2011   Hypothyroidism    Lichen sclerosus    PONV (postoperative nausea and vomiting)    Primary osteoarthritis    Sleep apnea    USES CPAP   Swelling of both ankles    Type 2 diabetes mellitus (HCC)    Past Surgical History:  Procedure Laterality Date   ABDOMINAL HYSTERECTOMY     BARIATRIC SURGERY     BREAST LUMPECTOMY Left    BREAST SURGERY   1979   ELBOW SURGERY  1990'S   GASTRIC BYPASS  2007   JOINT REPLACEMENT  2002   RT. KNEE   KNEE ARTHROSCOPY  1993   LT. KNEE   KNEE ARTHROSCOPY W/ LATERAL RELEASE  1987   RT. KNEE   left hip replacement  05/2017   TONSILLECTOMY     TOTAL KNEE ARTHROPLASTY  07/06/2011   Procedure: TOTAL KNEE ARTHROPLASTY;  Surgeon: Raymon Mutton, MD;  Location: [facility] OR;  Service: Orthopedics;  Laterality: Left;    Family History  Problem Relation Age of Onset   Hypertension Mother    Thyroid disease Mother    Diabetes Mother    Other Father        Wegoners disease   Thyroid disease Sister    Graves' disease Brother    Social History   Socioeconomic History   Marital status: Married    Spouse name: Not on file   Number of children: Not on file   Years of education: Not on file   Highest education level: Not on file  Occupational History   Occupation: retired  Tobacco Use   Smoking status: Never   Smokeless tobacco: Never  Vaping Use   Vaping Use: Never used  Substance and Sexual Activity   Alcohol use: No   Drug use: No   Sexual activity: Yes    Partners: Male    Birth control/protection: Surgical    Comment: hysterectomy  Other Topics Concern   Not on file  Social History Narrative   Not on file   Social Determinants of Health   Financial Resource Strain: Not on file  Food Insecurity: Not on file  Transportation Needs: Not on file  Physical Activity: Not on file  Stress: Not on file  Social Connections: Not on file    Review of Systems  Constitutional:  Negative for appetite change, fatigue and fever.  HENT:  Negative for congestion, ear pain, sinus pressure and sore throat.   Eyes:  Negative for pain.  Respiratory:  Negative for cough, chest tightness, shortness of breath and wheezing.   Cardiovascular:  Negative for chest pain and palpitations.  Gastrointestinal:  Negative for abdominal pain, constipation, diarrhea, nausea and vomiting.  Genitourinary:  Negative for  dysuria and hematuria.  Musculoskeletal:  Positive for arthralgias (knees and hips). Negative for back pain, joint swelling and myalgias.  Skin:  Negative for rash.  Neurological:  Positive for headaches. Negative for dizziness and weakness.  Psychiatric/Behavioral:  Negative for dysphoric mood. The patient is not nervous/anxious.     Objective:  BP 110/62   Pulse 72   Temp (!) 96.3 F (35.7 C)   Resp 16   Ht 5\' 6"  (1.676  m)   Wt 256 lb (116.1 kg)   BMI 41.32 kg/m   BP/Weight 04/29/2021 02/12/2021 01/24/2021  Systolic BP 110 126 124  Diastolic BP 62 70 64  Wt. (Lbs) 256 252.2 255.6  BMI 41.32 40.71 41.25    Physical Exam Vitals reviewed.  Constitutional:      Appearance: Normal appearance. She is obese.  Neck:     Vascular: No carotid bruit.  Cardiovascular:     Rate and Rhythm: Normal rate and regular rhythm.     Pulses: Normal pulses.     Heart sounds: Normal heart sounds.  Pulmonary:     Effort: Pulmonary effort is normal. No respiratory distress.     Breath sounds: Normal breath sounds.  Abdominal:     General: Abdomen is flat. Bowel sounds are normal.     Palpations: Abdomen is soft.     Tenderness: There is no abdominal tenderness.  Neurological:     Mental Status: She is alert and oriented to person, place, and time.  Psychiatric:        Mood and Affect: Mood normal.        Behavior: Behavior normal.    Diabetic Foot Exam - Simple   Simple Foot Form Diabetic Foot exam was performed with the following findings: Yes 04/29/2021  8:09 AM  Visual Inspection See comments: Yes Sensation Testing Intact to touch and monofilament testing bilaterally: Yes Pulse Check Posterior Tibialis and Dorsalis pulse intact bilaterally: Yes Comments Pes planus. Rt foot inversion      Lab Results  Component Value Date   WBC 10.5 01/24/2021   HGB 12.2 01/24/2021   HCT 37.8 01/24/2021   PLT 373 01/24/2021   GLUCOSE 132 (H) 01/24/2021   CHOL 128 01/24/2021   TRIG 141  01/24/2021   HDL 40 01/24/2021   LDLCALC 63 01/24/2021   ALT 15 01/24/2021   AST 9 01/24/2021   NA 141 01/24/2021   K 4.5 01/24/2021   CL 101 01/24/2021   CREATININE 0.63 01/24/2021   BUN 13 01/24/2021   CO2 25 01/24/2021   TSH 4.100 07/24/2020   INR 0.92 06/25/2011   HGBA1C 6.9 (H) 01/24/2021   MICROALBUR 30 07/24/2020      Assessment & Plan:   Problem List Items Addressed This Visit       Cardiovascular and Mediastinum   Essential hypertension    The current medical regimen is effective;  continue present plan and medications. Continue  Lisinopril/HCTZ- 10-12.5mg  1 tablet daily,         Endocrine   Hypothyroidism    Check tsh      Relevant Orders   TSH   Type 2 diabetes mellitus with hyperglycemia, without long-term current use of insulin (HCC)    Control: fair Recommend check sugars fasting daily. Recommend check feet daily. Recommend annual eye exams. Medicines: ozempic 1 mg weekly. May need to increase dose.  Continue to work on eating a healthy diet and exercise.  Labs drawn today.         Relevant Orders   Hemoglobin A1c     Other   Mixed hyperlipidemia    The current medical regimen is effective;  continue present plan and medications. Continue crestor 10 mg once daily at night Recommend continue to work on eating healthy diet and exercise.       Morbid obesity (HCC)    Recommend continue to work on eating healthy diet and exercise. Consider increase in ozempic to 2 mg weekly. Will  need to appeal insurance company      B12 deficiency    Check b12, mma.      Relevant Orders   Vitamin B12   Methylmalonic acid, serum   Vitamin D deficiency    Check vitamin D      Relevant Orders   Vitamin B12   VITAMIN D 25 Hydroxy (Vit-D Deficiency, Fractures)   BMI 40.0-44.9, adult (HCC)   Other Visit Diagnoses     Need for pneumococcal vaccine    -  Primary   Relevant Orders   Pneumococcal conjugate vaccine 20-valent (Prevnar 20) (Completed)      .  No orders of the defined types were placed in this encounter.   Orders Placed This Encounter  Procedures   Pneumococcal conjugate vaccine 20-valent (Prevnar 20)   Hemoglobin A1c   Vitamin B12   VITAMIN D 25 Hydroxy (Vit-D Deficiency, Fractures)   Methylmalonic acid, serum   TSH     Follow-up: Return in about 3 months (around 07/30/2021) for chronic fasting.  An After Visit Summary was printed and given to the patient.   I,Lauren M Auman,acting as a scribe for Blane Ohara, MD.,have documented all relevant documentation on the behalf of Blane Ohara, MD,as directed by  Blane Ohara, MD while in the presence of Blane Ohara, MD.    Blane Ohara, MD [facility] [phone]

## 2021-04-29 ENCOUNTER — Encounter: Payer: Self-pay | Admitting: Family Medicine

## 2021-04-29 ENCOUNTER — Ambulatory Visit: Payer: BC Managed Care – PPO | Admitting: Family Medicine

## 2021-04-29 ENCOUNTER — Other Ambulatory Visit: Payer: Self-pay

## 2021-04-29 VITALS — BP 110/62 | HR 72 | Temp 96.3°F | Resp 16 | Ht 66.0 in | Wt 256.0 lb

## 2021-04-29 DIAGNOSIS — E1165 Type 2 diabetes mellitus with hyperglycemia: Secondary | ICD-10-CM

## 2021-04-29 DIAGNOSIS — G9332 Myalgic encephalomyelitis/chronic fatigue syndrome: Secondary | ICD-10-CM | POA: Insufficient documentation

## 2021-04-29 DIAGNOSIS — E559 Vitamin D deficiency, unspecified: Secondary | ICD-10-CM

## 2021-04-29 DIAGNOSIS — M069 Rheumatoid arthritis, unspecified: Secondary | ICD-10-CM | POA: Insufficient documentation

## 2021-04-29 DIAGNOSIS — Z23 Encounter for immunization: Secondary | ICD-10-CM

## 2021-04-29 DIAGNOSIS — Z6841 Body Mass Index (BMI) 40.0 and over, adult: Secondary | ICD-10-CM

## 2021-04-29 DIAGNOSIS — I10 Essential (primary) hypertension: Secondary | ICD-10-CM | POA: Diagnosis not present

## 2021-04-29 DIAGNOSIS — M1990 Unspecified osteoarthritis, unspecified site: Secondary | ICD-10-CM | POA: Insufficient documentation

## 2021-04-29 DIAGNOSIS — E538 Deficiency of other specified B group vitamins: Secondary | ICD-10-CM

## 2021-04-29 DIAGNOSIS — E782 Mixed hyperlipidemia: Secondary | ICD-10-CM

## 2021-04-29 DIAGNOSIS — E039 Hypothyroidism, unspecified: Secondary | ICD-10-CM

## 2021-04-29 NOTE — Assessment & Plan Note (Signed)
Check tsh 

## 2021-04-29 NOTE — Assessment & Plan Note (Signed)
Control: fair Recommend check sugars fasting daily. Recommend check feet daily. Recommend annual eye exams. Medicines: ozempic 1 mg weekly. May need to increase dose.  Continue to work on eating a healthy diet and exercise.  Labs drawn today.

## 2021-04-29 NOTE — Assessment & Plan Note (Signed)
Check b12,mma 

## 2021-04-29 NOTE — Assessment & Plan Note (Signed)
The current medical regimen is effective;  continue present plan and medications. Continue  Lisinopril/HCTZ- 10-12.5mg  1 tablet daily,

## 2021-04-29 NOTE — Assessment & Plan Note (Signed)
Check vitamin D. 

## 2021-04-29 NOTE — Assessment & Plan Note (Signed)
The current medical regimen is effective;  continue present plan and medications. Continue crestor 10 mg once daily at night Recommend continue to work on eating healthy diet and exercise.

## 2021-04-29 NOTE — Assessment & Plan Note (Signed)
Recommend continue to work on eating healthy diet and exercise. Consider increase in ozempic to 2 mg weekly. Will need to appeal insurance company

## 2021-05-02 ENCOUNTER — Encounter: Payer: Self-pay | Admitting: Family Medicine

## 2021-05-02 LAB — TSH: TSH: 0.331 u[IU]/mL — ABNORMAL LOW (ref 0.450–4.500)

## 2021-05-02 LAB — METHYLMALONIC ACID, SERUM: Methylmalonic Acid: 130 nmol/L (ref 0–378)

## 2021-05-02 LAB — VITAMIN B12: Vitamin B-12: 259 pg/mL (ref 232–1245)

## 2021-05-02 LAB — HEMOGLOBIN A1C
Est. average glucose Bld gHb Est-mCnc: 151 mg/dL
Hgb A1c MFr Bld: 6.9 % — ABNORMAL HIGH (ref 4.8–5.6)

## 2021-05-02 LAB — VITAMIN D 25 HYDROXY (VIT D DEFICIENCY, FRACTURES): Vit D, 25-Hydroxy: 64.6 ng/mL (ref 30.0–100.0)

## 2021-05-04 NOTE — Progress Notes (Signed)
Blood count normal.  Liver function normal.  Kidney function normal.  Thyroid function abnormal. Decrease synthroid to 125 mcg once daily in am. Recheck tsh in 3 months with other labs.  HBA1C: 6.9. stable. Vitamin D great. Decrease vitamin D to once weekly.  B12 low normal. Recommend b12 shots monthly. Send refill if needed.

## 2021-05-05 ENCOUNTER — Other Ambulatory Visit: Payer: Self-pay | Admitting: Nurse Practitioner

## 2021-05-05 DIAGNOSIS — E1165 Type 2 diabetes mellitus with hyperglycemia: Secondary | ICD-10-CM

## 2021-05-05 MED ORDER — VITAMIN D (ERGOCALCIFEROL) 1.25 MG (50000 UNIT) PO CAPS: 50000.0000 [IU] | ORAL_CAPSULE | ORAL | 0 refills | Status: DC

## 2021-05-05 MED ORDER — CYANOCOBALAMIN 1000 MCG/ML IJ SOLN: 1000.0000 ug | INTRAMUSCULAR | 0 refills | Status: DC

## 2021-05-05 MED ORDER — LEVOTHYROXINE SODIUM 125 MCG PO TABS: 125.0000 ug | ORAL_TABLET | Freq: Every day | ORAL | 1 refills | Status: DC

## 2021-05-07 ENCOUNTER — Encounter: Payer: Self-pay | Admitting: Family Medicine

## 2021-06-06 ENCOUNTER — Other Ambulatory Visit: Payer: Self-pay

## 2021-06-06 DIAGNOSIS — E782 Mixed hyperlipidemia: Secondary | ICD-10-CM

## 2021-06-06 MED ORDER — ETODOLAC 400 MG PO TABS: 400.0000 mg | ORAL_TABLET | Freq: Two times a day (BID) | ORAL | 0 refills | Status: DC

## 2021-06-06 MED ORDER — ROSUVASTATIN CALCIUM 10 MG PO TABS: ORAL_TABLET | ORAL | 0 refills | Status: DC

## 2021-06-25 ENCOUNTER — Other Ambulatory Visit: Payer: Self-pay

## 2021-06-25 MED ORDER — LISINOPRIL-HYDROCHLOROTHIAZIDE 10-12.5 MG PO TABS: ORAL_TABLET | ORAL | 1 refills | Status: DC

## 2021-06-25 NOTE — Telephone Encounter (Signed)
Needed medication sent to different pharmacy due to previous pharmacy closing.   Royce Macadamia, [place] 06/25/21 1:54 PM

## 2021-06-26 ENCOUNTER — Other Ambulatory Visit: Payer: Self-pay

## 2021-07-26 ENCOUNTER — Other Ambulatory Visit: Payer: Self-pay | Admitting: Family Medicine

## 2021-07-28 ENCOUNTER — Encounter: Payer: Self-pay | Admitting: Nurse Practitioner

## 2021-07-28 ENCOUNTER — Ambulatory Visit: Payer: BC Managed Care – PPO | Admitting: Nurse Practitioner

## 2021-07-28 VITALS — BP 126/74 | HR 69 | Temp 97.1°F | Ht 66.0 in | Wt 257.0 lb

## 2021-07-28 DIAGNOSIS — R051 Acute cough: Secondary | ICD-10-CM

## 2021-07-28 DIAGNOSIS — J018 Other acute sinusitis: Secondary | ICD-10-CM

## 2021-07-28 MED ORDER — MONTELUKAST SODIUM 10 MG PO TABS: 10.0000 mg | ORAL_TABLET | Freq: Every day | ORAL | 3 refills | Status: DC

## 2021-07-28 MED ORDER — AZITHROMYCIN 250 MG PO TABS: ORAL_TABLET | ORAL | 0 refills | Status: AC

## 2021-07-28 MED ORDER — HYDROCOD POLI-CHLORPHE POLI ER 10-8 MG/5ML PO SUER: 5.0000 mL | Freq: Two times a day (BID) | ORAL | 0 refills | Status: DC | PRN

## 2021-07-28 MED ORDER — FLUTICASONE PROPIONATE 50 MCG/ACT NA SUSP: 2.0000 | Freq: Every day | NASAL | 6 refills | Status: DC

## 2021-07-28 NOTE — Progress Notes (Signed)
Acute Office Visit  Subjective:    Patient ID: Kathleen Russo, female    DOB: 20-Sep-1960, 61 y.o.   MRN: [id number]  Chief Complaint  Patient presents with   URI    HPI: Patient is in today for Upper respiratory symptoms She complains of sinus pressure/pain, rhinorrhea, post-nasal-drip, cough, bilateral ear fullness, and sore throat . Onset of symptoms was  5 weeks ago with intermittently improving and then relapsing.Treatment has included Sudafed, Flonase, Singulair, and Tessalon Perles and pushing fluids.  Past history is significant for occasional episodes of bronchitis and chronic allergic rhinitis. Patient is non-smoker. She has obtained seasonal flu vaccine,  COVID-19 vaccines, and booster.   Past Medical History:  Diagnosis Date   Anemia    Arthritis    Cancer (HCC)    UTERINE   Diabetes mellitus    DM2, not requiring meds as of 04/2011   Dizziness    GERD (gastroesophageal reflux disease)    Hemorrhoids    Hyperlipidemia    Hypertension    not requiring meds as of 04/2011   Hypothyroidism    Lichen sclerosus    PONV (postoperative nausea and vomiting)    Primary osteoarthritis    Sleep apnea    USES CPAP   Swelling of both ankles    Type 2 diabetes mellitus (HCC)     Past Surgical History:  Procedure Laterality Date   ABDOMINAL HYSTERECTOMY     BARIATRIC SURGERY     BREAST LUMPECTOMY Left    BREAST SURGERY  1979   ELBOW SURGERY  1990'S   GASTRIC BYPASS  2007   JOINT REPLACEMENT  2002   RT. KNEE   KNEE ARTHROSCOPY  1993   LT. KNEE   KNEE ARTHROSCOPY W/ LATERAL RELEASE  1987   RT. KNEE   left hip replacement  05/2017   TONSILLECTOMY     TOTAL KNEE ARTHROPLASTY  07/06/2011   Procedure: TOTAL KNEE ARTHROPLASTY;  Surgeon: Raymon Mutton, MD;  Location: [facility] OR;  Service: Orthopedics;  Laterality: Left;    Family History  Problem Relation Age of Onset   Hypertension Mother    Thyroid disease Mother    Diabetes Mother    Other Father         Wegoners disease   Thyroid disease Sister    Graves' disease Brother     Social History   Socioeconomic History   Marital status: Married    Spouse name: Not on file   Number of children: Not on file   Years of education: Not on file   Highest education level: Not on file  Occupational History   Occupation: retired  Tobacco Use   Smoking status: Never   Smokeless tobacco: Never  Vaping Use   Vaping Use: Never used  Substance and Sexual Activity   Alcohol use: No   Drug use: No   Sexual activity: Yes    Partners: Male    Birth control/protection: Surgical    Comment: hysterectomy  Other Topics Concern   Not on file  Social History Narrative   Not on file   Social Determinants of Health   Financial Resource Strain: Not on file  Food Insecurity: Not on file  Transportation Needs: Not on file  Physical Activity: Not on file  Stress: Not on file  Social Connections: Not on file  Intimate Partner Violence: Not on file    Outpatient Medications Prior to Visit  Medication Sig Dispense Refill   OZEMPIC, 1  MG/DOSE, 4 MG/3ML SOPN INJECT 1 MG UNDER THE SKIN ONCE PER WEEK 9 mL 1   Ascorbic Acid (VITAMIN C) 1000 MG tablet Take 1,000 mg by mouth daily.     benzonatate (TESSALON) 100 MG capsule Take 1 capsule (100 mg total) by mouth 2 (two) times daily as needed for cough. 30 capsule 2   Bioflavonoid Products (QUERCETIN COMPLEX IMMUNE PO) Take by mouth.     blood glucose meter kit and supplies KIT Dispense based on patient and insurance preference. Check daily in am. E11.69. 1 each 0   chlorpheniramine-HYDROcodone (TUSSIONEX) 10-8 MG/5ML SUER TAKE 1 TEASPOONFUL (5 ML) EVERY 12 HOURS AS NEEDED FOR COUGH 70 mL 0   clobetasol ointment (TEMOVATE) 0.05 % Apply 1 application topically 2 (two) times daily. 60 g 1   cyanocobalamin (,VITAMIN B-12,) 1000 MCG/ML injection Inject 1 mL (1,000 mcg total) into the muscle every 30 (thirty) days. 3 mL 0   cyclobenzaprine (FLEXERIL) 5 MG tablet  Take 1 tablet (5 mg total) by mouth 3 (three) times daily as needed. 90 tablet 1   etodolac (LODINE) 400 MG tablet Take 1 tablet (400 mg total) by mouth 2 (two) times daily. 180 tablet 0   fluticasone (FLONASE) 50 MCG/ACT nasal spray Place 2 sprays into both nostrils daily. 16 g 2   folic acid (FOLVITE) 1 MG tablet Take 1 tablet (1 mg total) by mouth 2 (two) times daily. 180 tablet 3   levothyroxine (SYNTHROID) 125 MCG tablet Take 1 tablet (125 mcg total) by mouth daily before breakfast. TAKE ONE (1) TABLET BY MOUTH ONCE DAILY 90 tablet 1   lisinopril-hydrochlorothiazide (ZESTORETIC) 10-12.5 MG tablet TAKE ONE (1) TABLET BY MOUTH ONCE DAILY 90 tablet 1   methotrexate 250 MG/10ML injection SMARTSIG:0.7 Milliliter(s) SUB-Q Once a Week     montelukast (SINGULAIR) 10 MG tablet Take 1 tablet (10 mg total) by mouth at bedtime. 90 tablet 3   ondansetron (ZOFRAN) 4 MG tablet Take 4 mg by mouth daily as needed.     ONETOUCH ULTRA test strip USE TO CHECK BLOOD SUGAR ONCE DAILY IN THE MORNING 50 each 1   polyethylene glycol (MIRALAX / GLYCOLAX) 17 g packet Take 17 g by mouth daily.     rosuvastatin (CRESTOR) 10 MG tablet TAKE ONE (1) TABLET BY MOUTH ONCE DAILY 90 tablet 0   Vitamin D, Ergocalciferol, (DRISDOL) 1.25 MG (50000 UNIT) CAPS capsule TAKE 1 CAPSULE BY MOUTH EVERY 7 DAYS 12 capsule 0   zinc gluconate 50 MG tablet Take 50 mg by mouth daily.     No facility-administered medications prior to visit.    Allergies  Allergen Reactions   Quinolones     Medications don't absorb   Tolmetin Other (See Comments)    THIS MEDICATION WILL NOT ABSORB D/T GBP Medications don't absorb     Review of Systems  Constitutional:  Negative for chills, fatigue and fever.  HENT:  Positive for congestion, ear pain (popping and fullness bilaterally), postnasal drip, rhinorrhea, sinus pressure, sinus pain and sore throat.   Respiratory:  Positive for cough. Negative for shortness of breath.   Cardiovascular:   Negative for chest pain.  Gastrointestinal:  Negative for diarrhea and nausea.  Musculoskeletal:  Negative for myalgias.  Skin: Negative.   Allergic/Immunologic: Positive for environmental allergies and immunocompromised state.  Neurological:  Positive for light-headedness. Negative for dizziness and headaches.      Objective:    Physical Exam Vitals reviewed.  Constitutional:  Appearance: Normal appearance.  HENT:     Head: Normocephalic.     Right Ear: Tympanic membrane normal.     Left Ear: Tympanic membrane normal.     Nose: Congestion and rhinorrhea present.     Right Sinus: Frontal sinus tenderness present.     Left Sinus: Frontal sinus tenderness present.     Mouth/Throat:     Mouth: Mucous membranes are moist.     Pharynx: Posterior oropharyngeal erythema present.  Eyes:     Pupils: Pupils are equal, round, and reactive to light.  Cardiovascular:     Rate and Rhythm: Normal rate and regular rhythm.     Pulses: Normal pulses.     Heart sounds: Normal heart sounds.  Pulmonary:     Effort: Pulmonary effort is normal.     Breath sounds: Normal breath sounds.  Abdominal:     General: Bowel sounds are normal.     Palpations: Abdomen is soft.  Musculoskeletal:        General: Normal range of motion.     Cervical back: Neck supple.  Skin:    General: Skin is warm and dry.     Capillary Refill: Capillary refill takes less than 2 seconds.  Neurological:     General: No focal deficit present.     Mental Status: She is alert and oriented to person, place, and time.  Psychiatric:        Mood and Affect: Mood normal.        Behavior: Behavior normal.    Pulse 69    Temp (!) 97.1 F (36.2 C)    Ht 5\' 6"  (1.676 m)    Wt 257 lb (116.6 kg)    SpO2 99%    BMI 41.48 kg/m  BP 126/74    Pulse 69    Temp (!) 97.1 F (36.2 C)    Ht 5\' 6"  (1.676 m)    Wt 257 lb (116.6 kg)    SpO2 99%    BMI 41.48 kg/m   Wt Readings from Last 3 Encounters:  07/28/21 257 lb (116.6 kg)   04/29/21 256 lb (116.1 kg)  02/12/21 252 lb 3.2 oz (114.4 kg)    Health Maintenance Due  Topic Date Due   OPHTHALMOLOGY EXAM  Never done   HIV Screening  Never done   Zoster Vaccines- Shingrix (1 of 2) Never done   PAP SMEAR-Modifier  03/29/2021   COVID-19 Vaccine (4 - Booster) 05/13/2021       Lab Results  Component Value Date   TSH 0.331 (L) 04/29/2021   Lab Results  Component Value Date   WBC 10.5 01/24/2021   HGB 12.2 01/24/2021   HCT 37.8 01/24/2021   MCV 83 01/24/2021   PLT 373 01/24/2021   Lab Results  Component Value Date   NA 141 01/24/2021   K 4.5 01/24/2021   CO2 25 01/24/2021   GLUCOSE 132 (H) 01/24/2021   BUN 13 01/24/2021   CREATININE 0.63 01/24/2021   BILITOT 0.5 01/24/2021   ALKPHOS 123 (H) 01/24/2021   AST 9 01/24/2021   ALT 15 01/24/2021   PROT 6.7 01/24/2021   ALBUMIN 4.2 01/24/2021   CALCIUM 9.6 01/24/2021   EGFR 101 01/24/2021   Lab Results  Component Value Date   CHOL 128 01/24/2021   Lab Results  Component Value Date   HDL 40 01/24/2021   Lab Results  Component Value Date   LDLCALC 63 01/24/2021   Lab Results  Component  Value Date   TRIG 141 01/24/2021   Lab Results  Component Value Date   CHOLHDL 3.2 01/24/2021   Lab Results  Component Value Date   HGBA1C 6.9 (H) 04/29/2021       Assessment & Plan:   1. Acute non-recurrent sinusitis of other sinus - montelukast (SINGULAIR) 10 MG tablet; Take 1 tablet (10 mg total) by mouth at bedtime.  Dispense: 90 tablet; Refill: 3 - azithromycin (ZITHROMAX) 250 MG tablet; Take 2 tablets on day 1, then 1 tablet daily on days 2 through 5  Dispense: 6 tablet; Refill: 0 - fluticasone (FLONASE) 50 MCG/ACT nasal spray; Place 2 sprays into both nostrils daily.  Dispense: 16 g; Refill: 6  2. Acute cough - chlorpheniramine-HYDROcodone (TUSSIONEX PENNKINETIC ER) 10-8 MG/5ML; Take 5 mLs by mouth every 12 (twelve) hours as needed for cough.  Dispense: 115 mL; Refill: 0    Take Z-pack as  directed Use Flonase, Singulair daily Take cough syrup as needed Rest and push fluids    Follow-up: PRN  An After Visit Summary was printed and given to the patient.  I, Janie Morning, NP, have reviewed all documentation for this visit. The documentation on 07/28/21 for the exam, diagnosis, procedures, and orders are all accurate and complete.    Signed, Janie Morning, NP [facility] [phone]

## 2021-07-28 NOTE — Patient Instructions (Signed)
Take Z-pack as directed Use Flonase, Singulair daily Take cough syrup as needed Rest and push fluids  Sinusitis, Adult Sinusitis is soreness and swelling (inflammation) of your sinuses. Sinuses are hollow spaces in the bones around your face. They are located: Around your eyes. In the middle of your forehead. Behind your nose. In your cheekbones. Your sinuses and nasal passages are lined with a fluid called mucus. Mucus drains out of your sinuses. Swelling can trap mucus in your sinuses. This lets germs (bacteria, virus, or fungus) grow, which leads to infection. Most of the time, this condition is caused by a virus. What are the causes? This condition is caused by: Allergies. Asthma. Germs. Things that block your nose or sinuses. Growths in the nose (nasal polyps). Chemicals or irritants in the air. Fungus (rare). What increases the risk? You are more likely to develop this condition if: You have a weak body defense system (immune system). You do a lot of swimming or diving. You use nasal sprays too much. You smoke. What are the signs or symptoms? The main symptoms of this condition are pain and a feeling of pressure around the sinuses. Other symptoms include: Stuffy nose (congestion). Runny nose (drainage). Swelling and warmth in the sinuses. Headache. Toothache. A cough that may get worse at night. Mucus that collects in the throat or the back of the nose (postnasal drip). Being unable to smell and taste. Being very tired (fatigue). A fever. Sore throat. Bad breath. How is this diagnosed? This condition is diagnosed based on: Your symptoms. Your medical history. A physical exam. Tests to find out if your condition is short-term (acute) or long-term (chronic). Your doctor may: Check your nose for growths (polyps). Check your sinuses using a tool that has a light (endoscope). Check for allergies or germs. Do imaging tests, such as an MRI or CT scan. How is this  treated? Treatment for this condition depends on the cause and whether it is short-term or long-term. If caused by a virus, your symptoms should go away on their own within 10 days. You may be given medicines to relieve symptoms. They include: Medicines that shrink swollen tissue in the nose. Medicines that treat allergies (antihistamines). A spray that treats swelling of the nostrils.  Rinses that help get rid of thick mucus in your nose (nasal saline washes). If caused by bacteria, your doctor may wait to see if you will get better without treatment. You may be given antibiotic medicine if you have: A very bad infection. A weak body defense system. If caused by growths in the nose, you may need to have surgery. Follow these instructions at home: Medicines Take, use, or apply over-the-counter and prescription medicines only as told by your doctor. These may include nasal sprays. If you were prescribed an antibiotic medicine, take it as told by your doctor. Do not stop taking the antibiotic even if you start to feel better. Hydrate and humidify  Drink enough water to keep your pee (urine) pale yellow. Use a cool mist humidifier to keep the humidity level in your home above 50%. Breathe in steam for 10-15 minutes, 3-4 times a day, or as told by your doctor. You can do this in the bathroom while a hot shower is running. Try not to spend time in cool or dry air. Rest Rest as much as you can. Sleep with your head raised (elevated). Make sure you get enough sleep each night. General instructions  Put a warm, moist washcloth on your  face 3-4 times a day, or as often as told by your doctor. This will help with discomfort. Wash your hands often with soap and water. If there is no soap and water, use hand sanitizer. Do not smoke. Avoid being around people who are smoking (secondhand smoke). Keep all follow-up visits as told by your doctor. This is important. Contact a doctor if: You have a  fever. Your symptoms get worse. Your symptoms do not get better within 10 days. Get help right away if: You have a very bad headache. You cannot stop throwing up (vomiting). You have very bad pain or swelling around your face or eyes. You have trouble seeing. You feel confused. Your neck is stiff. You have trouble breathing. Summary Sinusitis is swelling of your sinuses. Sinuses are hollow spaces in the bones around your face. This condition is caused by tissues in your nose that become inflamed or swollen. This traps germs. These can lead to infection. If you were prescribed an antibiotic medicine, take it as told by your doctor. Do not stop taking it even if you start to feel better. Keep all follow-up visits as told by your doctor. This is important. This information is not intended to replace advice given to you by your health care provider. Make sure you discuss any questions you have with your health care provider. Document Revised: 11/08/2017 Document Reviewed: 11/08/2017 Elsevier Patient Education  2022 [employer].

## 2021-07-31 ENCOUNTER — Ambulatory Visit: Payer: BC Managed Care – PPO | Admitting: Family Medicine

## 2021-08-01 ENCOUNTER — Telehealth: Payer: Self-pay | Admitting: Nurse Practitioner

## 2021-08-01 ENCOUNTER — Other Ambulatory Visit: Payer: Self-pay | Admitting: Nurse Practitioner

## 2021-08-01 DIAGNOSIS — J018 Other acute sinusitis: Secondary | ICD-10-CM

## 2021-08-01 DIAGNOSIS — R051 Acute cough: Secondary | ICD-10-CM

## 2021-08-01 MED ORDER — BENZONATATE 100 MG PO CAPS: 200.0000 mg | ORAL_CAPSULE | Freq: Three times a day (TID) | ORAL | 0 refills | Status: DC | PRN

## 2021-08-01 MED ORDER — AMOXICILLIN-POT CLAVULANATE 875-125 MG PO TABS: 1.0000 | ORAL_TABLET | Freq: Two times a day (BID) | ORAL | 0 refills | Status: DC

## 2021-08-01 NOTE — Telephone Encounter (Signed)
Pt called stating sinusitis and cough symptoms improved initially after taking Z-pack and Tussionex cough syrup. Symptoms have relapsed and she is experiencing right ear pain. Course of Augmentin and Tessalon Perles sent to pharmacy.

## 2021-08-02 ENCOUNTER — Other Ambulatory Visit: Payer: Self-pay | Admitting: Family Medicine

## 2021-08-11 NOTE — Progress Notes (Signed)
61 y.o. [id number] Married Caucasian female here for annual exam.    No vaginal bleeding, abnormal discharge or pelvic pain.  No concerns with bladder or bowel function.   No HRT.  Patient having irritation in groin area from sweating. She uses an absorbant powder to treat the area.  Wanting to have a treatment for this.   Hx lichen sclerosus.  No prior biopsy. Uses Temovate periodically.   States her A1C is under 7.   Retiring in August.  Works at [employer].   PCP:   Rochel Brome, MD  No LMP recorded. Patient has had a hysterectomy.           Sexually active: Yes.    The current method of family planning is status post hysterectomy.    Exercising: No.  The patient does not participate in regular exercise at present. Smoker:  no  Health Maintenance: Pap:  03-29-18 Neg:Neg HR HPV History of abnormal Pap:  history of uterine cancer early stage contained  MMG:  02-21-21 Neg/BiRads1 Colonoscopy:  2018;next 5 years BMD:  n/a  Result  n/a TDaP:  2019 Gardasil:   n/a HIV: no Hep C: no Screening Labs:  PCP Flu vaccine:  completed.  Covid booster x 2.    reports that she has never smoked. She has never used smokeless tobacco. She reports that she does not drink alcohol and does not use drugs.  Past Medical History:  Diagnosis Date   Anemia    Arthritis    Cancer ([place])    UTERINE   Diabetes mellitus    DM2, not requiring meds as of 04/2011   Dizziness    GERD (gastroesophageal reflux disease)    Hemorrhoids    Hyperlipidemia    Hypertension    not requiring meds as of 04/2011   Hypothyroidism    Lichen sclerosus    PONV (postoperative nausea and vomiting)    Primary osteoarthritis    Sleep apnea    USES CPAP   Swelling of both ankles    Type 2 diabetes mellitus ([place])     Past Surgical History:  Procedure Laterality Date   ABDOMINAL HYSTERECTOMY     BARIATRIC SURGERY     BREAST LUMPECTOMY Left    BREAST SURGERY  1979   ELBOW SURGERY  1990'S   GASTRIC BYPASS   2007   JOINT REPLACEMENT  2002   RT. KNEE   KNEE ARTHROSCOPY  1993   LT. KNEE   KNEE ARTHROSCOPY W/ LATERAL RELEASE  1987   RT. KNEE   left hip replacement  05/2017   TONSILLECTOMY     TOTAL KNEE ARTHROPLASTY  07/06/2011   Procedure: TOTAL KNEE ARTHROPLASTY;  Surgeon: Rudean Haskell, MD;  Location: [facility];  Service: Orthopedics;  Laterality: Left;    Current Outpatient Medications  Medication Sig Dispense Refill   OZEMPIC, 1 MG/DOSE, 4 MG/3ML SOPN INJECT 1 MG UNDER THE SKIN ONCE PER WEEK 9 mL 1   amoxicillin-clavulanate (AUGMENTIN) 875-125 MG tablet Take 1 tablet by mouth 2 (two) times daily. 20 tablet 0   Ascorbic Acid (VITAMIN C) 1000 MG tablet Take 1,000 mg by mouth daily.     benzonatate (TESSALON PERLES) 100 MG capsule Take 2 capsules (200 mg total) by mouth 3 (three) times daily as needed for cough. 30 capsule 0   Bioflavonoid Products (QUERCETIN COMPLEX IMMUNE PO) Take by mouth.     blood glucose meter kit and supplies KIT Dispense based on patient and insurance preference.  Check daily in am. E11.69. 1 each 0   chlorpheniramine-HYDROcodone (TUSSIONEX PENNKINETIC ER) 10-8 MG/5ML Take 5 mLs by mouth every 12 (twelve) hours as needed for cough. 115 mL 0   clobetasol ointment (TEMOVATE) 9.02 % Apply 1 application topically 2 (two) times daily. 60 g 1   cyanocobalamin (,VITAMIN B-12,) 1000 MCG/ML injection Inject 1 mL (1,000 mcg total) into the muscle every 30 (thirty) days. 3 mL 0   cyclobenzaprine (FLEXERIL) 5 MG tablet Take 1 tablet (5 mg total) by mouth 3 (three) times daily as needed. 90 tablet 1   etodolac (LODINE) 400 MG tablet Take 1 tablet (400 mg total) by mouth 2 (two) times daily. 180 tablet 0   fluticasone (FLONASE) 50 MCG/ACT nasal spray Place 2 sprays into both nostrils daily. 16 g 6   folic acid (FOLVITE) 1 MG tablet Take 1 tablet (1 mg total) by mouth 2 (two) times daily. 180 tablet 3   levothyroxine (SYNTHROID) 125 MCG tablet TAKE 1 TABLET(125 MCG) BY MOUTH EVERY DAY  DAILY BEFORE AND BREAKFAST 30 tablet 0   lisinopril-hydrochlorothiazide (ZESTORETIC) 10-12.5 MG tablet TAKE ONE (1) TABLET BY MOUTH ONCE DAILY 90 tablet 1   methotrexate 250 MG/10ML injection SMARTSIG:0.7 Milliliter(s) SUB-Q Once a Week     montelukast (SINGULAIR) 10 MG tablet Take 1 tablet (10 mg total) by mouth at bedtime. 90 tablet 3   ondansetron (ZOFRAN) 4 MG tablet Take 4 mg by mouth daily as needed.     ONETOUCH ULTRA test strip USE TO CHECK BLOOD SUGAR ONCE DAILY IN THE MORNING 50 each 1   polyethylene glycol (MIRALAX / GLYCOLAX) 17 g packet Take 17 g by mouth daily.     rosuvastatin (CRESTOR) 10 MG tablet TAKE ONE (1) TABLET BY MOUTH ONCE DAILY 90 tablet 0   Vitamin D, Ergocalciferol, (DRISDOL) 1.25 MG (50000 UNIT) CAPS capsule TAKE 1 CAPSULE BY MOUTH EVERY 7 DAYS 12 capsule 0   zinc gluconate 50 MG tablet Take 50 mg by mouth daily.     No current facility-administered medications for this visit.    Family History  Problem Relation Age of Onset   Hypertension Mother    Thyroid disease Mother    Diabetes Mother    Other Father        Wegoners disease   Thyroid disease Sister    Berenice Primas' disease Brother     Review of Systems  All other systems reviewed and are negative.  Exam:   BP 130/64    Pulse 82    Ht $R'5\' 5"'tn$  (1.651 m)    Wt 263 lb (119.3 kg)    SpO2 97%    BMI 43.77 kg/m     General appearance: alert, cooperative and appears stated age Head: normocephalic, without obvious abnormality, atraumatic Neck: no adenopathy, supple, symmetrical, trachea midline and thyroid normal to inspection and palpation Lungs: clear to auscultation bilaterally Breasts: normal appearance on right and visible and palpable scar on left upper outer breast, no masses or tenderness, No nipple retraction or dimpling, No nipple discharge or bleeding, No axillary adenopathy Heart: regular rate and rhythm Abdomen: soft, non-tender; no masses, no organomegaly Extremities: extremities normal,  atraumatic, no cyanosis or edema Skin: patchy erythema under the pannus and in inguinal regions.  Lymph nodes: cervical, supraclavicular, and axillary nodes normal. Neurologic: grossly normal  Pelvic: External genitalia:  periclitoral skin thickened and white.               No abnormal inguinal nodes palpated.  Urethra:  normal appearing urethra with no masses, tenderness or lesions              Bartholins and Skenes: normal                 Vagina: normal appearing vagina with normal color and discharge, no lesions              Cervix: absent              Pap taken: no Bimanual Exam:  Uterus:  absent              Adnexa: no mass, fullness, tenderness              Rectal exam: yes.  Confirms.              Anus:  normal sphincter tone, no lesions  Chaperone was present for exam:  Estill Bamberg, CMA  Assessment:   Well woman visit with gynecologic exam. Status post TAH/BSO for abnormal uterine bleeding around 1999. Uterine cancer identified at time of surgical pathology evaluation.  Lichen sclerosus.  Status post gastric bypass.  Status post left breast lumpectomy.  Estrogen deficiency.  Candida of flexural fold under pannus and in inguinal regions.  Vulvar lesion.  Plan: Mammogram screening discussed. Self breast awareness reviewed. Pap and HR HPV as above. Guidelines for Calcium, Vitamin D, regular exercise program including cardiovascular and weight bearing exercise. She will schedule bone density at [facility].  Referral for colonoscopy.  Return for vulvar biopsy and a recheck in about 2 weeks.  Follow up annually and prn.   In addition to the annual exam today, the patient was evaluated and treated for candida of flexural skin.   She will return for a vulvar biopsy due to skin changes.   After visit summary provided.

## 2021-08-12 ENCOUNTER — Encounter: Payer: Self-pay | Admitting: Obstetrics and Gynecology

## 2021-08-12 ENCOUNTER — Other Ambulatory Visit: Payer: Self-pay

## 2021-08-12 ENCOUNTER — Ambulatory Visit (INDEPENDENT_AMBULATORY_CARE_PROVIDER_SITE_OTHER): Payer: BC Managed Care – PPO | Admitting: Obstetrics and Gynecology

## 2021-08-12 VITALS — BP 130/64 | HR 82 | Ht 65.0 in | Wt 263.0 lb

## 2021-08-12 DIAGNOSIS — Z1211 Encounter for screening for malignant neoplasm of colon: Secondary | ICD-10-CM

## 2021-08-12 DIAGNOSIS — E2839 Other primary ovarian failure: Secondary | ICD-10-CM | POA: Diagnosis not present

## 2021-08-12 DIAGNOSIS — B372 Candidiasis of skin and nail: Secondary | ICD-10-CM

## 2021-08-12 DIAGNOSIS — N9089 Other specified noninflammatory disorders of vulva and perineum: Secondary | ICD-10-CM

## 2021-08-12 DIAGNOSIS — Z01419 Encounter for gynecological examination (general) (routine) without abnormal findings: Secondary | ICD-10-CM

## 2021-08-12 MED ORDER — FLUCONAZOLE 150 MG PO TABS
150.0000 mg | ORAL_TABLET | Freq: Once | ORAL | 0 refills | Status: AC
Start: 2021-08-12 — End: 2021-08-12

## 2021-08-12 NOTE — Patient Instructions (Signed)

## 2021-08-14 ENCOUNTER — Ambulatory Visit: Payer: BC Managed Care – PPO | Admitting: Family Medicine

## 2021-08-18 ENCOUNTER — Ambulatory Visit: Payer: BC Managed Care – PPO | Admitting: Nurse Practitioner

## 2021-08-18 ENCOUNTER — Other Ambulatory Visit: Payer: Self-pay

## 2021-08-18 ENCOUNTER — Encounter: Payer: Self-pay | Admitting: Nurse Practitioner

## 2021-08-18 VITALS — BP 112/76 | HR 68 | Temp 98.3°F | Ht 65.5 in | Wt 260.0 lb

## 2021-08-18 DIAGNOSIS — I10 Essential (primary) hypertension: Secondary | ICD-10-CM

## 2021-08-18 DIAGNOSIS — E039 Hypothyroidism, unspecified: Secondary | ICD-10-CM

## 2021-08-18 DIAGNOSIS — E1165 Type 2 diabetes mellitus with hyperglycemia: Secondary | ICD-10-CM | POA: Diagnosis not present

## 2021-08-18 DIAGNOSIS — J301 Allergic rhinitis due to pollen: Secondary | ICD-10-CM

## 2021-08-18 DIAGNOSIS — E782 Mixed hyperlipidemia: Secondary | ICD-10-CM

## 2021-08-18 DIAGNOSIS — Z1211 Encounter for screening for malignant neoplasm of colon: Secondary | ICD-10-CM

## 2021-08-18 MED ORDER — LORATADINE 10 MG PO TABS: 10.0000 mg | ORAL_TABLET | Freq: Every day | ORAL | 2 refills | Status: DC

## 2021-08-18 NOTE — Patient Instructions (Addendum)
Continue medications Obtain DEXA scan at [facility] as scheduled Begin Calcium supplements Begin Claritin 10 mg daily for allergic rhinitis We will call you with lab results and colonoscopy referral Follow-up in 47-month, fasting    Bone Density Test A bone density test uses a type of X-ray to measure the amount of calcium and other minerals in a person's bones. It can measure bone density in the hip and the spine. The test is similar to having a regular X-ray. This test may also be called: Bone densitometry. Bone mineral density test. Dual-energy X-ray absorptiometry (DEXA). You may have this test to: Diagnose a condition that causes weak or thin bones (osteoporosis). Screen you for osteoporosis. Predict your risk for a broken bone (fracture). Determine how well your osteoporosis treatment is working. Tell a health care provider about: Any allergies you have. All medicines you are taking, including vitamins, herbs, eye drops, creams, and over-the-counter medicines. Any problems you or family members have had with anesthetic medicines. Any blood disorders you have. Any surgeries you have had. Any medical conditions you have. Whether you are pregnant or may be pregnant. Any medical tests you have had within the past 14 days that used contrast material. What are the risks? Generally, this is a safe test. However, it does expose you to a small amount of radiation, which can slightly increase your cancer risk. What happens before the test? Do not take any calcium supplements within the 24 hours before your test. You will need to remove all metal jewelry, eyeglasses, removable dental appliances, and any other metal objects on your body. What happens during the test?  You will lie down on an exam table. There will be an X-ray generator below you and an imaging device above you. Other devices, such as boxes or braces, may be used to position your body properly for the scan. The  machine will slowly scan your body. You will need to keep very still while the machine does the scan. The images will show up on a screen in the room. Images will be examined by a specialist after your test is finished. The procedure may vary among health care providers and hospitals. What can I expect after the test? It is up to you to get the results of your test. Ask your health care provider, or the department that is doing the test, when your results will be ready. Summary A bone density test is an imaging test that uses a type of X-ray to measure the amount of calcium and other minerals in your bones. The test may be used to diagnose or screen you for a condition that causes weak or thin bones (osteoporosis), predict your risk for a broken bone (fracture), or determine how well your osteoporosis treatment is working. Do not take any calcium supplements within 24 hours before your test. Ask your health care provider, or the department that is doing the test, when your results will be ready. This information is not intended to replace advice given to you by your health care provider. Make sure you discuss any questions you have with your health care provider. Document Revised: 02/19/2021 Document Reviewed: 11/23/2019 Elsevier Patient Education  2[place]for Aging Adults Your bones do more than support your body. They also store calcium. The inside of your bones (marrow) makes blood cells. Maintaining bone health becomes more important as you age because your bones replace all their cells about every 10 years. Around age  65, it gets harder to replace those cells, and bones can become weak. Weak bones can lead to osteoporosis and breaks (fractures). Falls also become more likely, which can cause fractures. The good news is that, with diet and exercise, you can improve and maintain your bone health at any age. How to eat for bone health A balanced diet can  supply many of the vitamins, minerals, and proteins you need for bone health. Older adults need to make sure they get enough calcium, vitamin D, and magnesium. You may need more of these as you age. Calcium Calcium is the most important (essential) mineral for bone health. The daily requirement for calcium for adult men aged 60-70 years is 1,000 mg. For adult women aged 12-70 years, it is 1,200 mg. At age 76 or older, it is 1,200 mg for both men and women. Sources of calcium in your diet include: Dairy foods like milk, yogurt, and cheese. Dairy foods are the best sources. If you cannot eat dairy, you may need a calcium supplement. Leafy, dark green vegetables. These include collard greens, kale, broccoli, bok choy, and okra. Fatty fish, like sardines and canned salmon with bones. Almonds. Tofu.  Vitamin D You need vitamin D for bone health because it helps your body absorb calcium from your diet. It is not found naturally in many foods. Many people can benefit from taking a supplement. The daily requirement for vitamin D at age 5 or older is 600-1,000 international units (IU). Dietary sources for vitamin D include: Fatty fish, such as swordfish, salmon, sardine, and mackerel. Foods that have vitamin D added to them (are fortified), like cereal and dairy products. Egg yolks. Magnesium Magnesium helps your body use both calcium and vitamin D. The recommended daily intake for adult men is 400-420 mg. For adult women, it is 310-320 mg. Dietary sources of magnesium include: Green vegetables, such as collard greens, kale, bok choy, and okra. Poppy, sesame, and chia seeds. Legumes, including peas and beans. Whole grains. Avocados. Nuts. If you drink alcohol regularly or take a type of antacid called a proton pump inhibitor, you might benefit from a magnesium supplement. How to exercise for bone health Exercise is important for bone health because it strengthens bones and muscles. Bones are living  organs that get stronger when you exercise them, just like your heart and other muscles. Muscles support your bones and protect your joints. Strong muscles also help prevent bone loss and falls. Weight-bearing exercises and resistance exercises are the two types of exercise that are most important for bone health. Weight-bearing exercises include running or walking, climbing stairs, and playing sports like tennis. Resistance exercises include those done using free weights, weight machines, or resistance bands. Try to get at least 30 minutes of exercise every day. You can also include stretching, balance, and flexibility exercises, like yoga or tai chi. These lower your risk of falls. Follow these instructions at home Ask your health care provider if: You need a bone density test. This is especially important for women older than 79 and men older than 70. You have been losing height. Loss of height may be a sign of weakening bones in your spine. Any of your medicines or medical conditions could affect your bone health. He or she could recommend an exercise program that is safe for you. Be sure it includes both weight-bearing and resistance exercises. Do not use any products that contain nicotine or tobacco. These products include cigarettes, chewing tobacco, and vaping devices, such  as e-cigarettes. These can reduce bone density. If you need help quitting, ask your health care provider. Drink alcohol in moderation. Alcohol reduces bone density and increases your risk for falls. If you drink alcohol: Limit how much you have to: 0-1 drink a day for women who are not pregnant. 0-2 drinks a day for men. Know how much alcohol is in a drink. In the [place] one drink equals one 12 oz bottle of beer (355 mL), one 5 oz glass of wine (148 mL), or one 1 oz glass of hard liquor (44 mL). Take over-the-counter and prescription medicines only as told by your health care provider. Ask your health care provider if you  could benefit from taking calcium, vitamin D, or magnesium supplements. Keep all follow-up visits. This is important. Where to find more information American Bone Health: CardKnowledge.fi American Academy of Orthopaedic Surgeons: orthoinfo.[place]: bones.SouthExposed.es Summary Maintaining your bone health becomes more important as you age. You can improve and maintain your bone health with diet and exercise at any age. A balanced diet can supply many of the vitamins, minerals, and proteins you need for bone health. Older adults need to make sure they get enough calcium, vitamin D, and magnesium. Ask your health care provider if you could benefit from taking calcium, vitamin D, or magnesium supplements. Exercise is important for bone health because it strengthens bones and muscles. Do both weight-bearing and resistance exercises. This information is not intended to replace advice given to you by your health care provider. Make sure you discuss any questions you have with your health care provider. Document Revised: 11/20/2020 Document Reviewed: 11/20/2020 Elsevier Patient Education  [place].

## 2021-08-18 NOTE — Progress Notes (Addendum)
Subjective:  Patient ID: Kathleen Russo, female    DOB: Nov 02, 1960  Age: 61 y.o. MRN: [id number]  Chief Complaint  Patient presents with   Diabetes   Hyperlipidemia   Hypertension    HPI  Kathleen Russo is a 61 year old Caucasian female that presents for follow-up of type 2 DM, hypertension, and hyperlipidemia. She has RA, followed by Azucena Fallen at [facility]. She is due for screening colonoscopy. Routine eye exam up-to-date. She is scheduled for DEXA at [facility]. Had hysterectomy and has RA. Mammogram due August 2022.   Diabetes Mellitus Type II, Follow-up  Lab Results  Component Value Date   HGBA1C 6.9 (H) 04/29/2021   HGBA1C 6.9 (H) 01/24/2021   HGBA1C 7.1 (H) 10/23/2020   Wt Readings from Last 3 Encounters:  08/18/21 260 lb (117.9 kg)  08/12/21 263 lb (119.3 kg)  07/28/21 257 lb (116.6 kg)   Last seen for diabetes 3 months ago.  Management since then includes None. She reports excellent compliance with treatment. She is not having side effects.  Symptoms: No fatigue No foot ulcerations  No appetite changes No nausea  No paresthesia of the feet  No polydipsia  No polyuria No visual disturbances   No vomiting     Home blood sugar records:  102-148  Most Recent Eye Exam: UTD Current exercise: no regular exercise Current diet habits: in general, a "healthy" diet    Pertinent Labs: Lab Results  Component Value Date   CHOL 128 01/24/2021   HDL 40 01/24/2021   LDLCALC 63 01/24/2021   TRIG 141 01/24/2021   CHOLHDL 3.2 01/24/2021   Lab Results  Component Value Date   NA 141 01/24/2021   K 4.5 01/24/2021   CREATININE 0.63 01/24/2021   EGFR 101 01/24/2021   GFRNONAA 90 07/24/2020   GLUCOSE 132 (H) 01/24/2021     Hypertension, follow-up: She was last seen for hypertension 3 months ago.  BP at that visit was 110/62. Management since that visit includes None.  She reports excellent compliance with treatment. She is not having  side effects.  She is following a Regular diet. She is not exercising. She does not smoke.     Use of agents associated with hypertension: NSAIDS.    Lipid/Cholesterol, Follow-up  Last lipid panel Other pertinent labs  Lab Results  Component Value Date   CHOL 128 01/24/2021   HDL 40 01/24/2021   LDLCALC 63 01/24/2021   TRIG 141 01/24/2021   CHOLHDL 3.2 01/24/2021   Lab Results  Component Value Date   ALT 15 01/24/2021   AST 9 01/24/2021   PLT 373 01/24/2021   TSH 0.331 (L) 04/29/2021     She was last seen for this 3 months ago.  Management since that visit includes None.  She reports excellent compliance with treatment. She is not having side effects.     Current Outpatient Medications on File Prior to Visit  Medication Sig Dispense Refill   amoxicillin-clavulanate (AUGMENTIN) 875-125 MG tablet Take 1 tablet by mouth 2 (two) times daily. 20 tablet 0   Ascorbic Acid (VITAMIN C) 1000 MG tablet Take 1,000 mg by mouth daily.     benzonatate (TESSALON PERLES) 100 MG capsule Take 2 capsules (200 mg total) by mouth 3 (three) times daily as needed for cough. 30 capsule 0   Bioflavonoid Products (QUERCETIN COMPLEX IMMUNE PO) Take by mouth.     blood glucose meter kit and supplies KIT Dispense based on patient and insurance preference.  Check daily in am. E11.69. 1 each 0   chlorpheniramine-HYDROcodone (TUSSIONEX PENNKINETIC ER) 10-8 MG/5ML Take 5 mLs by mouth every 12 (twelve) hours as needed for cough. 115 mL 0   clobetasol ointment (TEMOVATE) 0.05 % Apply 1 application topically 2 (two) times daily. 60 g 1   cyanocobalamin (,VITAMIN B-12,) 1000 MCG/ML injection Inject 1 mL (1,000 mcg total) into the muscle every 30 (thirty) days. 3 mL 0   cyclobenzaprine (FLEXERIL) 5 MG tablet Take 1 tablet (5 mg total) by mouth 3 (three) times daily as needed. 90 tablet 1   etodolac (LODINE) 400 MG tablet Take 1 tablet (400 mg total) by mouth 2 (two) times daily. 180 tablet 0   fluticasone  (FLONASE) 50 MCG/ACT nasal spray Place 2 sprays into both nostrils daily. 16 g 6   folic acid (FOLVITE) 1 MG tablet Take 1 tablet (1 mg total) by mouth 2 (two) times daily. 180 tablet 3   levothyroxine (SYNTHROID) 125 MCG tablet TAKE 1 TABLET(125 MCG) BY MOUTH EVERY DAY DAILY BEFORE AND BREAKFAST 30 tablet 0   lisinopril-hydrochlorothiazide (ZESTORETIC) 10-12.5 MG tablet TAKE ONE (1) TABLET BY MOUTH ONCE DAILY 90 tablet 1   methotrexate 250 MG/10ML injection SMARTSIG:0.7 Milliliter(s) SUB-Q Once a Week     montelukast (SINGULAIR) 10 MG tablet Take 1 tablet (10 mg total) by mouth at bedtime. 90 tablet 3   ondansetron (ZOFRAN) 4 MG tablet Take 4 mg by mouth daily as needed.     ONETOUCH ULTRA test strip USE TO CHECK BLOOD SUGAR ONCE DAILY IN THE MORNING 50 each 1   OZEMPIC, 1 MG/DOSE, 4 MG/3ML SOPN INJECT 1 MG UNDER THE SKIN ONCE PER WEEK 9 mL 1   polyethylene glycol (MIRALAX / GLYCOLAX) 17 g packet Take 17 g by mouth daily.     rosuvastatin (CRESTOR) 10 MG tablet TAKE ONE (1) TABLET BY MOUTH ONCE DAILY 90 tablet 0   Vitamin D, Ergocalciferol, (DRISDOL) 1.25 MG (50000 UNIT) CAPS capsule TAKE 1 CAPSULE BY MOUTH EVERY 7 DAYS 12 capsule 0   zinc gluconate 50 MG tablet Take 50 mg by mouth daily.     No current facility-administered medications on file prior to visit.   Past Medical History:  Diagnosis Date   Anemia    Arthritis    Cancer (HCC)    UTERINE   Diabetes mellitus    DM2, not requiring meds as of 04/2011   Dizziness    GERD (gastroesophageal reflux disease)    Hemorrhoids    Hyperlipidemia    Hypertension    not requiring meds as of 04/2011   Hypothyroidism    Lichen sclerosus    PONV (postoperative nausea and vomiting)    Primary osteoarthritis    Sleep apnea    USES CPAP   Swelling of both ankles    Type 2 diabetes mellitus (HCC)    Past Surgical History:  Procedure Laterality Date   ABDOMINAL HYSTERECTOMY     BARIATRIC SURGERY     BREAST LUMPECTOMY Left    BREAST  SURGERY  1979   ELBOW SURGERY  1990'S   GASTRIC BYPASS  2007   JOINT REPLACEMENT  2002   RT. KNEE   KNEE ARTHROSCOPY  1993   LT. KNEE   KNEE ARTHROSCOPY W/ LATERAL RELEASE  1987   RT. KNEE   left hip replacement  05/2017   TONSILLECTOMY     TOTAL KNEE ARTHROPLASTY  07/06/2011   Procedure: TOTAL KNEE ARTHROPLASTY;  Surgeon: Mila Homer  Sherlean Foot, MD;  Location: [facility] OR;  Service: Orthopedics;  Laterality: Left;    Family History  Problem Relation Age of Onset   Hypertension Mother    Thyroid disease Mother    Diabetes Mother    Other Father        Wegoners disease   Thyroid disease Sister    Graves' disease Brother    Social History   Socioeconomic History   Marital status: Married    Spouse name: Not on file   Number of children: Not on file   Years of education: Not on file   Highest education level: Not on file  Occupational History   Occupation: retired  Tobacco Use   Smoking status: Never   Smokeless tobacco: Never  Vaping Use   Vaping Use: Never used  Substance and Sexual Activity   Alcohol use: No   Drug use: No   Sexual activity: Yes    Partners: Male    Birth control/protection: Surgical    Comment: hysterectomy  Other Topics Concern   Not on file  Social History Narrative   Not on file   Social Determinants of Health   Financial Resource Strain: Not on file  Food Insecurity: Not on file  Transportation Needs: Not on file  Physical Activity: Not on file  Stress: Not on file  Social Connections: Not on file    Review of Systems  Constitutional:  Negative for chills, fatigue and fever.  HENT:  Positive for ear pain. Negative for congestion, rhinorrhea and sore throat.   Respiratory:  Negative for cough and shortness of breath.   Cardiovascular:  Positive for leg swelling (intermittent). Negative for chest pain.  Gastrointestinal:  Negative for abdominal pain, constipation, diarrhea, nausea and vomiting.  Genitourinary:  Negative for dysuria and urgency.   Musculoskeletal:  Positive for arthralgias and myalgias. Negative for back pain.       Chronic pain due to RA  Allergic/Immunologic: Positive for environmental allergies.  Neurological:  Negative for dizziness, weakness, light-headedness and headaches.  Psychiatric/Behavioral:  Negative for dysphoric mood. The patient is not nervous/anxious.     Objective:  BP 112/76    Pulse 68    Temp 98.3 F (36.8 C)    Ht 5' 5.5" (1.664 m)    Wt 260 lb (117.9 kg)    SpO2 98%    BMI 42.61 kg/m    BP/Weight 08/18/2021 08/12/2021 07/28/2021  Systolic BP - 130 126  Diastolic BP - 64 74  Wt. (Lbs) 260 263 257  BMI 42.61 43.77 41.48    Physical Exam Vitals reviewed.  Constitutional:      Appearance: She is obese.  HENT:     Head: Normocephalic.     Right Ear: Tympanic membrane normal.     Left Ear: Tympanic membrane normal.     Nose: Congestion and rhinorrhea present.     Mouth/Throat:     Mouth: Mucous membranes are moist.     Pharynx: Posterior oropharyngeal erythema present.  Eyes:     Pupils: Pupils are equal, round, and reactive to light.  Cardiovascular:     Rate and Rhythm: Normal rate and regular rhythm.     Pulses: Normal pulses.     Heart sounds: Normal heart sounds.  Pulmonary:     Effort: Pulmonary effort is normal.     Breath sounds: Normal breath sounds.  Abdominal:     General: Bowel sounds are normal.     Palpations: Abdomen is soft.  Musculoskeletal:  General: Tenderness (bilateral hands) present.  Skin:    General: Skin is warm and dry.     Capillary Refill: Capillary refill takes less than 2 seconds.  Neurological:     General: No focal deficit present.     Mental Status: She is alert and oriented to person, place, and time.  Psychiatric:        Mood and Affect: Mood normal.        Behavior: Behavior normal.        Lab Results  Component Value Date   WBC 10.5 01/24/2021   HGB 12.2 01/24/2021   HCT 37.8 01/24/2021   PLT 373 01/24/2021   GLUCOSE 132  (H) 01/24/2021   CHOL 128 01/24/2021   TRIG 141 01/24/2021   HDL 40 01/24/2021   LDLCALC 63 01/24/2021   ALT 15 01/24/2021   AST 9 01/24/2021   NA 141 01/24/2021   K 4.5 01/24/2021   CL 101 01/24/2021   CREATININE 0.63 01/24/2021   BUN 13 01/24/2021   CO2 25 01/24/2021   TSH 0.331 (L) 04/29/2021   INR 0.92 06/25/2011   HGBA1C 6.9 (H) 04/29/2021   MICROALBUR 30 07/24/2020      Assessment & Plan:   1. Type 2 diabetes mellitus with hyperglycemia, without long-term current use of insulin (HCC) - CBC with Differential/Platelet - Hemoglobin A1c - Microalbumin/Creatinine Ratio, Urine  2. Essential hypertension - Comprehensive metabolic panel  3. Acquired hypothyroidism - TSH  4. Mixed hyperlipidemia - Lipid panel  5. Non-seasonal allergic rhinitis due to pollen - loratadine (CLARITIN) 10 MG tablet; Take 1 tablet (10 mg total) by mouth daily.  Dispense: 90 tablet; Refill: 2  6. Encounter for screening for malignant neoplasm of colon - Ambulatory referral to Gastroenterology   .      Continue medications Obtain DEXA scan at [facility] as scheduled Begin Calcium supplements Begin Claritin 10 mg daily for allergic rhinitis We will call you with lab results and colonoscopy referral Follow-up in 13-months, fasting  Follow-up: 3-months, fasting  An After Visit Summary was printed and given to the patient.  I, Janie Morning, NP, have reviewed all documentation for this visit. The documentation on 08/18/21 for the exam, diagnosis, procedures, and orders are all accurate and complete.    Signed, Janie Morning, NP [facility] [phone]

## 2021-08-19 LAB — COMPREHENSIVE METABOLIC PANEL
ALT: 12 IU/L (ref 0–32)
AST: 13 IU/L (ref 0–40)
Albumin/Globulin Ratio: 2.2 (ref 1.2–2.2)
Albumin: 4.4 g/dL (ref 3.8–4.8)
Alkaline Phosphatase: 106 IU/L (ref 44–121)
BUN/Creatinine Ratio: 19 (ref 12–28)
BUN: 13 mg/dL (ref 8–27)
Bilirubin Total: 0.4 mg/dL (ref 0.0–1.2)
CO2: 26 mmol/L (ref 20–29)
Calcium: 9.4 mg/dL (ref 8.7–10.3)
Chloride: 101 mmol/L (ref 96–106)
Creatinine, Ser: 0.67 mg/dL (ref 0.57–1.00)
Globulin, Total: 2 g/dL (ref 1.5–4.5)
Glucose: 123 mg/dL — ABNORMAL HIGH (ref 70–99)
Potassium: 5 mmol/L (ref 3.5–5.2)
Sodium: 142 mmol/L (ref 134–144)
Total Protein: 6.4 g/dL (ref 6.0–8.5)
eGFR: 99 mL/min/{1.73_m2} (ref >59)

## 2021-08-19 LAB — CBC WITH DIFFERENTIAL/PLATELET
Basophils Absolute: 0 10*3/uL (ref 0.0–0.2)
Basos: 1 %
EOS (ABSOLUTE): 0.1 10*3/uL (ref 0.0–0.4)
Eos: 1 %
Hematocrit: 38 % (ref 34.0–46.6)
Hemoglobin: 12.4 g/dL (ref 11.1–15.9)
Immature Grans (Abs): 0 10*3/uL (ref 0.0–0.1)
Immature Granulocytes: 0 %
Lymphocytes Absolute: 1.3 10*3/uL (ref 0.7–3.1)
Lymphs: 18 %
MCH: 27.7 pg (ref 26.6–33.0)
MCHC: 32.6 g/dL (ref 31.5–35.7)
MCV: 85 fL (ref 79–97)
Monocytes Absolute: 0.5 10*3/uL (ref 0.1–0.9)
Monocytes: 7 %
Neutrophils Absolute: 5.4 10*3/uL (ref 1.4–7.0)
Neutrophils: 73 %
Platelets: 328 10*3/uL (ref 150–450)
RBC: 4.48 x10E6/uL (ref 3.77–5.28)
RDW: 13.4 % (ref 11.7–15.4)
WBC: 7.4 10*3/uL (ref 3.4–10.8)

## 2021-08-19 LAB — HEMOGLOBIN A1C
Est. average glucose Bld gHb Est-mCnc: 163 mg/dL
Hgb A1c MFr Bld: 7.3 % — ABNORMAL HIGH (ref 4.8–5.6)

## 2021-08-19 LAB — LIPID PANEL
Chol/HDL Ratio: 2.8 ratio (ref 0.0–4.4)
Cholesterol, Total: 125 mg/dL (ref 100–199)
HDL: 44 mg/dL (ref >39)
LDL Chol Calc (NIH): 57 mg/dL (ref 0–99)
Triglycerides: 134 mg/dL (ref 0–149)
VLDL Cholesterol Cal: 24 mg/dL (ref 5–40)

## 2021-08-19 LAB — MICROALBUMIN / CREATININE URINE RATIO
Creatinine, Urine: 70.6 mg/dL
Microalb/Creat Ratio: <4 mg/g creat (ref 0–29)
Microalbumin, Urine: <3 ug/mL

## 2021-08-19 LAB — TSH: TSH: 1.15 u[IU]/mL (ref 0.450–4.500)

## 2021-08-19 LAB — CARDIOVASCULAR RISK ASSESSMENT

## 2021-08-20 ENCOUNTER — Other Ambulatory Visit: Payer: Self-pay | Admitting: Nurse Practitioner

## 2021-08-20 ENCOUNTER — Other Ambulatory Visit: Payer: Self-pay

## 2021-08-20 DIAGNOSIS — R2243 Localized swelling, mass and lump, lower limb, bilateral: Secondary | ICD-10-CM

## 2021-08-20 DIAGNOSIS — E1165 Type 2 diabetes mellitus with hyperglycemia: Secondary | ICD-10-CM

## 2021-08-20 MED ORDER — FUROSEMIDE 20 MG PO TABS: 20.0000 mg | ORAL_TABLET | Freq: Every day | ORAL | 3 refills | Status: DC | PRN

## 2021-08-20 MED ORDER — OZEMPIC (1 MG/DOSE) 4 MG/3ML ~~LOC~~ SOPN: PEN_INJECTOR | SUBCUTANEOUS | 1 refills | Status: DC

## 2021-08-21 ENCOUNTER — Other Ambulatory Visit: Payer: Self-pay

## 2021-08-21 DIAGNOSIS — E1165 Type 2 diabetes mellitus with hyperglycemia: Secondary | ICD-10-CM

## 2021-08-21 MED ORDER — SEMAGLUTIDE (2 MG/DOSE) 8 MG/3ML ~~LOC~~ SOPN: 2.0000 mg | PEN_INJECTOR | SUBCUTANEOUS | 0 refills | Status: DC

## 2021-08-27 NOTE — Progress Notes (Signed)
GYNECOLOGY  VISIT ?  ?HPI: ?61 y.o.   Married  Caucasian  female   ?[id number] with No LMP recorded. Patient has had a hysterectomy.   ?here for vulvar biopsy.   ? ?Has itching of the vulva.  ? ?Has Rx for Temovate, and last used it 3 weeks ago.  ?It does help her symptoms.  ? ?Her groin in better now.  ?She used Diflucan.  ?Not using Zsorb.  ? ?GYNECOLOGIC HISTORY: ?No LMP recorded. Patient has had a hysterectomy. ?Contraception:  Hyst ?Menopausal hormone therapy:  none ?Last mammogram:   02-21-21 Neg/BiRads1 ?Last pap smear:   03-29-18 Neg:Neg HR HPV ?       ?OB History   ? ? Gravida  ?4  ? Para  ?2  ? Term  ?2  ? Preterm  ?   ? AB  ?2  ? Living  ?2  ?  ? ? SAB  ?2  ? IAB  ?   ? Ectopic  ?   ? Multiple  ?   ? Live Births  ?2  ?   ?  ?  ?    ? ?Patient Active Problem List  ? Diagnosis Date Noted  ? B12 deficiency 04/29/2021  ? Vitamin D deficiency 04/29/2021  ? Type 2 diabetes mellitus with hyperglycemia, without long-term current use of insulin ([place]) 04/29/2021  ? Chronic fatigue syndrome 04/29/2021  ? Inflammatory arthritis 04/29/2021  ? OSA (obstructive sleep apnea)   ? Diabetes mellitus without complication ([place]) 45/40/9811  ? Breast pain in female 01/03/2020  ? Mixed hyperlipidemia 09/26/2019  ? Morbid obesity ([place]) 09/26/2019  ? BMI 40.0-44.9, adult ([place]) 09/26/2019  ? History of left hip replacement 08/27/2017  ? Essential hypertension 06/08/2017  ? Hypothyroidism 06/08/2017  ? Obesity (BMI 30-39.9) 06/08/2017  ? Combined hyperlipidemia associated with type 2 diabetes mellitus ([place]) 06/08/2017  ? Arthritis of left hip 05/12/2017  ? Chronic left hip pain 01/28/2017  ? Reduced libido 03/11/2016  ? OAB (overactive bladder) 04/03/2014  ? ? ?Past Medical History:  ?Diagnosis Date  ? Anemia   ? Arthritis   ? Cancer [facility])   ? UTERINE  ? Diabetes mellitus   ? DM2, not requiring meds as of 04/2011  ? Dizziness   ? GERD (gastroesophageal reflux disease)   ? Hemorrhoids   ? Hyperlipidemia   ? Hypertension   ? not requiring  meds as of 04/2011  ? Hypothyroidism   ? Lichen sclerosus   ? PONV (postoperative nausea and vomiting)   ? Primary osteoarthritis   ? Sleep apnea   ? USES CPAP  ? Swelling of both ankles   ? Type 2 diabetes mellitus ([place])   ? ? ?Past Surgical History:  ?Procedure Laterality Date  ? ABDOMINAL HYSTERECTOMY    ? BARIATRIC SURGERY    ? BREAST LUMPECTOMY Left   ? BREAST SURGERY  1979  ? ELBOW SURGERY  1990'S  ? GASTRIC BYPASS  2007  ? JOINT REPLACEMENT  2002  ? RT. KNEE  ? KNEE ARTHROSCOPY  1993  ? LT. KNEE  ? KNEE ARTHROSCOPY W/ LATERAL RELEASE  1987  ? RT. KNEE  ? left hip replacement  05/2017  ? TONSILLECTOMY    ? TOTAL KNEE ARTHROPLASTY  07/06/2011  ? Procedure: TOTAL KNEE ARTHROPLASTY;  Surgeon: Rudean Haskell, MD;  Location: [place];  Service: Orthopedics;  Laterality: Left;  ? ? ?Current Outpatient Medications  ?Medication Sig Dispense Refill  ? Ascorbic Acid (VITAMIN C) 1000 MG  tablet Take 1,000 mg by mouth daily.    ? benzonatate (TESSALON PERLES) 100 MG capsule Take 2 capsules (200 mg total) by mouth 3 (three) times daily as needed for cough. 30 capsule 0  ? Bioflavonoid Products (QUERCETIN COMPLEX IMMUNE PO) Take by mouth.    ? blood glucose meter kit and supplies KIT Dispense based on patient and insurance preference. Check daily in am. E11.69. 1 each 0  ? clobetasol ointment (TEMOVATE) 7.67 % Apply 1 application topically 2 (two) times daily. 60 g 1  ? cyanocobalamin (,VITAMIN B-12,) 1000 MCG/ML injection Inject 1 mL (1,000 mcg total) into the muscle every 30 (thirty) days. 3 mL 0  ? cyclobenzaprine (FLEXERIL) 5 MG tablet Take 1 tablet (5 mg total) by mouth 3 (three) times daily as needed. 90 tablet 1  ? etodolac (LODINE) 400 MG tablet Take 1 tablet (400 mg total) by mouth 2 (two) times daily. 180 tablet 0  ? fluticasone (FLONASE) 50 MCG/ACT nasal spray Place 2 sprays into both nostrils daily. 16 g 6  ? folic acid (FOLVITE) 1 MG tablet Take 1 tablet (1 mg total) by mouth 2 (two) times daily. 180 tablet 3  ?  furosemide (LASIX) 20 MG tablet Take 1 tablet (20 mg total) by mouth daily as needed for edema. 90 tablet 3  ? levothyroxine (SYNTHROID) 125 MCG tablet TAKE 1 TABLET(125 MCG) BY MOUTH EVERY DAY DAILY BEFORE AND BREAKFAST 30 tablet 0  ? lisinopril-hydrochlorothiazide (ZESTORETIC) 10-12.5 MG tablet TAKE ONE (1) TABLET BY MOUTH ONCE DAILY 90 tablet 1  ? loratadine (CLARITIN) 10 MG tablet Take 1 tablet (10 mg total) by mouth daily. 90 tablet 2  ? methotrexate 250 MG/10ML injection SMARTSIG:0.7 Milliliter(s) SUB-Q Once a Week    ? montelukast (SINGULAIR) 10 MG tablet Take 1 tablet (10 mg total) by mouth at bedtime. 90 tablet 3  ? ondansetron (ZOFRAN) 4 MG tablet Take 4 mg by mouth daily as needed.    ? ONETOUCH ULTRA test strip USE TO CHECK BLOOD SUGAR ONCE DAILY IN THE MORNING 50 each 1  ? polyethylene glycol (MIRALAX / GLYCOLAX) 17 g packet Take 17 g by mouth daily.    ? rosuvastatin (CRESTOR) 10 MG tablet TAKE ONE (1) TABLET BY MOUTH ONCE DAILY 90 tablet 0  ? Semaglutide, 2 MG/DOSE, 8 MG/3ML SOPN Inject 2 mg as directed once a week. 9 mL 0  ? Vitamin D, Ergocalciferol, (DRISDOL) 1.25 MG (50000 UNIT) CAPS capsule TAKE 1 CAPSULE BY MOUTH EVERY 7 DAYS 12 capsule 0  ? zinc gluconate 50 MG tablet Take 50 mg by mouth daily.    ? ?No current facility-administered medications for this visit.  ?  ? ?ALLERGIES: Quinolones and Tolmetin ? ?Family History  ?Problem Relation Age of Onset  ? Hypertension Mother   ? Thyroid disease Mother   ? Diabetes Mother   ? Other Father   ?     Wegoners disease  ? Thyroid disease Sister   ? Graves' disease Brother   ? ? ?Social History  ? ?Socioeconomic History  ? Marital status: Married  ?  Spouse name: Not on file  ? Number of children: Not on file  ? Years of education: Not on file  ? Highest education level: Not on file  ?Occupational History  ? Occupation: retired  ?Tobacco Use  ? Smoking status: Never  ? Smokeless tobacco: Never  ?Vaping Use  ? Vaping Use: Never used  ?Substance and Sexual  Activity  ? Alcohol use:  No  ? Drug use: No  ? Sexual activity: Yes  ?  Partners: Male  ?  Birth control/protection: Surgical  ?  Comment: hysterectomy  ?Other Topics Concern  ? Not on file  ?Social History Narrative  ? Not on file  ? ?Social Determinants of Health  ? ?Financial Resource Strain: Not on file  ?Food Insecurity: Not on file  ?Transportation Needs: Not on file  ?Physical Activity: Not on file  ?Stress: Not on file  ?Social Connections: Not on file  ?Intimate Partner Violence: Not on file  ? ? ?Review of Systems  ?All other systems reviewed and are negative. ? ?PHYSICAL EXAMINATION:   ? ?BP 122/78   Pulse 76   Ht $R'5\' 5"'HN$  (1.651 m)   Wt 263 lb (119.3 kg)   SpO2 98%   BMI 43.77 kg/m?     ?General appearance: alert, cooperative and appears stated age ?  ?Inguinal regions with minimal erythema.   ? ?Pelvic: External genitalia:  right superior labia minora with stellate 5 mm lesion to right of clitoris.  ?       ?Procedure - vulvar biopsy ?Consent done.  ?Sterile prep with betadine.  ?Local 1% lidocaine, lot [id number], exp 10/21/22 ?3 mm punch biopsy done.  ?Silver nitrate place on biopsy site. ?Tissue to pathology.  ?No complications.  ?Minimal EBL. ? ?Chaperone was present for exam:  Lovena Le, CMA ? ?ASSESSMENT ? ?Vulvar lesion.  ?Candida of flexural folds improved.  ? ?PLAN ? ?FU biopsy result.  ?Rx for Nystatin powder.  ?Fu prn.  ?  ?An After Visit Summary was printed and given to the patient. ? ?  ? ?

## 2021-08-28 ENCOUNTER — Other Ambulatory Visit: Payer: Self-pay

## 2021-08-28 ENCOUNTER — Ambulatory Visit: Payer: BC Managed Care – PPO | Admitting: Obstetrics and Gynecology

## 2021-08-28 ENCOUNTER — Other Ambulatory Visit (HOSPITAL_COMMUNITY)
Admission: RE | Admit: 2021-08-28 | Discharge: 2021-08-28 | Disposition: A | Discharge status: Home / Self Care | Payer: BC Managed Care – PPO | Source: Ambulatory Visit | Attending: Obstetrics and Gynecology | Admitting: Obstetrics and Gynecology

## 2021-08-28 ENCOUNTER — Encounter: Payer: Self-pay | Admitting: Obstetrics and Gynecology

## 2021-08-28 DIAGNOSIS — N9089 Other specified noninflammatory disorders of vulva and perineum: Secondary | ICD-10-CM | POA: Insufficient documentation

## 2021-08-28 MED ORDER — NYSTATIN 100000 UNIT/GM EX POWD: 1.0000 "application " | Freq: Three times a day (TID) | CUTANEOUS | 2 refills | Status: DC

## 2021-08-28 NOTE — Patient Instructions (Signed)
Vulva Biopsy, Care After ?This sheet gives you information about how to care for yourself after your procedure. Your doctor may also give you more specific instructions. If you have problems or questions, contact your doctor. ?What can I expect after the procedure? ?After the procedure, it is common to have: ?Slight bleeding from the biopsy site. ?Soreness at the biopsy site. ?Follow these instructions at home: ?Biopsy site care ? ?Follow instructions from your doctor about how to take care of your biopsy site. Make sure you: ?Clean the area using water and mild soap two times a day or as told by your doctor. Gently pat the area dry. ?If you were prescribed an antibiotic ointment, apply it as told by your doctor. Do not stop using the antibiotic even if your condition gets better. ?Take a warm water bath (sitz bath) as needed to help with pain. A sitz bath is taken while you are sitting down. The water should only come up to your hips and cover your butt. ?Leave stitches (sutures), skin glue, or skin tape (adhesive) strips in place. They may need to stay in place for 2 weeks or longer. If tape strips get loose and curl up, you may trim the loose edges. Do not remove tape strips completely unless your doctor says it is okay. ?Check your biopsy area every day for signs of infection. Check for: ?More redness, swelling, or pain. ?More fluid or blood. ?Warmth. ?Pus or a bad smell. ?Do not rub the biopsy area after peeing (urinating). ?Gently pat the area dry, or use a bottle filled with warm water (peri-bottle) to clean the area. ?Gently wipe from front to back. ?Lifestyle ?Wear loose, cotton underwear. ?Do not wear tight pants. ?Do not use a tampon, douche, or put anything in your vagina for at least 1 week or until your doctor says it is okay. ?Do not have sex for at least 1 week or until your doctor says it is okay. ?Do not exercise until your doctor says it is okay. ?Do not swim or use a hot tub until your doctor  says it is okay. You may shower or take a sitz bath. ?General instructions ?Take over-the-counter and prescription medicines only as told by your doctor. ?Use a sanitary pad until the bleeding stops. ?Keep all follow-up visits as told by your doctor. This is important. ?Contact a doctor if: ?You have more redness, swelling, or pain around your biopsy site. ?You have more fluid or blood coming from your biopsy site. ?Your biopsy site feels warm when you touch it. ?Medicines do not help with your pain. ?Get help right away if: ?You have a lot of bleeding from the vulva. ?You have pus or a bad smell coming from your biopsy site. ?You have a fever. ?You have pain in the lower belly (abdomen). ?Summary ?After the procedure, it is common to have slight bleeding and soreness at the biopsy site. ?Follow all instructions as told by your doctor. Clean the area with water and mild soap. Do not rub. Pat the area dry. ?Take sitz baths as needed. Leave any stitches in place. ?Check your biopsy site for infection. Signs include more redness, swelling, pain, fluid, or blood, or feeling warm when you touch it. ?Get help right away if you have a lot of bleeding, a fever, pus or a bad smell, or pain in your lower belly. ?This information is not intended to replace advice given to you by your health care provider. Make sure you discuss any  questions you have with your health care provider. ?Document Revised: 12/08/2017 Document Reviewed: 12/09/2017 ?Elsevier Patient Education ? [place]. ? ?

## 2021-09-04 ENCOUNTER — Other Ambulatory Visit: Payer: Self-pay | Admitting: Family Medicine

## 2021-09-04 NOTE — Telephone Encounter (Signed)
Refill sent to pharmacy.   

## 2021-09-05 ENCOUNTER — Other Ambulatory Visit: Payer: Self-pay | Admitting: Family Medicine

## 2021-09-09 NOTE — Telephone Encounter (Signed)
Please contact [facility] and request that Dr. Claudette Laws, pathologist re-read the slides.  ? ?Also let the patient know that we are requesting a second opinion from another pathologist.  ?

## 2021-09-10 ENCOUNTER — Telehealth: Payer: Self-pay

## 2021-09-10 NOTE — Telephone Encounter (Signed)
At Dr. Elza Rafter request I called [facility] [phone] and requested 2nd look at pathology result by Dr. Claudette Laws. ?I advised patient in My Chart that as soon as Dr. Quincy Simmonds hears from him we will be in touch. ?

## 2021-09-15 LAB — SURGICAL PATHOLOGY

## 2021-09-15 NOTE — Telephone Encounter (Signed)
Dr. Saralyn Pilar called. 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  He will addend the report to indicate he reviewed it as well and confirmed condyloma diagnosis.  ?

## 2021-09-19 NOTE — Telephone Encounter (Signed)
Patient informed. She would like to schedule an office visit. Message sent to appointments to schedule. ?

## 2021-09-19 NOTE — Telephone Encounter (Signed)
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  ? ?The area was very small, and the biopsy I did may have removed it entirely.  ? ?There are additional treatments that can be done if there are any remaining areas of condyloma. ? ?I welcome her for an appointment if she has any questions.  ?

## 2021-09-30 ENCOUNTER — Other Ambulatory Visit: Payer: Self-pay

## 2021-09-30 MED ORDER — LISINOPRIL-HYDROCHLOROTHIAZIDE 10-12.5 MG PO TABS: ORAL_TABLET | ORAL | 1 refills | Status: DC

## 2021-10-14 ENCOUNTER — Other Ambulatory Visit: Payer: Self-pay | Admitting: Obstetrics and Gynecology

## 2021-10-14 NOTE — Telephone Encounter (Signed)
AEX 08/12/21. ?

## 2021-10-17 ENCOUNTER — Other Ambulatory Visit: Payer: Self-pay | Admitting: Family Medicine

## 2021-10-17 NOTE — Telephone Encounter (Signed)
Refill sent to pharmacy.   

## 2021-11-13 NOTE — Progress Notes (Signed)
Subjective:  Patient ID: Kathleen Russo, female    DOB: 1960/08/29  Age: 61 y.o. MRN: [id number]  Chief Complaint  Patient presents with   Diabetes   Hyperlipidemia   HPI: Diabetes:  Complications: nephropathy Glucose checking:3 times per week Glucose logs:120-130s Hypoglycemia: no Most recent A1C: 7.3 Current medications: Ozempic 2 mg inject once weekly,  Last Eye Exam:04/29/2021 Foot checks:daily  Hyperlipidemia: Current medications: Patient is currently taking Rosuvastatin 10 mg take 1 tablet daily.  Hypertension: Current medications: Lisinopril/HCTZ 10-12.5 mg take 1 tablet daily, furosemide 20 mg take 1 tablet daily PRN edema.   Hypothyroidism: Patient is currently taking Levothyroxine 125 mcg take 1 tablet daily.  Vitamin D deficiency: Patient is currently taking Vitamin D 50,000 units 1 capsule once weekly.  Diet: healthy Exercise: walking more.  RA: on mtx and folate. Needs labs.   Current Outpatient Medications on File Prior to Visit  Medication Sig Dispense Refill   Ascorbic Acid (VITAMIN C) 1000 MG tablet Take 1,000 mg by mouth daily.     benzonatate (TESSALON PERLES) 100 MG capsule Take 2 capsules (200 mg total) by mouth 3 (three) times daily as needed for cough. 30 capsule 0   cyanocobalamin (,VITAMIN B-12,) 1000 MCG/ML injection Inject 1 mL (1,000 mcg total) into the muscle every 30 (thirty) days. 3 mL 0   cyclobenzaprine (FLEXERIL) 5 MG tablet Take 1 tablet (5 mg total) by mouth 3 (three) times daily as needed. 90 tablet 1   fluticasone (FLONASE) 50 MCG/ACT nasal spray Place 2 sprays into both nostrils daily. 16 g 6   folic acid (FOLVITE) 1 MG tablet Take 1 tablet (1 mg total) by mouth 2 (two) times daily. 180 tablet 3   furosemide (LASIX) 20 MG tablet Take 1 tablet (20 mg total) by mouth daily as needed for edema. 90 tablet 3   levothyroxine (SYNTHROID) 125 MCG tablet TAKE 1 TABLET(125 MCG) BY MOUTH EVERY DAY DAILY BEFORE AND BREAKFAST 30 tablet 0    lisinopril-hydrochlorothiazide (ZESTORETIC) 10-12.5 MG tablet TAKE ONE (1) TABLET BY MOUTH ONCE DAILY 90 tablet 1   loratadine (CLARITIN) 10 MG tablet Take 1 tablet (10 mg total) by mouth daily. 90 tablet 2   methotrexate 250 MG/10ML injection SMARTSIG:0.7 Milliliter(s) SUB-Q Once a Week     montelukast (SINGULAIR) 10 MG tablet Take 1 tablet (10 mg total) by mouth at bedtime. 90 tablet 3   nystatin (MYCOSTATIN/NYSTOP) powder Apply 1 application. topically 3 (three) times daily. Apply to affected area for up to 7 days 30 g 2   ondansetron (ZOFRAN) 4 MG tablet Take 4 mg by mouth daily as needed.     ONETOUCH ULTRA test strip USE TO CHECK BLOOD SUGAR ONCE DAILY IN THE MORNING 50 each 1   polyethylene glycol (MIRALAX / GLYCOLAX) 17 g packet Take 17 g by mouth daily.     rosuvastatin (CRESTOR) 10 MG tablet TAKE 1 TABLET BY MOUTH EVERY DAY 90 tablet 0   Semaglutide, 2 MG/DOSE, 8 MG/3ML SOPN Inject 2 mg as directed once a week. 9 mL 0   Vitamin D, Ergocalciferol, (DRISDOL) 1.25 MG (50000 UNIT) CAPS capsule TAKE 1 CAPSULE BY MOUTH EVERY 7 DAYS 12 capsule 0   blood glucose meter kit and supplies KIT Dispense based on patient and insurance preference. Check daily in am. E11.69. 1 each 0   clobetasol ointment (TEMOVATE) 0.05 % Apply 1 application topically 2 (two) times daily. 60 g 1   zinc gluconate 50 MG tablet Take 50  mg by mouth daily. (Patient not taking: Reported on 11/19/2021)     No current facility-administered medications on file prior to visit.   Past Medical History:  Diagnosis Date   Anemia    Arthritis    Cancer (HCC)    UTERINE   Diabetes mellitus    DM2, not requiring meds as of 04/2011   Dizziness    GERD (gastroesophageal reflux disease)    Hemorrhoids    Hyperlipidemia    Hypertension    not requiring meds as of 04/2011   Hypothyroidism    Lichen sclerosus    PONV (postoperative nausea and vomiting)    Primary osteoarthritis    Sleep apnea    USES CPAP   Swelling of both  ankles    Type 2 diabetes mellitus (HCC)    Past Surgical History:  Procedure Laterality Date   ABDOMINAL HYSTERECTOMY     BARIATRIC SURGERY     BREAST LUMPECTOMY Left    BREAST SURGERY  1979   ELBOW SURGERY  1990'S   GASTRIC BYPASS  2007   JOINT REPLACEMENT  2002   RT. KNEE   KNEE ARTHROSCOPY  1993   LT. KNEE   KNEE ARTHROSCOPY W/ LATERAL RELEASE  1987   RT. KNEE   left hip replacement  05/2017   TONSILLECTOMY     TOTAL KNEE ARTHROPLASTY  07/06/2011   Procedure: TOTAL KNEE ARTHROPLASTY;  Surgeon: Raymon Mutton, MD;  Location: [facility] OR;  Service: Orthopedics;  Laterality: Left;    Family History  Problem Relation Age of Onset   Hypertension Mother    Thyroid disease Mother    Diabetes Mother    Other Father        Wegoners disease   Thyroid disease Sister    Diabetes Sister    Graves' disease Brother    Social History   Socioeconomic History   Marital status: Married    Spouse name: Not on file   Number of children: Not on file   Years of education: Not on file   Highest education level: Not on file  Occupational History   Occupation: retired  Tobacco Use   Smoking status: Never   Smokeless tobacco: Never  Vaping Use   Vaping Use: Never used  Substance and Sexual Activity   Alcohol use: No   Drug use: No   Sexual activity: Yes    Partners: Male    Birth control/protection: Surgical    Comment: hysterectomy  Other Topics Concern   Not on file  Social History Narrative   Not on file   Social Determinants of Health   Financial Resource Strain: Not on file  Food Insecurity: Not on file  Transportation Needs: Not on file  Physical Activity: Not on file  Stress: Not on file  Social Connections: Not on file    Review of Systems  Constitutional:  Negative for appetite change, fatigue and fever.  HENT:  Negative for congestion, ear pain, sinus pressure and sore throat.   Respiratory:  Negative for cough, chest tightness, shortness of breath and wheezing.    Cardiovascular:  Negative for chest pain and palpitations.  Gastrointestinal:  Negative for abdominal pain, constipation, diarrhea, nausea and vomiting.  Genitourinary:  Negative for dysuria and hematuria.  Musculoskeletal:  Positive for arthralgias (diffuse joint pain). Negative for back pain, joint swelling and myalgias.  Skin:  Negative for rash.  Neurological:  Negative for dizziness, weakness and headaches.  Psychiatric/Behavioral:  Negative for dysphoric mood. The  patient is not nervous/anxious.      Objective:  BP 114/64   Pulse 78   Temp (!) 96.3 F (35.7 C)   Resp 18   Ht 5\' 5"  (1.651 m)   Wt 263 lb (119.3 kg)   BMI 43.77 kg/m      11/19/2021    8:03 AM 08/28/2021   11:57 AM 08/18/2021    7:45 AM  BP/Weight  Systolic BP 114 122 112  Diastolic BP 64 78 76  Wt. (Lbs) 263 263 260  BMI 43.77 kg/m2 43.77 kg/m2 42.61 kg/m2    Physical Exam Vitals reviewed.  Constitutional:      Appearance: Normal appearance. She is normal weight.  Cardiovascular:     Rate and Rhythm: Normal rate and regular rhythm.     Heart sounds: Normal heart sounds.  Pulmonary:     Effort: Pulmonary effort is normal.     Breath sounds: Normal breath sounds.  Abdominal:     General: Abdomen is flat. Bowel sounds are normal.     Palpations: Abdomen is soft.  Neurological:     Mental Status: She is alert and oriented to person, place, and time.  Psychiatric:        Mood and Affect: Mood normal.        Behavior: Behavior normal.     Diabetic Foot Exam - Simple   Simple Foot Form Diabetic Foot exam was performed with the following findings: Yes 11/19/2021  9:00 AM  Visual Inspection See comments: Yes Sensation Testing Intact to touch and monofilament testing bilaterally: Yes Pulse Check Posterior Tibialis and Dorsalis pulse intact bilaterally: Yes Comments Pes planus BL      Lab Results  Component Value Date   WBC 9.2 11/19/2021   HGB 13.4 11/19/2021   HCT 40.6 11/19/2021   PLT  354 11/19/2021   GLUCOSE 114 (H) 11/19/2021   CHOL 162 11/19/2021   TRIG 111 11/19/2021   HDL 64 11/19/2021   LDLCALC 78 11/19/2021   ALT 17 11/19/2021   AST 12 11/19/2021   NA 139 11/19/2021   K 4.9 11/19/2021   CL 98 11/19/2021   CREATININE 0.69 11/19/2021   BUN 14 11/19/2021   CO2 26 11/19/2021   TSH 1.150 08/18/2021   INR 0.92 06/25/2011   HGBA1C 7.2 (H) 11/19/2021   MICROALBUR 30 07/24/2020      Assessment & Plan:   Problem List Items Addressed This Visit       Cardiovascular and Mediastinum   Hypertension associated with diabetes (HCC)    Well controlled.  No changes to medicines.  Continue to work on eating a healthy diet and exercise.  Labs drawn today.          Respiratory   Non-seasonal allergic rhinitis due to pollen     Endocrine   Hypothyroidism    The current medical regimen is effective;  continue present plan and medications. Continue levothyroxine 125 mcg once daily in am.       Type 2 diabetes mellitus with hyperglycemia, without long-term current use of insulin (HCC) - Primary    Control: fair Recommend check sugars fasting daily. Recommend check feet daily. Recommend annual eye exams. Medicines: ozempic 2 mg weekly.  Continue to work on eating a healthy diet and exercise.  Labs drawn today.         Relevant Orders   Hemoglobin A1c (Completed)     Musculoskeletal and Integument   Inflammatory arthritis    The current medical  regimen is effective;  continue present plan and medications. Management per specialist.          Other   Localized swelling of both lower legs    Continue Zestoretic      Mixed hyperlipidemia    Well controlled.  No changes to medicines.  Continue to work on eating a healthy diet and exercise.  Labs drawn today.        Relevant Orders   Lipid panel (Completed)   Morbid obesity (HCC)    Recommend continue to work on eating healthy diet and exercise.       BMI 40.0-44.9, adult (HCC)     Recommend continue to work on eating healthy diet and exercise.       Vitamin D deficiency    The current medical regimen is effective;  continue present plan and medications.      .  Orders Placed This Encounter  Procedures   CBC with Differential/Platelet   Comprehensive metabolic panel   Hemoglobin A1c   Lipid panel   POCT URINALYSIS DIP (CLINITEK)     Follow-up: Return in about 3 months (around 02/19/2022) for chronic fasting.  An After Visit Summary was printed and given to the patient.  Total time spent on today's visit was greater than 30 minutes, including both face-to-face time and nonface-to-face time personally spent on review of chart (labs and imaging), discussing labs and goals, discussing further work-up, treatment options, referrals to specialist if needed, reviewing outside records of pertinent, answering patient's questions, and coordination.  I,Lauren M Auman,acting as a scribe for Blane Ohara, MD.,have documented all relevant documentation on the behalf of Blane Ohara, MD,as directed by  Blane Ohara, MD while in the presence of Blane Ohara, MD.   Blane Ohara, MD [facility] [phone]

## 2021-11-19 ENCOUNTER — Ambulatory Visit: Payer: BC Managed Care – PPO | Admitting: Family Medicine

## 2021-11-19 ENCOUNTER — Encounter: Payer: Self-pay | Admitting: Family Medicine

## 2021-11-19 VITALS — BP 114/64 | HR 78 | Temp 96.3°F | Resp 18 | Ht 65.0 in | Wt 263.0 lb

## 2021-11-19 DIAGNOSIS — E039 Hypothyroidism, unspecified: Secondary | ICD-10-CM | POA: Diagnosis not present

## 2021-11-19 DIAGNOSIS — E1159 Type 2 diabetes mellitus with other circulatory complications: Secondary | ICD-10-CM | POA: Diagnosis not present

## 2021-11-19 DIAGNOSIS — Z6841 Body Mass Index (BMI) 40.0 and over, adult: Secondary | ICD-10-CM

## 2021-11-19 DIAGNOSIS — E559 Vitamin D deficiency, unspecified: Secondary | ICD-10-CM

## 2021-11-19 DIAGNOSIS — M1380 Other specified arthritis, unspecified site: Secondary | ICD-10-CM

## 2021-11-19 DIAGNOSIS — I10 Essential (primary) hypertension: Secondary | ICD-10-CM

## 2021-11-19 DIAGNOSIS — J301 Allergic rhinitis due to pollen: Secondary | ICD-10-CM

## 2021-11-19 DIAGNOSIS — E782 Mixed hyperlipidemia: Secondary | ICD-10-CM

## 2021-11-19 DIAGNOSIS — E1165 Type 2 diabetes mellitus with hyperglycemia: Secondary | ICD-10-CM | POA: Diagnosis not present

## 2021-11-19 DIAGNOSIS — R2243 Localized swelling, mass and lump, lower limb, bilateral: Secondary | ICD-10-CM

## 2021-11-19 DIAGNOSIS — I152 Hypertension secondary to endocrine disorders: Secondary | ICD-10-CM | POA: Diagnosis not present

## 2021-11-19 DIAGNOSIS — M1990 Unspecified osteoarthritis, unspecified site: Secondary | ICD-10-CM

## 2021-11-19 DIAGNOSIS — E119 Type 2 diabetes mellitus without complications: Secondary | ICD-10-CM

## 2021-11-19 NOTE — Patient Instructions (Signed)
Recommend continue to work on eating healthy diet and exercise.  

## 2021-11-20 LAB — CBC WITH DIFFERENTIAL/PLATELET
Basophils Absolute: 0.1 10*3/uL (ref 0.0–0.2)
Basos: 1 %
EOS (ABSOLUTE): 0.1 10*3/uL (ref 0.0–0.4)
Eos: 1 %
Hematocrit: 40.6 % (ref 34.0–46.6)
Hemoglobin: 13.4 g/dL (ref 11.1–15.9)
Immature Grans (Abs): 0.1 10*3/uL (ref 0.0–0.1)
Immature Granulocytes: 1 %
Lymphocytes Absolute: 1.4 10*3/uL (ref 0.7–3.1)
Lymphs: 15 %
MCH: 29.1 pg (ref 26.6–33.0)
MCHC: 33 g/dL (ref 31.5–35.7)
MCV: 88 fL (ref 79–97)
Monocytes Absolute: 0.6 10*3/uL (ref 0.1–0.9)
Monocytes: 6 %
Neutrophils Absolute: 7 10*3/uL (ref 1.4–7.0)
Neutrophils: 76 %
Platelets: 354 10*3/uL (ref 150–450)
RBC: 4.61 x10E6/uL (ref 3.77–5.28)
RDW: 13.9 % (ref 11.7–15.4)
WBC: 9.2 10*3/uL (ref 3.4–10.8)

## 2021-11-20 LAB — LIPID PANEL
Chol/HDL Ratio: 2.5 ratio (ref 0.0–4.4)
Cholesterol, Total: 162 mg/dL (ref 100–199)
HDL: 64 mg/dL (ref >39)
LDL Chol Calc (NIH): 78 mg/dL (ref 0–99)
Triglycerides: 111 mg/dL (ref 0–149)
VLDL Cholesterol Cal: 20 mg/dL (ref 5–40)

## 2021-11-20 LAB — COMPREHENSIVE METABOLIC PANEL
ALT: 17 IU/L (ref 0–32)
AST: 12 IU/L (ref 0–40)
Albumin/Globulin Ratio: 1.9 (ref 1.2–2.2)
Albumin: 4.2 g/dL (ref 3.8–4.8)
Alkaline Phosphatase: 100 IU/L (ref 44–121)
BUN/Creatinine Ratio: 20 (ref 12–28)
BUN: 14 mg/dL (ref 8–27)
Bilirubin Total: 0.3 mg/dL (ref 0.0–1.2)
CO2: 26 mmol/L (ref 20–29)
Calcium: 9.2 mg/dL (ref 8.7–10.3)
Chloride: 98 mmol/L (ref 96–106)
Creatinine, Ser: 0.69 mg/dL (ref 0.57–1.00)
Globulin, Total: 2.2 g/dL (ref 1.5–4.5)
Glucose: 114 mg/dL — ABNORMAL HIGH (ref 70–99)
Potassium: 4.9 mmol/L (ref 3.5–5.2)
Sodium: 139 mmol/L (ref 134–144)
Total Protein: 6.4 g/dL (ref 6.0–8.5)
eGFR: 99 mL/min/{1.73_m2} (ref >59)

## 2021-11-20 LAB — HEMOGLOBIN A1C
Est. average glucose Bld gHb Est-mCnc: 160 mg/dL
Hgb A1c MFr Bld: 7.2 % — ABNORMAL HIGH (ref 4.8–5.6)

## 2021-11-23 NOTE — Assessment & Plan Note (Signed)
The current medical regimen is effective;  continue present plan and medications.  

## 2021-11-23 NOTE — Assessment & Plan Note (Signed)
Well controlled.  ?No changes to medicines.  ?Continue to work on eating a healthy diet and exercise.  ?Labs drawn today.  ?

## 2021-11-23 NOTE — Assessment & Plan Note (Signed)
The current medical regimen is effective;  continue present plan and medications. Management per specialist.   

## 2021-11-23 NOTE — Assessment & Plan Note (Signed)
Recommend continue to work on eating healthy diet and exercise.  

## 2021-11-23 NOTE — Assessment & Plan Note (Signed)
The current medical regimen is effective;  continue present plan and medications. Continue levothyroxine 125 mcg once daily in am.

## 2021-11-23 NOTE — Assessment & Plan Note (Signed)
Control: fair Recommend check sugars fasting daily. Recommend check feet daily. Recommend annual eye exams. Medicines: ozempic 2 mg weekly.  Continue to work on eating a healthy diet and exercise.  Labs drawn today.

## 2021-11-29 DIAGNOSIS — J301 Allergic rhinitis due to pollen: Secondary | ICD-10-CM | POA: Insufficient documentation

## 2021-11-29 DIAGNOSIS — R2243 Localized swelling, mass and lump, lower limb, bilateral: Secondary | ICD-10-CM | POA: Insufficient documentation

## 2021-11-29 LAB — POCT URINALYSIS DIP (CLINITEK)
Bilirubin, UA: NEGATIVE
Blood, UA: NEGATIVE
Glucose, UA: NEGATIVE mg/dL
Ketones, POC UA: NEGATIVE mg/dL
Leukocytes, UA: NEGATIVE
Nitrite, UA: NEGATIVE
POC PROTEIN,UA: NEGATIVE
Spec Grav, UA: 1.02 (ref 1.010–1.025)
Urobilinogen, UA: NEGATIVE E.U./dL — AB
pH, UA: 6.5 (ref 5.0–8.0)

## 2021-11-29 NOTE — Assessment & Plan Note (Signed)
Continue Zestoretic 

## 2021-12-05 ENCOUNTER — Other Ambulatory Visit: Payer: Self-pay | Admitting: Family Medicine

## 2021-12-05 DIAGNOSIS — E782 Mixed hyperlipidemia: Secondary | ICD-10-CM

## 2021-12-10 ENCOUNTER — Other Ambulatory Visit: Payer: Self-pay | Admitting: Family Medicine

## 2021-12-10 DIAGNOSIS — E782 Mixed hyperlipidemia: Secondary | ICD-10-CM

## 2021-12-12 ENCOUNTER — Other Ambulatory Visit: Payer: Self-pay | Admitting: Family Medicine

## 2021-12-12 MED ORDER — LEVOTHYROXINE SODIUM 125 MCG PO TABS: 125.0000 ug | ORAL_TABLET | Freq: Every day | ORAL | 1 refills | Status: DC

## 2021-12-26 ENCOUNTER — Other Ambulatory Visit: Payer: Self-pay | Admitting: Family Medicine

## 2021-12-26 DIAGNOSIS — E1165 Type 2 diabetes mellitus with hyperglycemia: Secondary | ICD-10-CM

## 2021-12-29 ENCOUNTER — Other Ambulatory Visit: Payer: Self-pay | Admitting: Family Medicine

## 2021-12-29 DIAGNOSIS — E1165 Type 2 diabetes mellitus with hyperglycemia: Secondary | ICD-10-CM

## 2022-01-12 ENCOUNTER — Other Ambulatory Visit: Payer: Self-pay | Admitting: Family Medicine

## 2022-01-20 HISTORY — PX: SHOULDER SURGERY: SHX246

## 2022-02-20 ENCOUNTER — Other Ambulatory Visit: Payer: Self-pay | Admitting: Family Medicine

## 2022-02-20 DIAGNOSIS — Z1231 Encounter for screening mammogram for malignant neoplasm of breast: Secondary | ICD-10-CM

## 2022-02-24 ENCOUNTER — Ambulatory Visit: Payer: BC Managed Care – PPO | Admitting: Family Medicine

## 2022-02-24 ENCOUNTER — Encounter: Payer: Self-pay | Admitting: Family Medicine

## 2022-02-24 VITALS — BP 124/68 | HR 78 | Temp 97.0°F | Resp 18 | Ht 65.0 in | Wt 255.0 lb

## 2022-02-24 DIAGNOSIS — G4733 Obstructive sleep apnea (adult) (pediatric): Secondary | ICD-10-CM

## 2022-02-24 DIAGNOSIS — E1159 Type 2 diabetes mellitus with other circulatory complications: Secondary | ICD-10-CM

## 2022-02-24 DIAGNOSIS — E1165 Type 2 diabetes mellitus with hyperglycemia: Secondary | ICD-10-CM

## 2022-02-24 DIAGNOSIS — M0579 Rheumatoid arthritis with rheumatoid factor of multiple sites without organ or systems involvement: Secondary | ICD-10-CM

## 2022-02-24 DIAGNOSIS — E559 Vitamin D deficiency, unspecified: Secondary | ICD-10-CM

## 2022-02-24 DIAGNOSIS — E538 Deficiency of other specified B group vitamins: Secondary | ICD-10-CM

## 2022-02-24 DIAGNOSIS — I152 Hypertension secondary to endocrine disorders: Secondary | ICD-10-CM

## 2022-02-24 DIAGNOSIS — E039 Hypothyroidism, unspecified: Secondary | ICD-10-CM | POA: Diagnosis not present

## 2022-02-24 DIAGNOSIS — E782 Mixed hyperlipidemia: Secondary | ICD-10-CM

## 2022-02-24 NOTE — Assessment & Plan Note (Signed)
Check b12. 

## 2022-02-24 NOTE — Assessment & Plan Note (Signed)
Check level 

## 2022-02-24 NOTE — Assessment & Plan Note (Signed)
CPAP no longer needed due to gastric bypass and weight loss.

## 2022-02-24 NOTE — Assessment & Plan Note (Signed)
Well controlled.  No changes to medicines. Continue Lisinopril-hydrochlorothiazide 10-12.5 daily  Continue to work on eating a healthy diet and exercise.  Labs drawn today.

## 2022-02-24 NOTE — Assessment & Plan Note (Signed)
Await labs/testing for assessment and recommendations. Recommend check sugars fasting daily. Recommend check feet daily. Recommend annual eye exams. Medicines: continue ozempic 2 mg weekly.  Continue to work on eating a healthy diet and exercise.  Labs drawn today.

## 2022-02-24 NOTE — Assessment & Plan Note (Signed)
Previously well controlled Continue Synthroid at current dose  Recheck TSH and adjust Synthroid as indicated   

## 2022-02-24 NOTE — Assessment & Plan Note (Signed)
The current medical regimen is effective;  continue present plan and medications. Continue mtx and folate.  Management per specialist.

## 2022-02-24 NOTE — Assessment & Plan Note (Signed)
Await labs/testing for assessment and recommendations. Recommend continue to work on eating healthy diet and exercise.  

## 2022-02-25 LAB — CBC WITH DIFFERENTIAL/PLATELET
Basophils Absolute: 0 10*3/uL (ref 0.0–0.2)
Basos: 0 %
EOS (ABSOLUTE): 0.1 10*3/uL (ref 0.0–0.4)
Eos: 1 %
Hematocrit: 38.7 % (ref 34.0–46.6)
Hemoglobin: 13 g/dL (ref 11.1–15.9)
Immature Grans (Abs): 0.1 10*3/uL (ref 0.0–0.1)
Immature Granulocytes: 1 %
Lymphocytes Absolute: 1.3 10*3/uL (ref 0.7–3.1)
Lymphs: 15 %
MCH: 29.5 pg (ref 26.6–33.0)
MCHC: 33.6 g/dL (ref 31.5–35.7)
MCV: 88 fL (ref 79–97)
Monocytes Absolute: 0.6 10*3/uL (ref 0.1–0.9)
Monocytes: 7 %
Neutrophils Absolute: 6.6 10*3/uL (ref 1.4–7.0)
Neutrophils: 76 %
Platelets: 349 10*3/uL (ref 150–450)
RBC: 4.4 x10E6/uL (ref 3.77–5.28)
RDW: 12.8 % (ref 11.7–15.4)
WBC: 8.7 10*3/uL (ref 3.4–10.8)

## 2022-02-25 LAB — COMPREHENSIVE METABOLIC PANEL
ALT: 11 IU/L (ref 0–32)
AST: 11 IU/L (ref 0–40)
Albumin/Globulin Ratio: 2.2 (ref 1.2–2.2)
Albumin: 4.3 g/dL (ref 3.9–4.9)
Alkaline Phosphatase: 117 IU/L (ref 44–121)
BUN/Creatinine Ratio: 15 (ref 12–28)
BUN: 10 mg/dL (ref 8–27)
Bilirubin Total: 0.3 mg/dL (ref 0.0–1.2)
CO2: 28 mmol/L (ref 20–29)
Calcium: 9.5 mg/dL (ref 8.7–10.3)
Chloride: 99 mmol/L (ref 96–106)
Creatinine, Ser: 0.65 mg/dL (ref 0.57–1.00)
Globulin, Total: 2 g/dL (ref 1.5–4.5)
Glucose: 119 mg/dL — ABNORMAL HIGH (ref 70–99)
Potassium: 4.1 mmol/L (ref 3.5–5.2)
Sodium: 142 mmol/L (ref 134–144)
Total Protein: 6.3 g/dL (ref 6.0–8.5)
eGFR: 99 mL/min/{1.73_m2} (ref >59)

## 2022-02-25 LAB — TSH: TSH: 0.373 u[IU]/mL — ABNORMAL LOW (ref 0.450–4.500)

## 2022-02-25 LAB — VITAMIN B12: Vitamin B-12: 228 pg/mL — ABNORMAL LOW (ref 232–1245)

## 2022-02-25 LAB — LIPID PANEL
Chol/HDL Ratio: 2.5 ratio (ref 0.0–4.4)
Cholesterol, Total: 131 mg/dL (ref 100–199)
HDL: 53 mg/dL (ref >39)
LDL Chol Calc (NIH): 55 mg/dL (ref 0–99)
Triglycerides: 131 mg/dL (ref 0–149)
VLDL Cholesterol Cal: 23 mg/dL (ref 5–40)

## 2022-02-25 LAB — HEMOGLOBIN A1C
Est. average glucose Bld gHb Est-mCnc: 143 mg/dL
Hgb A1c MFr Bld: 6.6 % — ABNORMAL HIGH (ref 4.8–5.6)

## 2022-02-25 LAB — CARDIOVASCULAR RISK ASSESSMENT

## 2022-02-25 LAB — VITAMIN D 25 HYDROXY (VIT D DEFICIENCY, FRACTURES): Vit D, 25-Hydroxy: 54.6 ng/mL (ref 30.0–100.0)

## 2022-02-25 NOTE — Progress Notes (Signed)
Blood count normal.  Liver function normal.  Kidney function normal.  Thyroid function abnormal. Decrease synthroid to 112 mcg daily. Recheck in 3 months.  Cholesterol: good HBA1C: Improved from 7.2 dow to 6.6. B12 low. Needs b12 shots weekly x 1 month, then continue b12 shot once monthly. Vitamin D normal.

## 2022-02-26 ENCOUNTER — Encounter: Payer: Self-pay | Admitting: Family Medicine

## 2022-02-26 LAB — HM DIABETES EYE EXAM

## 2022-02-27 ENCOUNTER — Other Ambulatory Visit: Payer: Self-pay

## 2022-02-27 MED ORDER — CYANOCOBALAMIN 1000 MCG/ML IJ SOLN: INTRAMUSCULAR | 1 refills | Status: DC

## 2022-02-27 MED ORDER — LEVOTHYROXINE SODIUM 112 MCG PO TABS: 125.0000 ug | ORAL_TABLET | Freq: Every day | ORAL | 0 refills | Status: DC

## 2022-03-09 ENCOUNTER — Other Ambulatory Visit: Payer: Self-pay

## 2022-03-09 ENCOUNTER — Emergency Department (HOSPITAL_COMMUNITY): Payer: BC Managed Care – PPO

## 2022-03-09 ENCOUNTER — Encounter (HOSPITAL_COMMUNITY): Payer: Self-pay | Admitting: General Surgery

## 2022-03-09 ENCOUNTER — Telehealth: Payer: Self-pay | Admitting: *Deleted

## 2022-03-09 ENCOUNTER — Encounter: Payer: Self-pay | Admitting: Family Medicine

## 2022-03-09 ENCOUNTER — Ambulatory Visit: Payer: BC Managed Care – PPO | Admitting: Family Medicine

## 2022-03-09 ENCOUNTER — Inpatient Hospital Stay (HOSPITAL_COMMUNITY): Payer: BC Managed Care – PPO | Admitting: Anesthesiology

## 2022-03-09 ENCOUNTER — Encounter (HOSPITAL_COMMUNITY): Payer: Self-pay

## 2022-03-09 ENCOUNTER — Encounter (HOSPITAL_COMMUNITY)
Admission: EM | Disposition: A | Discharge status: Home / Self Care | Payer: Self-pay | Source: Home / Self Care | Attending: Emergency Medicine

## 2022-03-09 ENCOUNTER — Observation Stay (HOSPITAL_COMMUNITY)
Admission: EM | Admit: 2022-03-09 | Discharge: 2022-03-11 | Disposition: A | Discharge status: Home / Self Care | Payer: BC Managed Care – PPO | Attending: General Surgery | Admitting: General Surgery

## 2022-03-09 VITALS — BP 90/58 | HR 113 | Temp 97.7°F | Resp 15 | Ht 65.0 in | Wt 252.0 lb

## 2022-03-09 DIAGNOSIS — I10 Essential (primary) hypertension: Secondary | ICD-10-CM | POA: Insufficient documentation

## 2022-03-09 DIAGNOSIS — E1165 Type 2 diabetes mellitus with hyperglycemia: Secondary | ICD-10-CM

## 2022-03-09 DIAGNOSIS — R000 Tachycardia, unspecified: Secondary | ICD-10-CM | POA: Insufficient documentation

## 2022-03-09 DIAGNOSIS — N179 Acute kidney failure, unspecified: Secondary | ICD-10-CM | POA: Insufficient documentation

## 2022-03-09 DIAGNOSIS — K611 Rectal abscess: Secondary | ICD-10-CM | POA: Diagnosis not present

## 2022-03-09 DIAGNOSIS — E119 Type 2 diabetes mellitus without complications: Secondary | ICD-10-CM | POA: Insufficient documentation

## 2022-03-09 DIAGNOSIS — K6289 Other specified diseases of anus and rectum: Secondary | ICD-10-CM | POA: Diagnosis not present

## 2022-03-09 DIAGNOSIS — E039 Hypothyroidism, unspecified: Secondary | ICD-10-CM | POA: Insufficient documentation

## 2022-03-09 DIAGNOSIS — Z8542 Personal history of malignant neoplasm of other parts of uterus: Secondary | ICD-10-CM | POA: Diagnosis not present

## 2022-03-09 DIAGNOSIS — R5081 Fever presenting with conditions classified elsewhere: Secondary | ICD-10-CM

## 2022-03-09 DIAGNOSIS — Z79899 Other long term (current) drug therapy: Secondary | ICD-10-CM | POA: Diagnosis not present

## 2022-03-09 DIAGNOSIS — Z96653 Presence of artificial knee joint, bilateral: Secondary | ICD-10-CM | POA: Diagnosis not present

## 2022-03-09 DIAGNOSIS — R509 Fever, unspecified: Secondary | ICD-10-CM | POA: Insufficient documentation

## 2022-03-09 HISTORY — DX: Rectal abscess: K61.1

## 2022-03-09 HISTORY — PX: INCISION AND DRAINAGE PERIRECTAL ABSCESS: SHX1804

## 2022-03-09 LAB — GLUCOSE, CAPILLARY: Glucose-Capillary: 159 mg/dL — ABNORMAL HIGH (ref 70–99)

## 2022-03-09 LAB — CBC WITH DIFFERENTIAL/PLATELET
Abs Immature Granulocytes: 0.07 10*3/uL (ref 0.00–0.07)
Basophils Absolute: 0 10*3/uL (ref 0.0–0.1)
Basophils Relative: 0 %
Eosinophils Absolute: 0 10*3/uL (ref 0.0–0.5)
Eosinophils Relative: 0 %
HCT: 37.5 % (ref 36.0–46.0)
Hemoglobin: 12.6 g/dL (ref 12.0–15.0)
Immature Granulocytes: 0 %
Lymphocytes Relative: 7 %
Lymphs Abs: 1.2 10*3/uL (ref 0.7–4.0)
MCH: 29.4 pg (ref 26.0–34.0)
MCHC: 33.6 g/dL (ref 30.0–36.0)
MCV: 87.6 fL (ref 80.0–100.0)
Monocytes Absolute: 1.4 10*3/uL — ABNORMAL HIGH (ref 0.1–1.0)
Monocytes Relative: 7 %
Neutro Abs: 15.8 10*3/uL — ABNORMAL HIGH (ref 1.7–7.7)
Neutrophils Relative %: 86 %
Platelets: 451 10*3/uL — ABNORMAL HIGH (ref 150–400)
RBC: 4.28 MIL/uL (ref 3.87–5.11)
RDW: 12.8 % (ref 11.5–15.5)
WBC: 18.5 10*3/uL — ABNORMAL HIGH (ref 4.0–10.5)
nRBC: 0 % (ref 0.0–0.2)

## 2022-03-09 LAB — BASIC METABOLIC PANEL
Anion gap: 14 (ref 5–15)
BUN: 20 mg/dL (ref 8–23)
CO2: 24 mmol/L (ref 22–32)
Calcium: 9 mg/dL (ref 8.9–10.3)
Chloride: 95 mmol/L — ABNORMAL LOW (ref 98–111)
Creatinine, Ser: 1.76 mg/dL — ABNORMAL HIGH (ref 0.44–1.00)
GFR, Estimated: 32 mL/min — ABNORMAL LOW (ref >60)
Glucose, Bld: 181 mg/dL — ABNORMAL HIGH (ref 70–99)
Potassium: 3.4 mmol/L — ABNORMAL LOW (ref 3.5–5.1)
Sodium: 133 mmol/L — ABNORMAL LOW (ref 135–145)

## 2022-03-09 LAB — GLUCOSE, POCT (MANUAL RESULT ENTRY): POC Glucose: 176 mg/dl — AB (ref 70–99)

## 2022-03-09 SURGERY — INCISION AND DRAINAGE, ABSCESS, PERIRECTAL
Anesthesia: General | Site: Rectum

## 2022-03-09 MED ORDER — 0.9 % SODIUM CHLORIDE (POUR BTL) OPTIME
TOPICAL | Status: DC | PRN
  Administered 2022-03-09: 1000 mL

## 2022-03-09 MED ORDER — SUCCINYLCHOLINE CHLORIDE 200 MG/10ML IV SOSY
PREFILLED_SYRINGE | INTRAVENOUS | Status: DC | PRN
  Administered 2022-03-09: 120 mg via INTRAVENOUS

## 2022-03-09 MED ORDER — PROMETHAZINE HCL 25 MG/ML IJ SOLN: 6.2500 mg | INTRAMUSCULAR | Status: DC | PRN

## 2022-03-09 MED ORDER — FENTANYL CITRATE (PF) 100 MCG/2ML IJ SOLN
INTRAMUSCULAR | Status: AC
  Filled 2022-03-09: qty 2

## 2022-03-09 MED ORDER — ACETAMINOPHEN 10 MG/ML IV SOLN
INTRAVENOUS | Status: AC
  Filled 2022-03-09: qty 100

## 2022-03-09 MED ORDER — PROPOFOL 500 MG/50ML IV EMUL
INTRAVENOUS | Status: DC | PRN
  Administered 2022-03-09: 50 ug/kg/min via INTRAVENOUS

## 2022-03-09 MED ORDER — DEXAMETHASONE SODIUM PHOSPHATE 10 MG/ML IJ SOLN
INTRAMUSCULAR | Status: DC | PRN
  Administered 2022-03-09: 4 mg via INTRAVENOUS

## 2022-03-09 MED ORDER — LACTATED RINGERS IV SOLN: INTRAVENOUS | Status: DC | PRN

## 2022-03-09 MED ORDER — METRONIDAZOLE 500 MG/100ML IV SOLN
500.0000 mg | Freq: Once | INTRAVENOUS | Status: DC
  Filled 2022-03-09: qty 100

## 2022-03-09 MED ORDER — SODIUM CHLORIDE 0.9 % IV SOLN: Freq: Once | INTRAVENOUS | Status: DC

## 2022-03-09 MED ORDER — ONDANSETRON HCL 4 MG/2ML IJ SOLN
4.0000 mg | Freq: Once | INTRAMUSCULAR | Status: AC
  Administered 2022-03-09: 4 mg via INTRAVENOUS
  Filled 2022-03-09: qty 2

## 2022-03-09 MED ORDER — PHENYLEPHRINE 80 MCG/ML (10ML) SYRINGE FOR IV PUSH (FOR BLOOD PRESSURE SUPPORT)
PREFILLED_SYRINGE | INTRAVENOUS | Status: DC | PRN
  Administered 2022-03-09: 80 ug via INTRAVENOUS
  Administered 2022-03-09 (×3): 120 ug via INTRAVENOUS
  Administered 2022-03-09: 80 ug via INTRAVENOUS

## 2022-03-09 MED ORDER — SODIUM CHLORIDE 0.9 % IV BOLUS
1000.0000 mL | Freq: Once | INTRAVENOUS | Status: AC
  Administered 2022-03-09: 1000 mL via INTRAVENOUS

## 2022-03-09 MED ORDER — PROPOFOL 10 MG/ML IV BOLUS
INTRAVENOUS | Status: DC | PRN
  Administered 2022-03-09: 200 mg via INTRAVENOUS

## 2022-03-09 MED ORDER — ONDANSETRON HCL 4 MG/2ML IJ SOLN
INTRAMUSCULAR | Status: DC | PRN
  Administered 2022-03-09: 4 mg via INTRAVENOUS

## 2022-03-09 MED ORDER — ACETAMINOPHEN 10 MG/ML IV SOLN
INTRAVENOUS | Status: DC | PRN
  Administered 2022-03-09: 1000 mg via INTRAVENOUS

## 2022-03-09 MED ORDER — FENTANYL CITRATE PF 50 MCG/ML IJ SOSY: 25.0000 ug | PREFILLED_SYRINGE | INTRAMUSCULAR | Status: DC | PRN

## 2022-03-09 MED ORDER — OXYCODONE HCL 5 MG/5ML PO SOLN: 5.0000 mg | Freq: Once | ORAL | Status: DC | PRN

## 2022-03-09 MED ORDER — LIDOCAINE 2% (20 MG/ML) 5 ML SYRINGE
INTRAMUSCULAR | Status: DC | PRN
  Administered 2022-03-09: 60 mg via INTRAVENOUS

## 2022-03-09 MED ORDER — OXYCODONE HCL 5 MG PO TABS: 5.0000 mg | ORAL_TABLET | Freq: Once | ORAL | Status: DC | PRN

## 2022-03-09 MED ORDER — MORPHINE SULFATE (PF) 4 MG/ML IV SOLN
4.0000 mg | Freq: Once | INTRAVENOUS | Status: AC
  Administered 2022-03-09: 4 mg via INTRAVENOUS
  Filled 2022-03-09: qty 1

## 2022-03-09 MED ORDER — IOHEXOL 300 MG/ML  SOLN
80.0000 mL | Freq: Once | INTRAMUSCULAR | Status: AC | PRN
  Administered 2022-03-09: 80 mL via INTRAVENOUS

## 2022-03-09 MED ORDER — SODIUM CHLORIDE 0.9 % IV SOLN
2.0000 g | Freq: Once | INTRAVENOUS | Status: AC
  Administered 2022-03-09: 2 g via INTRAVENOUS
  Filled 2022-03-09: qty 20

## 2022-03-09 MED ORDER — FENTANYL CITRATE (PF) 100 MCG/2ML IJ SOLN
INTRAMUSCULAR | Status: DC | PRN
  Administered 2022-03-09 (×2): 25 ug via INTRAVENOUS
  Administered 2022-03-09: 50 ug via INTRAVENOUS

## 2022-03-09 SURGICAL SUPPLY — 33 items
ABDOMINAL PAD ABD IMPLANT
BAG COUNTER SPONGE SURGICOUNT (BAG) IMPLANT
BLADE HEX COATED 2.75 (ELECTRODE) ×1 IMPLANT
BLADE SURG 15 STRL LF DISP TIS (BLADE) ×1 IMPLANT
BLADE SURG 15 STRL SS (BLADE) ×1
COVER SURGICAL LIGHT HANDLE (MISCELLANEOUS) ×1 IMPLANT
ELECT REM PT RETURN 15FT ADLT (MISCELLANEOUS) ×1 IMPLANT
GAUZE 4X4 16PLY ~~LOC~~+RFID DBL (SPONGE) ×1 IMPLANT
GAUZE IODOFORM PACK 1/2 7832 (GAUZE/BANDAGES/DRESSINGS) IMPLANT
GAUZE PACKING IODOFORM 1/2INX (GAUZE/BANDAGES/DRESSINGS) IMPLANT
GAUZE PACKING IODOFORM 1/4X15 (PACKING) IMPLANT
GAUZE PAD ABD 8X10 STRL (GAUZE/BANDAGES/DRESSINGS) IMPLANT
GAUZE SPONGE 4X4 12PLY STRL (GAUZE/BANDAGES/DRESSINGS) ×1 IMPLANT
GAUZE SPONGES 4X4 IMPLANT
GLOVE BIO SURGEON STRL SZ7 (GLOVE) ×2 IMPLANT
GLOVE BIO SURGEON STRL SZ8 (GLOVE) IMPLANT
GLOVE BIOGEL PI IND STRL 7.0 (GLOVE) ×1 IMPLANT
GLOVE BIOGEL PI IND STRL 7.5 (GLOVE) ×2 IMPLANT
GLOVE BIOGEL PI IND STRL 8 (GLOVE) IMPLANT
GOWN STRL REUS W/ TWL XL LVL3 (GOWN DISPOSABLE) ×1 IMPLANT
GOWN STRL REUS W/TWL XL LVL3 (GOWN DISPOSABLE) ×2
KIT BASIN OR (CUSTOM PROCEDURE TRAY) ×1 IMPLANT
KIT TURNOVER KIT A (KITS) IMPLANT
PACK LITHOTOMY IV (CUSTOM PROCEDURE TRAY) ×1 IMPLANT
PACKING GAUZE IODOFORM 1/2INX5 (GAUZE/BANDAGES/DRESSINGS) ×1
PENCIL SMOKE EVACUATOR (MISCELLANEOUS) IMPLANT
SOL PREP POV-IOD 4OZ 10% (MISCELLANEOUS) IMPLANT
SOL SCRUB PVP POV-IOD 4OZ 7.5% (MISCELLANEOUS) ×1
SOLUTION SCRB POV-IOD 4OZ 7.5% (MISCELLANEOUS) ×1 IMPLANT
SURGILUBE 2OZ TUBE FLIPTOP (MISCELLANEOUS) ×1 IMPLANT
TAPE CLOTH SURG 6X10 WHT LF (GAUZE/BANDAGES/DRESSINGS) IMPLANT
TOWEL OR 17X26 10 PK STRL BLUE (TOWEL DISPOSABLE) ×1 IMPLANT
UNDERPAD 30X36 HEAVY ABSORB (UNDERPADS AND DIAPERS) IMPLANT

## 2022-03-09 NOTE — Anesthesia Preprocedure Evaluation (Addendum)
Anesthesia Evaluation  Patient identified by MRN, date of birth, ID band Patient awake    Reviewed: Allergy & Precautions, NPO status , Patient's Chart, lab work & pertinent test results  History of Anesthesia Complications (+) PONV and history of anesthetic complications  Airway Mallampati: II  TM Distance: >3 FB Neck ROM: Full    Dental  (+) Dental Advisory Given, Teeth Intact   Pulmonary sleep apnea ,    Pulmonary exam normal        Cardiovascular hypertension, Pt. on medications Normal cardiovascular exam     Neuro/Psych negative neurological ROS  negative psych ROS   GI/Hepatic Neg liver ROS, GERD  Controlled, Ozempic, last dose 5 days ago    Endo/Other  diabetes, Type 2Hypothyroidism Morbid obesity  Renal/GU Renal InsufficiencyRenal disease Bladder dysfunction      Musculoskeletal  (+) Arthritis , Osteoarthritis and Rheumatoid disorders,    Abdominal   Peds  Hematology negative hematology ROS (+)   Anesthesia Other Findings   Reproductive/Obstetrics                            Anesthesia Physical Anesthesia Plan  ASA: 3 and emergent  Anesthesia Plan: General   Post-op Pain Management: Ofirmev IV (intra-op)*   Induction: Intravenous, Rapid sequence and Cricoid pressure planned  PONV Risk Score and Plan: 4 or greater and Treatment may vary due to age or medical condition, Ondansetron, Dexamethasone and Midazolam  Airway Management Planned: Oral ETT  Additional Equipment: None  Intra-op Plan:   Post-operative Plan: Extubation in OR  Informed Consent: I have reviewed the patients History and Physical, chart, labs and discussed the procedure including the risks, benefits and alternatives for the proposed anesthesia with the patient or authorized representative who has indicated his/her understanding and acceptance.     Dental advisory given  Plan Discussed with: CRNA  and Anesthesiologist  Anesthesia Plan Comments:        Anesthesia Quick Evaluation

## 2022-03-09 NOTE — Assessment & Plan Note (Signed)
Concerning for possible systemic infection or dehydration.

## 2022-03-09 NOTE — Telephone Encounter (Signed)
Patient informed. 

## 2022-03-09 NOTE — ED Provider Notes (Signed)
[facility] DEPT Provider Note   CSN: [id number] Arrival date & time: 03/09/22  1725     History  Chief Complaint  Patient presents with   Chills    Kathleen Russo is a 61 y.o. female.  61 year old female with prior medical history detailed below presents for evaluation.  Patient complains of worsening pain to the rectum.  Patient reports prior history of rectal abscess.  Patient reports symptoms began over the last 3 to 5 days.  She reports temperatures at home up to 101.  She reports nausea.  She reports decreased p.o. intake over the last 2 to 3 days.  The history is provided by the patient and medical records.       Home Medications Prior to Admission medications   Medication Sig Start Date End Date Taking? Authorizing Provider  cyclobenzaprine (FLEXERIL) 10 MG tablet Take 10 mg by mouth every 6 (six) hours as needed for muscle spasms. 01/20/22  Yes [provider]  fluticasone (FLONASE) 50 MCG/ACT nasal spray Place 2 sprays into both nostrils daily. Patient taking differently: Place 2 sprays into both nostrils daily as needed for allergies. 07/28/21  Yes Rip Harbour, NP  folic acid (FOLVITE) 1 MG tablet Take 1 tablet (1 mg total) by mouth 2 (two) times daily. Patient taking differently: Take 2 mg by mouth daily. 10/28/20  Yes Cox, Kirsten, MD  HYDROcodone-acetaminophen (NORCO/VICODIN) 5-325 MG tablet Take 1 tablet by mouth every 6 (six) hours as needed for moderate pain or severe pain. 01/26/22  Yes [provider]  levothyroxine (SYNTHROID) 112 MCG tablet Take 1 tablet (112 mcg total) by mouth daily before breakfast. 02/27/22  Yes Cox, Kirsten, MD  lisinopril-hydrochlorothiazide (ZESTORETIC) 10-12.5 MG tablet TAKE ONE (1) TABLET BY MOUTH ONCE DAILY 09/30/21  Yes Cox, Kirsten, MD  loratadine (CLARITIN) 10 MG tablet Take 1 tablet (10 mg total) by mouth daily. Patient taking differently: Take 10 mg by mouth daily as needed for  allergies. 08/18/21  Yes Rip Harbour, NP  Methotrexate 25 MG/ML SOSY Inject 0.7 mLs into the skin once a week. METHOTREXATE 25MG /ML MDV 2ML   Yes [provider]  montelukast (SINGULAIR) 10 MG tablet Take 1 tablet (10 mg total) by mouth at bedtime. Patient taking differently: Take 10 mg by mouth in the morning. 07/28/21  Yes Rip Harbour, NP  ondansetron (ZOFRAN) 4 MG tablet Take 4 mg by mouth every 8 (eight) hours as needed for nausea or vomiting. 01/15/21  Yes [provider]  OZEMPIC, 2 MG/DOSE, 8 MG/3ML SOPN INJECT 2 MG UNDER THE SKIN ONCE A WEEK Patient taking differently: Inject 2 mg into the skin once a week. 12/29/21  Yes Cox, Kirsten, MD  polyethylene glycol (MIRALAX / GLYCOLAX) 17 g packet Take 17 g by mouth every other day.   Yes [provider]  rosuvastatin (CRESTOR) 10 MG tablet TAKE 1 TABLET BY MOUTH EVERY DAY 12/10/21  Yes Cox, Kirsten, MD  Vitamin D, Ergocalciferol, (DRISDOL) 1.25 MG (50000 UNIT) CAPS capsule TAKE 1 CAPSULE BY MOUTH EVERY 7 DAYS 01/12/22  Yes Cox, Kirsten, MD  blood glucose meter kit and supplies KIT Dispense based on patient and insurance preference. Check daily in am. E11.69. 09/26/19   Cox, Elnita Maxwell, MD  clobetasol ointment (TEMOVATE) 0.97 % Apply 1 application topically 2 (two) times daily. Patient not taking: Reported on 03/09/2022 10/23/20   CoxElnita Maxwell, MD  cyanocobalamin (VITAMIN B12) 1000 MCG/ML injection Inject 1 ml weekly for 4 weeks  then 1 ml every 4 weeks. 02/27/22   Cox, Elnita Maxwell, MD  furosemide (LASIX) 20 MG tablet Take 1 tablet (20 mg total) by mouth daily as needed for edema. Patient not taking: Reported on 03/09/2022 08/20/21   Rip Harbour, NP  nystatin (MYCOSTATIN/NYSTOP) powder Apply 1 application. topically 3 (three) times daily. Apply to affected area for up to 7 days Patient not taking: Reported on 03/09/2022 08/28/21   Nunzio Cobbs, MD  [facility] ULTRA test strip USE TO CHECK BLOOD SUGAR ONCE DAILY IN THE  MORNING 02/09/20   Rochel Brome, MD      Allergies    Quinolones and Tolmetin    Review of Systems   Review of Systems  All other systems reviewed and are negative.   Physical Exam Updated Vital Signs BP (!) 101/59   Pulse 83   Temp 98.6 F (37 C) (Oral)   Resp 17   SpO2 97%  Physical Exam Vitals and nursing note reviewed.  Constitutional:      General: She is not in acute distress.    Appearance: Normal appearance. She is well-developed.  HENT:     Head: Normocephalic and atraumatic.  Eyes:     Conjunctiva/sclera: Conjunctivae normal.     Pupils: Pupils are equal, round, and reactive to light.  Cardiovascular:     Rate and Rhythm: Normal rate and regular rhythm.     Heart sounds: Normal heart sounds.  Pulmonary:     Effort: Pulmonary effort is normal. No respiratory distress.     Breath sounds: Normal breath sounds.  Abdominal:     General: There is no distension.     Palpations: Abdomen is soft.     Tenderness: There is no abdominal tenderness.  Genitourinary:    Comments: No discrete Rectal fluctuance or swelling noted.  Patient declines digital rectal exam secondary to pain. Musculoskeletal:        General: No deformity. Normal range of motion.     Cervical back: Normal range of motion and neck supple.  Skin:    General: Skin is warm and dry.  Neurological:     General: No focal deficit present.     Mental Status: She is alert and oriented to person, place, and time.     ED Results / Procedures / Treatments   Labs (all labs ordered are listed, but only abnormal results are displayed) Labs Reviewed  BASIC METABOLIC PANEL - Abnormal; Notable for the following components:      Result Value   Sodium 133 (*)    Potassium 3.4 (*)    Chloride 95 (*)    Glucose, Bld 181 (*)    Creatinine, Ser 1.76 (*)    GFR, Estimated 32 (*)    All other components within normal limits  CBC WITH DIFFERENTIAL/PLATELET - Abnormal; Notable for the following components:    WBC 18.5 (*)    Platelets 451 (*)    Neutro Abs 15.8 (*)    Monocytes Absolute 1.4 (*)    All other components within normal limits  URINALYSIS, ROUTINE W REFLEX MICROSCOPIC    EKG None  Radiology CT ABDOMEN PELVIS W CONTRAST  Result Date: 03/09/2022 CLINICAL DATA:  Abdominal pain. Rectal pain. Concern for abscess or infection. EXAM: CT ABDOMEN AND PELVIS WITH CONTRAST TECHNIQUE: Multidetector CT imaging of the abdomen and pelvis was performed using the standard protocol following bolus administration of intravenous contrast. RADIATION DOSE REDUCTION: This exam was performed according to the departmental dose-optimization  program which includes automated exposure control, adjustment of the mA and/or kV according to patient size and/or use of iterative reconstruction technique. CONTRAST:  15mL OMNIPAQUE IOHEXOL 300 MG/ML  SOLN COMPARISON:  None Available. FINDINGS: Evaluation is limited due to streak artifact caused by patient's arms as well as left hip arthroplasty. Lower chest: The visualized lung bases are clear. No intra-abdominal free air or free fluid. Hepatobiliary: Probable mild fatty liver. No BD dilatation. There is gallstone. No pericholecystic fluid or evidence of acute cholecystitis by CT. Pancreas: Unremarkable. No pancreatic ductal dilatation or surrounding inflammatory changes. Spleen: Normal in size without focal abnormality. Adrenals/Urinary Tract: The adrenal glands are unremarkable. There is a 4.5 cm slightly lobulated left renal inferior pole cyst. Several bilateral small parapelvic cysts. There is no hydronephrosis on either side. The visualized ureters appear unremarkable. The urinary bladder is collapsed and suboptimally visualized. Stomach/Bowel: Postsurgical changes of gastric bypass. There is no bowel obstruction or active inflammation. There is sigmoid diverticulosis without active inflammatory changes. The appendix is normal. Vascular/Lymphatic: Moderate aortoiliac  atherosclerotic disease. The IVC is unremarkable. No portal venous gas. There is no adenopathy. Reproductive: Hysterectomy.  No adnexal masses. Other: There is a 6.6 x 3.7 cm complex collection containing small pockets of air to the right of the anus. There is associated mild mass effect on the rectum. This is most consistent with an abscess. Underlying mass or neoplasm is not excluded. Clinical correlation recommended. Musculoskeletal: Total left hip arthroplasty. Osteopenia with degenerative changes of the spine. Bilateral L5 pars defects with grade 1 L5-S1 anterolisthesis. No acute osseous pathology. IMPRESSION: 1. Right perianal abscess. 2. Sigmoid diverticulosis. No bowel obstruction. Normal appendix. 3. Cholelithiasis. 4. Aortic Atherosclerosis (ICD10-I70.0). Electronically Signed   By: Anner Crete M.D.   On: 03/09/2022 21:32    Procedures Procedures    Medications Ordered in ED Medications  cefTRIAXone (ROCEPHIN) 2 g in sodium chloride 0.9 % 100 mL IVPB (2 g Intravenous New Bag/Given 03/09/22 2204)    And  metroNIDAZOLE (FLAGYL) IVPB 500 mg (has no administration in time range)  0.9 %  sodium chloride infusion (has no administration in time range)  sodium chloride 0.9 % bolus 1,000 mL (0 mLs Intravenous Stopped 03/09/22 2153)  ondansetron (ZOFRAN) injection 4 mg (4 mg Intravenous Given 03/09/22 2100)  morphine (PF) 4 MG/ML injection 4 mg (4 mg Intravenous Given 03/09/22 2101)  iohexol (OMNIPAQUE) 300 MG/ML solution 80 mL (80 mLs Intravenous Contrast Given 03/09/22 2116)    ED Course/ Medical Decision Making/ A&P                           Medical Decision Making Amount and/or Complexity of Data Reviewed Radiology: ordered.  Risk Prescription drug management. Decision regarding hospitalization.    Medical Screen Complete  This patient presented to the ED with complaint of rectal pain, fever.  This complaint involves an extensive number of treatment options. The initial  differential diagnosis includes, but is not limited to, perirectal abscess, rectal abscess, metabolic abnormality, etc.  This presentation is: Acute, Self-Limited, Previously Undiagnosed, Uncertain Prognosis, Complicated, Systemic Symptoms, and Threat to Life/Bodily Function  Patient is presenting with complaint of rectal pain, fever, nausea.  Presentation is concerning for possible rectal abscess or infection.  CT imaging reveals evidence of rectal abscess. Patient with laboratory evidence of AKI.  Case discussed with Dr. Donne Hazel.  Dr.Wakefield will evaluate in the ED.   Additional history obtained:  External records from outside sources  obtained and reviewed including prior ED visits and prior Inpatient records.    Lab Tests:  I ordered and personally interpreted labs.  The pertinent results include: CBC, BMP, UA   Imaging Studies ordered:  I ordered imaging studies including CT abdomen pelvis I independently visualized and interpreted obtained imaging which showed rectal abscess I agree with the radiologist interpretation.   Cardiac Monitoring:  The patient was maintained on a cardiac monitor.  I personally viewed and interpreted the cardiac monitor which showed an underlying rhythm of: NSR   Medicines ordered:  I ordered medication including morphine, Zofran, IV fluids, antibiotics for perirectal abscess Reevaluation of the patient after these medicines showed that the patient: stayed the same   Problem List / ED Course:  Perirectal abscess   Reevaluation:  After the interventions noted above, I reevaluated the patient and found that they have: stayed the same   Disposition:  After consideration of the diagnostic results and the patients response to treatment, I feel that the patent would benefit from admission.          Final Clinical Impression(s) / ED Diagnoses Final diagnoses:  Rectal abscess  AKI (acute kidney injury) [facility])    Rx / DC  Orders ED Discharge Orders     None         Valarie Merino, MD 03/09/22 2210

## 2022-03-09 NOTE — Telephone Encounter (Signed)
I recommend she see her PCP please.

## 2022-03-09 NOTE — Assessment & Plan Note (Signed)
Sugar 176 today.

## 2022-03-09 NOTE — Op Note (Signed)
Preoperative diagnosis: Perirectal abscess Postoperative diagnosis: Same as above Procedure: Incision and drainage of perirectal abscess Surgeon: Dr. Serita Grammes Anesthesia: General Estimated blood loss: Minimal Complications: None Drains: None Specimens: None Sponge needle count was correct completion Disposition recovery stable condition  Indications: This is 61 year old female with perirectal pain who presents with an elevated white blood cell count and some what appeared to be fluctuance on her exam and some tenderness.  She had a CT scan that showed a perianal abscess.  I discussed going to the operating room.  Procedure: After informed consent was obtained she was given antibiotics.  SCDs were in place.  She was taken to the operative and placed under general anesthesia without complication.  She was placed in the prone position and placed in stirrups and appropriately padded.  She was then prepped and draped in a sterile sterile surgical fashion.  Surgical timeout was then performed.  On exam I thought that I could feel this area preoperatively.  This ends up was just her habitus on the side.  She a lot of excess tissue and this was hard on the right side.  I did a rectal exam and I could feel the actual abscess impinging upon the rectum really at the longest extent of my finger.  I then made a stab incision to the right of the anus.  I then used a Kelly clamp as well as my finger to open the space.  The skin came apart some with this making the incision little bit bigger.  Eventually at the end of my index finger I was able to enter into a cavity that produce purulence.  This was completely drained.  I then irrigated copiously.  I then packed this with iodoform gauze.  Dressings were placed.  She tolerated this well was extubated and transferred to recovery stable.

## 2022-03-09 NOTE — Assessment & Plan Note (Addendum)
Concerned about intraabdominal/pelvic abscess. Not clear rectal abscess. Referred to ED. Spoke with Camera operator at [employer].

## 2022-03-09 NOTE — Anesthesia Procedure Notes (Signed)
Procedure Name: Intubation Date/Time: 03/09/2022 11:13 PM  Performed by: Milford Cage, CRNAPre-anesthesia Checklist: Patient identified, Emergency Drugs available, Suction available and Patient being monitored Patient Re-evaluated:Patient Re-evaluated prior to induction Oxygen Delivery Method: Circle system utilized Preoxygenation: Pre-oxygenation with 100% oxygen Induction Type: IV induction, Rapid sequence and Cricoid Pressure applied Laryngoscope Size: Miller and 2 Grade View: Grade I Tube type: Oral Tube size: 7.0 mm Number of attempts: 1 Airway Equipment and Method: Stylet Placement Confirmation: ETT inserted through vocal cords under direct vision, positive ETCO2 and breath sounds checked- equal and bilateral Secured at: 20 cm Tube secured with: Tape Dental Injury: Teeth and Oropharynx as per pre-operative assessment

## 2022-03-09 NOTE — Progress Notes (Signed)
Acute Office Visit  Subjective:    Patient ID: Kathleen Russo, female    DOB: [birth year], 61 y.o.   MRN: [id number]  Chief Complaint  Patient presents with   Abscess    HPI: Patient is a 61 yo WF with pmhx of diabetes, hypertension, mixed hyperlipidemia, RA, hypothyroidism, b12 and vitamin D deficiencies who presents in our office with concern for possible rectal abscess on anal area since  3 days ago. She has chills, fever 101, headache, nauseas and dizziness. She is sore in the area when she sits down. She denies blood in the stool. She took tylenol for the fever and it helps. She mentioned that 8 years ago, she has the same problem and Gynecologist drainage in the time.  Past Medical History:  Diagnosis Date   Anemia    Arthritis    Cancer (HCC)    UTERINE   Diabetes mellitus    DM2, not requiring meds as of 04/2011   Dizziness    GERD (gastroesophageal reflux disease)    Hemorrhoids    Hyperlipidemia    Hypertension    not requiring meds as of 04/2011   Hypothyroidism    Lichen sclerosus    PONV (postoperative nausea and vomiting)    Primary osteoarthritis    Sleep apnea    USES CPAP   Swelling of both ankles    Type 2 diabetes mellitus (HCC)     Past Surgical History:  Procedure Laterality Date   ABDOMINAL HYSTERECTOMY     BARIATRIC SURGERY     BREAST LUMPECTOMY Left    BREAST SURGERY  1979   ELBOW SURGERY  1990'S   GASTRIC BYPASS  2007   JOINT REPLACEMENT  2002   RT. KNEE   KNEE ARTHROSCOPY  1993   LT. KNEE   KNEE ARTHROSCOPY W/ LATERAL RELEASE  1987   RT. KNEE   left hip replacement  05/2017   SHOULDER SURGERY Right 01/20/2022   rotator cuff repair   TONSILLECTOMY     TOTAL KNEE ARTHROPLASTY  07/06/2011   Procedure: TOTAL KNEE ARTHROPLASTY;  Surgeon: Raymon Mutton, MD;  Location: [facility] OR;  Service: Orthopedics;  Laterality: Left;    Family History  Problem Relation Age of Onset   Hypertension Mother    Thyroid disease Mother    Diabetes  Mother    Other Father        Wegoners disease   Thyroid disease Sister    Diabetes Sister    Graves' disease Brother     Social History   Socioeconomic History   Marital status: Married    Spouse name: Not on file   Number of children: Not on file   Years of education: Not on file   Highest education level: Not on file  Occupational History   Occupation: retired    Comment: [employer]  Tobacco Use   Smoking status: Never   Smokeless tobacco: Never  Vaping Use   Vaping Use: Never used  Substance and Sexual Activity   Alcohol use: No   Drug use: No   Sexual activity: Yes    Partners: Male    Birth control/protection: Surgical    Comment: hysterectomy  Other Topics Concern   Not on file  Social History Narrative   Not on file   Social Determinants of Health   Financial Resource Strain: Not on file  Food Insecurity: Not on file  Transportation Needs: Not on file  Physical Activity: Not on file  Stress: Not on file  Social Connections: Not on file  Intimate Partner Violence: Not on file    Outpatient Medications Prior to Visit  Medication Sig Dispense Refill   blood glucose meter kit and supplies KIT Dispense based on patient and insurance preference. Check daily in am. E11.69. 1 each 0   clobetasol ointment (TEMOVATE) 0.05 % Apply 1 application topically 2 (two) times daily. 60 g 1   cyanocobalamin (VITAMIN B12) 1000 MCG/ML injection Inject 1 ml weekly for 4 weeks then 1 ml every 4 weeks. 10 mL 1   cyclobenzaprine (FLEXERIL) 10 MG tablet Take 10 mg by mouth every 6 (six) hours as needed.     etodolac (LODINE) 400 MG tablet Take 400 mg by mouth 2 (two) times daily.     fluticasone (FLONASE) 50 MCG/ACT nasal spray Place 2 sprays into both nostrils daily. 16 g 6   folic acid (FOLVITE) 1 MG tablet Take 1 tablet (1 mg total) by mouth 2 (two) times daily. 180 tablet 3   furosemide (LASIX) 20 MG tablet Take 1 tablet (20 mg total) by mouth daily as needed for edema. 90 tablet 3    HYDROcodone-acetaminophen (NORCO/VICODIN) 5-325 MG tablet Take 1 tablet by mouth every 6 (six) hours as needed.     levothyroxine (SYNTHROID) 112 MCG tablet Take 1 tablet (112 mcg total) by mouth daily before breakfast. 90 tablet 0   lisinopril-hydrochlorothiazide (ZESTORETIC) 10-12.5 MG tablet TAKE ONE (1) TABLET BY MOUTH ONCE DAILY 90 tablet 1   loratadine (CLARITIN) 10 MG tablet Take 1 tablet (10 mg total) by mouth daily. 90 tablet 2   methotrexate 250 MG/10ML injection SMARTSIG:0.7 Milliliter(s) SUB-Q Once a Week     montelukast (SINGULAIR) 10 MG tablet Take 1 tablet (10 mg total) by mouth at bedtime. 90 tablet 3   nystatin (MYCOSTATIN/NYSTOP) powder Apply 1 application. topically 3 (three) times daily. Apply to affected area for up to 7 days 30 g 2   ondansetron (ZOFRAN) 4 MG tablet Take 4 mg by mouth daily as needed.     ONETOUCH ULTRA test strip USE TO CHECK BLOOD SUGAR ONCE DAILY IN THE MORNING 50 each 1   OZEMPIC, 2 MG/DOSE, 8 MG/3ML SOPN INJECT 2 MG UNDER THE SKIN ONCE A WEEK 9 mL 0   polyethylene glycol (MIRALAX / GLYCOLAX) 17 g packet Take 17 g by mouth daily.     rosuvastatin (CRESTOR) 10 MG tablet TAKE 1 TABLET BY MOUTH EVERY DAY 90 tablet 0   Vitamin D, Ergocalciferol, (DRISDOL) 1.25 MG (50000 UNIT) CAPS capsule TAKE 1 CAPSULE BY MOUTH EVERY 7 DAYS 12 capsule 0   zinc gluconate 50 MG tablet Take 50 mg by mouth daily.     Ascorbic Acid (VITAMIN C) 1000 MG tablet Take 1,000 mg by mouth daily.     cyclobenzaprine (FLEXERIL) 5 MG tablet Take 1 tablet (5 mg total) by mouth 3 (three) times daily as needed. 90 tablet 1   No facility-administered medications prior to visit.    Allergies  Allergen Reactions   Quinolones     Medications don't absorb   Tolmetin Other (See Comments)    THIS MEDICATION WILL NOT ABSORB D/T GBP Medications don't absorb     Review of Systems  Constitutional:  Positive for fever. Negative for chills and fatigue.  HENT:  Negative for congestion, ear  pain and sore throat.   Respiratory:  Negative for cough and shortness of breath.   Cardiovascular:  Negative for chest pain and  palpitations.  Gastrointestinal:  Negative for abdominal pain, constipation, diarrhea, nausea and vomiting.  Endocrine: Negative for polydipsia, polyphagia and polyuria.  Genitourinary:  Negative for difficulty urinating and dysuria.       Abscess on anal area.  Musculoskeletal:  Negative for arthralgias, back pain and myalgias.  Skin:  Positive for color change. Negative for rash.  Neurological:  Positive for light-headedness and headaches.  Psychiatric/Behavioral:  Negative for dysphoric mood. The patient is not nervous/anxious.        Objective:    Physical Exam Vitals reviewed.  Constitutional:      Appearance: She is ill-appearing.     Comments: Pale, diaphoretic.   Cardiovascular:     Rate and Rhythm: Regular rhythm. Tachycardia present.     Heart sounds: Normal heart sounds.  Pulmonary:     Effort: Pulmonary effort is normal.     Breath sounds: Normal breath sounds.  Abdominal:     General: Bowel sounds are normal.     Palpations: Abdomen is soft.     Tenderness: There is no abdominal tenderness.     Comments: Rectal exam had fullness and tenderness very high up in rectum. No fluctuance felt.  Neurological:     Mental Status: She is alert.     BP (!) 90/58   Pulse (!) 113   Temp 97.7 F (36.5 C)   Resp 15   Ht 5\' 5"  (1.651 m)   Wt 252 lb (114.3 kg)   SpO2 97%   BMI 41.93 kg/m  Wt Readings from Last 3 Encounters:  03/09/22 252 lb (114.3 kg)  02/24/22 255 lb (115.7 kg)  11/19/21 263 lb (119.3 kg)    Health Maintenance Due  Topic Date Due   Zoster Vaccines- Shingrix (1 of 2) Never done   INFLUENZA VACCINE  01/20/2022   MAMMOGRAM  02/21/2022    There are no preventive care reminders to display for this patient.   Lab Results  Component Value Date   TSH 0.373 (L) 02/24/2022   Lab Results  Component Value Date   WBC 8.7  02/24/2022   HGB 13.0 02/24/2022   HCT 38.7 02/24/2022   MCV 88 02/24/2022   PLT 349 02/24/2022   Lab Results  Component Value Date   NA 142 02/24/2022   K 4.1 02/24/2022   CO2 28 02/24/2022   GLUCOSE 119 (H) 02/24/2022   BUN 10 02/24/2022   CREATININE 0.65 02/24/2022   BILITOT 0.3 02/24/2022   ALKPHOS 117 02/24/2022   AST 11 02/24/2022   ALT 11 02/24/2022   PROT 6.3 02/24/2022   ALBUMIN 4.3 02/24/2022   CALCIUM 9.5 02/24/2022   EGFR 99 02/24/2022   Lab Results  Component Value Date   CHOL 131 02/24/2022   Lab Results  Component Value Date   HDL 53 02/24/2022   Lab Results  Component Value Date   LDLCALC 55 02/24/2022   Lab Results  Component Value Date   TRIG 131 02/24/2022   Lab Results  Component Value Date   CHOLHDL 2.5 02/24/2022   Lab Results  Component Value Date   HGBA1C 6.6 (H) 02/24/2022       Assessment & Plan:   Problem List Items Addressed This Visit       Endocrine   Type 2 diabetes mellitus with hyperglycemia, without long-term current use of insulin (HCC)    Sugar 176 today.       Relevant Orders   Glucose (CBG) (Completed)     Other  Rectal pain - Primary    Concerned about intraabdominal/pelvic abscess. Not clear rectal abscess. Referred to ED. Spoke with Press photographer at [employer].       Tachycardia    Concerning for possible systemic infection or dehydration.      Fever    Concerning for possible systemic infection      No orders of the defined types were placed in this encounter.   Orders Placed This Encounter  Procedures   Glucose (CBG)    Total time spent on today's visit was greater than 30 minutes, including both face-to-face time and nonface-to-face time personally spent on review of chart (labs and imaging), discussing labs and goals, discussing further work-up, treatment options, referrals to specialist if needed, reviewing outside records of pertinent, answering patient's questions, and coordinating  care.  Follow-up: Return for after ED/Hospital visit.  An After Visit Summary was printed and given to the patient.  Blane Ohara, MD [facility] [phone]

## 2022-03-09 NOTE — ED Provider Triage Note (Signed)
Emergency Medicine Provider Triage Evaluation Note  Kathleen Russo , a 60 y.o. female  was evaluated in triage.  Pt complains of worsening pain in/around the rectum.  States this feels similar to when she had a rectal abscess in the past.  Had noticed becoming more constipated over the last 1 to 2 weeks, tried taking some MiraLAX with some success.  Then started noticing the pain about 3 days ago.  Endorses some decreased appetite and fever with chills, highest temperature of 101.0, however denies diarrhea, or N/V.  Has not seen any blood or discharge when she wipes.  Review of Systems  Positive:  Negative: See above  Physical Exam  BP 107/73 (BP Location: Left Arm)   Pulse (!) 108   Temp 98.8 F (37.1 C) (Oral)   Resp 18   SpO2 99%  Gen:   Awake, no distress   Resp:  Normal effort  MSK:   Moves extremities without difficulty   Medical Decision Making  Medically screening exam initiated at 5:50 PM.  Appropriate orders placed.  Kathleen Russo was informed that the remainder of the evaluation will be completed by another provider, this initial triage assessment does not replace that evaluation, and the importance of remaining in the ED until their evaluation is complete.     Prince Rome, PA-C 47/[id number]/95 [id number]

## 2022-03-09 NOTE — Assessment & Plan Note (Signed)
Concerning for possible systemic infection

## 2022-03-09 NOTE — H&P (Signed)
Kathleen Russo is an 61 y.o. female.   Chief Complaint: rectal pain HPI: 36 yof with several days of perirectal pain. She noted constipation recently that didn't resolve . This was followed by pain. Not taking much po. Has some nausea.  Had temp to 101 at home. She has had abscess before 8-10 years ago that spontaneously drained.  Is UTD on csc she states.  She came to er as was not getting better.  She was found to have wbc of 18 and a ct scan that shows a perirectal abscess. I was asked to see her.  She has recent right RC surgery and is in a sling. Not supposed to be moving shoulder much still   Past Medical History:  Diagnosis Date   Anemia    Arthritis    Cancer ([place])    UTERINE   Diabetes mellitus    DM2, not requiring meds as of 04/2011   Dizziness    GERD (gastroesophageal reflux disease)    Hemorrhoids    Hyperlipidemia    Hypertension    not requiring meds as of 04/2011   Hypothyroidism    Lichen sclerosus    PONV (postoperative nausea and vomiting)    Primary osteoarthritis    Sleep apnea    USES CPAP   Swelling of both ankles    Type 2 diabetes mellitus ([place])     Past Surgical History:  Procedure Laterality Date   ABDOMINAL HYSTERECTOMY     BARIATRIC SURGERY     BREAST LUMPECTOMY Left    BREAST SURGERY  1979   ELBOW SURGERY  1990'S   GASTRIC BYPASS  2007   JOINT REPLACEMENT  2002   RT. KNEE   KNEE ARTHROSCOPY  1993   LT. KNEE   KNEE ARTHROSCOPY W/ LATERAL RELEASE  1987   RT. KNEE   left hip replacement  05/2017   SHOULDER SURGERY Right 01/20/2022   rotator cuff repair   TONSILLECTOMY     TOTAL KNEE ARTHROPLASTY  07/06/2011   Procedure: TOTAL KNEE ARTHROPLASTY;  Surgeon: Rudean Haskell, MD;  Location: [facility];  Service: Orthopedics;  Laterality: Left;    Family History  Problem Relation Age of Onset   Hypertension Mother    Thyroid disease Mother    Diabetes Mother    Other Father        Wegoners disease   Thyroid disease Sister    Diabetes  Sister    Berenice Primas' disease Brother    Social History:  reports that she has never smoked. She has never used smokeless tobacco. She reports that she does not drink alcohol and does not use drugs.  Allergies:  Allergies  Allergen Reactions   Quinolones     Medications don't absorb   Tolmetin Other (See Comments)    THIS MEDICATION WILL NOT ABSORB D/T GBP Medications don't absorb     No current facility-administered medications on file prior to encounter.   Current Outpatient Medications on File Prior to Encounter  Medication Sig Dispense Refill   cyclobenzaprine (FLEXERIL) 10 MG tablet Take 10 mg by mouth every 6 (six) hours as needed for muscle spasms.     fluticasone (FLONASE) 50 MCG/ACT nasal spray Place 2 sprays into both nostrils daily. (Patient taking differently: Place 2 sprays into both nostrils daily as needed for allergies.) 16 g 6   folic acid (FOLVITE) 1 MG tablet Take 1 tablet (1 mg total) by mouth 2 (two) times daily. (Patient taking differently: Take 2  mg by mouth daily.) 180 tablet 3   HYDROcodone-acetaminophen (NORCO/VICODIN) 5-325 MG tablet Take 1 tablet by mouth every 6 (six) hours as needed for moderate pain or severe pain.     levothyroxine (SYNTHROID) 112 MCG tablet Take 1 tablet (112 mcg total) by mouth daily before breakfast. 90 tablet 0   lisinopril-hydrochlorothiazide (ZESTORETIC) 10-12.5 MG tablet TAKE ONE (1) TABLET BY MOUTH ONCE DAILY 90 tablet 1   loratadine (CLARITIN) 10 MG tablet Take 1 tablet (10 mg total) by mouth daily. (Patient taking differently: Take 10 mg by mouth daily as needed for allergies.) 90 tablet 2   Methotrexate 25 MG/ML SOSY Inject 0.7 mLs into the skin once a week. METHOTREXATE 25MG /ML MDV 2ML     montelukast (SINGULAIR) 10 MG tablet Take 1 tablet (10 mg total) by mouth at bedtime. (Patient taking differently: Take 10 mg by mouth in the morning.) 90 tablet 3   ondansetron (ZOFRAN) 4 MG tablet Take 4 mg by mouth every 8 (eight) hours as  needed for nausea or vomiting.     OZEMPIC, 2 MG/DOSE, 8 MG/3ML SOPN INJECT 2 MG UNDER THE SKIN ONCE A WEEK (Patient taking differently: Inject 2 mg into the skin once a week.) 9 mL 0   polyethylene glycol (MIRALAX / GLYCOLAX) 17 g packet Take 17 g by mouth every other day.     rosuvastatin (CRESTOR) 10 MG tablet TAKE 1 TABLET BY MOUTH EVERY DAY 90 tablet 0   Vitamin D, Ergocalciferol, (DRISDOL) 1.25 MG (50000 UNIT) CAPS capsule TAKE 1 CAPSULE BY MOUTH EVERY 7 DAYS 12 capsule 0   blood glucose meter kit and supplies KIT Dispense based on patient and insurance preference. Check daily in am. E11.69. 1 each 0   clobetasol ointment (TEMOVATE) 7.89 % Apply 1 application topically 2 (two) times daily. (Patient not taking: Reported on 03/09/2022) 60 g 1   cyanocobalamin (VITAMIN B12) 1000 MCG/ML injection Inject 1 ml weekly for 4 weeks then 1 ml every 4 weeks. 10 mL 1   furosemide (LASIX) 20 MG tablet Take 1 tablet (20 mg total) by mouth daily as needed for edema. (Patient not taking: Reported on 03/09/2022) 90 tablet 3   nystatin (MYCOSTATIN/NYSTOP) powder Apply 1 application. topically 3 (three) times daily. Apply to affected area for up to 7 days (Patient not taking: Reported on 03/09/2022) 30 g 2   ONETOUCH ULTRA test strip USE TO CHECK BLOOD SUGAR ONCE DAILY IN THE MORNING 50 each 1      Results for orders placed or performed during the hospital encounter of 03/09/22 (from the past 48 hour(s))  Basic metabolic panel     Status: Abnormal   Collection Time: 03/09/22  5:59 PM  Result Value Ref Range   Sodium 133 (L) 135 - 145 mmol/L   Potassium 3.4 (L) 3.5 - 5.1 mmol/L   Chloride 95 (L) 98 - 111 mmol/L   CO2 24 22 - 32 mmol/L   Glucose, Bld 181 (H) 70 - 99 mg/dL    Comment: Glucose reference range applies only to samples taken after fasting for at least 8 hours.   BUN 20 8 - 23 mg/dL   Creatinine, Ser 1.76 (H) 0.44 - 1.00 mg/dL   Calcium 9.0 8.9 - 10.3 mg/dL   GFR, Estimated 32 (L) >60 mL/min     Comment: (NOTE) Calculated using the CKD-EPI Creatinine Equation (2021)    Anion gap 14 5 - 15    Comment: Performed at [facility], [address] [place], [place] [address]  CBC with Differential     Status: Abnormal   Collection Time: 03/09/22  5:59 PM  Result Value Ref Range   WBC 18.5 (H) 4.0 - 10.5 K/uL   RBC 4.28 3.87 - 5.11 MIL/uL   Hemoglobin 12.6 12.0 - 15.0 g/dL   HCT 37.5 36.0 - 46.0 %   MCV 87.6 80.0 - 100.0 fL   MCH 29.4 26.0 - 34.0 pg   MCHC 33.6 30.0 - 36.0 g/dL   RDW 12.8 11.5 - 15.5 %   Platelets 451 (H) 150 - 400 K/uL   nRBC 0.0 0.0 - 0.2 %   Neutrophils Relative % 86 %   Neutro Abs 15.8 (H) 1.7 - 7.7 K/uL   Lymphocytes Relative 7 %   Lymphs Abs 1.2 0.7 - 4.0 K/uL   Monocytes Relative 7 %   Monocytes Absolute 1.4 (H) 0.1 - 1.0 K/uL   Eosinophils Relative 0 %   Eosinophils Absolute 0.0 0.0 - 0.5 K/uL   Basophils Relative 0 %   Basophils Absolute 0.0 0.0 - 0.1 K/uL   Immature Granulocytes 0 %   Abs Immature Granulocytes 0.07 0.00 - 0.07 K/uL    Comment: Performed at [facility], [place] [address], [place],  [address]   CT ABDOMEN PELVIS W CONTRAST  Result Date: 03/09/2022 CLINICAL DATA:  Abdominal pain. Rectal pain. Concern for abscess or infection. EXAM: CT ABDOMEN AND PELVIS WITH CONTRAST TECHNIQUE: Multidetector CT imaging of the abdomen and pelvis was performed using the standard protocol following bolus administration of intravenous contrast. RADIATION DOSE REDUCTION: This exam was performed according to the departmental dose-optimization program which includes automated exposure control, adjustment of the mA and/or kV according to patient size and/or use of iterative reconstruction technique. CONTRAST:  5mL OMNIPAQUE IOHEXOL 300 MG/ML  SOLN COMPARISON:  None Available. FINDINGS: Evaluation is limited due to streak artifact caused by patient's arms as well as left hip arthroplasty. Lower chest: The visualized lung  bases are clear. No intra-abdominal free air or free fluid. Hepatobiliary: Probable mild fatty liver. No BD dilatation. There is gallstone. No pericholecystic fluid or evidence of acute cholecystitis by CT. Pancreas: Unremarkable. No pancreatic ductal dilatation or surrounding inflammatory changes. Spleen: Normal in size without focal abnormality. Adrenals/Urinary Tract: The adrenal glands are unremarkable. There is a 4.5 cm slightly lobulated left renal inferior pole cyst. Several bilateral small parapelvic cysts. There is no hydronephrosis on either side. The visualized ureters appear unremarkable. The urinary bladder is collapsed and suboptimally visualized. Stomach/Bowel: Postsurgical changes of gastric bypass. There is no bowel obstruction or active inflammation. There is sigmoid diverticulosis without active inflammatory changes. The appendix is normal. Vascular/Lymphatic: Moderate aortoiliac atherosclerotic disease. The IVC is unremarkable. No portal venous gas. There is no adenopathy. Reproductive: Hysterectomy.  No adnexal masses. Other: There is a 6.6 x 3.7 cm complex collection containing small pockets of air to the right of the anus. There is associated mild mass effect on the rectum. This is most consistent with an abscess. Underlying mass or neoplasm is not excluded. Clinical correlation recommended. Musculoskeletal: Total left hip arthroplasty. Osteopenia with degenerative changes of the spine. Bilateral L5 pars defects with grade 1 L5-S1 anterolisthesis. No acute osseous pathology. IMPRESSION: 1. Right perianal abscess. 2. Sigmoid diverticulosis. No bowel obstruction. Normal appendix. 3. Cholelithiasis. 4. Aortic Atherosclerosis (ICD10-I70.0). Electronically Signed   By: Anner Crete M.D.   On: 03/09/2022 21:32    Review of Systems  Constitutional:  Positive  for fever.  Gastrointestinal:  Positive for constipation and rectal pain.  All other systems reviewed and are negative.   Blood  pressure 105/61, pulse 81, temperature 98.6 F (37 C), temperature source Oral, resp. rate 17, SpO2 94 %. Physical Exam Constitutional:      Appearance: Normal appearance.  Eyes:     General: No scleral icterus. Cardiovascular:     Rate and Rhythm: Normal rate and regular rhythm.  Pulmonary:     Effort: Pulmonary effort is normal.  Abdominal:     General: There is no distension.     Palpations: Abdomen is soft.     Tenderness: There is no abdominal tenderness.  Genitourinary:    Comments: Tender fluctuant area to right of anus  Musculoskeletal:     Cervical back: Normal range of motion.  Skin:    General: Skin is warm and dry.     Capillary Refill: Capillary refill takes less than 2 seconds.  Neurological:     General: No focal deficit present.     Mental Status: She is alert.  Psychiatric:        Mood and Affect: Mood normal.        Behavior: Behavior normal.      Assessment/Plan Right perianal abscess -needs to go to OR for incision/drainage. -discussed procedure, will keep arm in sling during case, plan for packing of cavity for 24 hours then remove. -will admit for abx given wbc and history of RA on MTX -hydration and recheck Cr in am as well -subq heparin postop -SSI  Rolm Bookbinder, MD 03/09/2022, 10:23 PM

## 2022-03-09 NOTE — ED Triage Notes (Signed)
Pt reports rectal pain with sitting, fever, chills x3 days.

## 2022-03-09 NOTE — Telephone Encounter (Signed)
Patient called reports over the weekend she had constipation reports she started back on miralax which handled the constipation. Patient states she noticed rectal pressure, followed by chills, fever, highest temp over the weekend was 101, reports no temperature today but still has rectal pressure makes it very uncomfortable to sit. Reports her GI provider is Dr. Tarri Glenn, patient  has not reached out to her office. She is requesting an office visit with you. Reports history of this problems in past. No appointments available per patient request. Please advise

## 2022-03-10 ENCOUNTER — Encounter (HOSPITAL_COMMUNITY): Payer: Self-pay | Admitting: General Surgery

## 2022-03-10 LAB — BASIC METABOLIC PANEL
Anion gap: 11 (ref 5–15)
BUN: 19 mg/dL (ref 8–23)
CO2: 25 mmol/L (ref 22–32)
Calcium: 8.3 mg/dL — ABNORMAL LOW (ref 8.9–10.3)
Chloride: 102 mmol/L (ref 98–111)
Creatinine, Ser: 1.04 mg/dL — ABNORMAL HIGH (ref 0.44–1.00)
GFR, Estimated: >60 mL/min (ref >60)
Glucose, Bld: 212 mg/dL — ABNORMAL HIGH (ref 70–99)
Potassium: 3.7 mmol/L (ref 3.5–5.1)
Sodium: 138 mmol/L (ref 135–145)

## 2022-03-10 LAB — CBC
HCT: 33.8 % — ABNORMAL LOW (ref 36.0–46.0)
Hemoglobin: 11.2 g/dL — ABNORMAL LOW (ref 12.0–15.0)
MCH: 29.6 pg (ref 26.0–34.0)
MCHC: 33.1 g/dL (ref 30.0–36.0)
MCV: 89.4 fL (ref 80.0–100.0)
Platelets: 353 10*3/uL (ref 150–400)
RBC: 3.78 MIL/uL — ABNORMAL LOW (ref 3.87–5.11)
RDW: 12.8 % (ref 11.5–15.5)
WBC: 16.8 10*3/uL — ABNORMAL HIGH (ref 4.0–10.5)
nRBC: 0 % (ref 0.0–0.2)

## 2022-03-10 LAB — GLUCOSE, CAPILLARY
Glucose-Capillary: 133 mg/dL — ABNORMAL HIGH (ref 70–99)
Glucose-Capillary: 154 mg/dL — ABNORMAL HIGH (ref 70–99)
Glucose-Capillary: 185 mg/dL — ABNORMAL HIGH (ref 70–99)
Glucose-Capillary: 216 mg/dL — ABNORMAL HIGH (ref 70–99)
Glucose-Capillary: 233 mg/dL — ABNORMAL HIGH (ref 70–99)

## 2022-03-10 MED ORDER — SODIUM CHLORIDE 0.9 % IV SOLN
2.0000 g | INTRAVENOUS | Status: DC
  Administered 2022-03-10: 2 g via INTRAVENOUS
  Filled 2022-03-10: qty 20

## 2022-03-10 MED ORDER — METRONIDAZOLE 500 MG/100ML IV SOLN
500.0000 mg | Freq: Two times a day (BID) | INTRAVENOUS | Status: DC
  Administered 2022-03-10 – 2022-03-11 (×2): 500 mg via INTRAVENOUS
  Filled 2022-03-10 (×2): qty 100

## 2022-03-10 MED ORDER — ROSUVASTATIN CALCIUM 10 MG PO TABS
10.0000 mg | ORAL_TABLET | Freq: Every day | ORAL | Status: DC
  Administered 2022-03-10 – 2022-03-11 (×2): 10 mg via ORAL
  Filled 2022-03-10 (×2): qty 1

## 2022-03-10 MED ORDER — SODIUM CHLORIDE 0.9 % IV SOLN: INTRAVENOUS | Status: DC

## 2022-03-10 MED ORDER — MONTELUKAST SODIUM 10 MG PO TABS
10.0000 mg | ORAL_TABLET | Freq: Every day | ORAL | Status: DC
  Administered 2022-03-10 – 2022-03-11 (×2): 10 mg via ORAL
  Filled 2022-03-10 (×2): qty 1

## 2022-03-10 MED ORDER — ONDANSETRON 4 MG PO TBDP: 4.0000 mg | ORAL_TABLET | Freq: Four times a day (QID) | ORAL | Status: DC | PRN

## 2022-03-10 MED ORDER — MORPHINE SULFATE (PF) 2 MG/ML IV SOLN: 1.0000 mg | INTRAVENOUS | Status: DC | PRN

## 2022-03-10 MED ORDER — CYCLOBENZAPRINE HCL 10 MG PO TABS: 10.0000 mg | ORAL_TABLET | Freq: Four times a day (QID) | ORAL | Status: DC | PRN

## 2022-03-10 MED ORDER — FLUTICASONE PROPIONATE 50 MCG/ACT NA SUSP: 2.0000 | Freq: Every day | NASAL | Status: DC | PRN

## 2022-03-10 MED ORDER — LEVOTHYROXINE SODIUM 25 MCG PO TABS
125.0000 ug | ORAL_TABLET | Freq: Every day | ORAL | Status: DC
  Administered 2022-03-10 – 2022-03-11 (×2): 125 ug via ORAL
  Filled 2022-03-10 (×2): qty 1

## 2022-03-10 MED ORDER — ONDANSETRON HCL 4 MG/2ML IJ SOLN: 4.0000 mg | Freq: Four times a day (QID) | INTRAMUSCULAR | Status: DC | PRN

## 2022-03-10 MED ORDER — POLYETHYLENE GLYCOL 3350 17 G PO PACK
17.0000 g | PACK | ORAL | Status: DC
  Administered 2022-03-10: 17 g via ORAL
  Filled 2022-03-10: qty 1

## 2022-03-10 MED ORDER — HEPARIN SODIUM (PORCINE) 5000 UNIT/ML IJ SOLN
5000.0000 [IU] | Freq: Three times a day (TID) | INTRAMUSCULAR | Status: DC
  Administered 2022-03-10 – 2022-03-11 (×4): 5000 [IU] via SUBCUTANEOUS
  Filled 2022-03-10 (×4): qty 1

## 2022-03-10 MED ORDER — OXYCODONE HCL 5 MG PO TABS: 5.0000 mg | ORAL_TABLET | ORAL | Status: DC | PRN

## 2022-03-10 MED ORDER — INSULIN ASPART 100 UNIT/ML IJ SOLN
0.0000 [IU] | Freq: Three times a day (TID) | INTRAMUSCULAR | Status: DC
  Administered 2022-03-10: 3 [IU] via SUBCUTANEOUS
  Administered 2022-03-10 (×2): 5 [IU] via SUBCUTANEOUS

## 2022-03-10 MED ORDER — SIMETHICONE 80 MG PO CHEW: 40.0000 mg | CHEWABLE_TABLET | Freq: Four times a day (QID) | ORAL | Status: DC | PRN

## 2022-03-10 MED ORDER — ACETAMINOPHEN 500 MG PO TABS
1000.0000 mg | ORAL_TABLET | Freq: Four times a day (QID) | ORAL | Status: DC
  Administered 2022-03-10 – 2022-03-11 (×5): 1000 mg via ORAL
  Filled 2022-03-10 (×5): qty 2

## 2022-03-10 MED ORDER — FOLIC ACID 1 MG PO TABS
2.0000 mg | ORAL_TABLET | Freq: Every day | ORAL | Status: DC
  Administered 2022-03-10 – 2022-03-11 (×2): 2 mg via ORAL
  Filled 2022-03-10 (×2): qty 2

## 2022-03-10 NOTE — Transfer of Care (Signed)
Immediate Anesthesia Transfer of Care Note  Patient: Kathleen Russo  Procedure(s) Performed: IRRIGATION AND DEBRIDEMENT PERIRECTAL ABSCESS (Rectum)  Patient Location: PACU  Anesthesia Type:General  Level of Consciousness: awake  Airway & Oxygen Therapy: Patient Spontanous Breathing and Patient connected to face mask oxygen  Post-op Assessment: Report given to RN and Post -op Vital signs reviewed and stable  Post vital signs: Reviewed and stable  Last Vitals:  Vitals Value Taken Time  BP 102/49 03/09/22 2356  Temp    Pulse 78 03/09/22 2359  Resp    SpO2 98 % 03/09/22 2359  Vitals shown include unvalidated device data.  Last Pain:  Vitals:   03/09/22 2152  TempSrc: Oral  PainSc:          Complications: No notable events documented.

## 2022-03-10 NOTE — Anesthesia Postprocedure Evaluation (Signed)
Anesthesia Post Note  Patient: Kathleen Russo  Procedure(s) Performed: IRRIGATION AND DEBRIDEMENT PERIRECTAL ABSCESS (Rectum)     Patient location during evaluation: PACU Anesthesia Type: General Level of consciousness: awake and alert Pain management: pain level controlled Vital Signs Assessment: post-procedure vital signs reviewed and stable Respiratory status: spontaneous breathing, nonlabored ventilation and respiratory function stable Cardiovascular status: stable and blood pressure returned to baseline Anesthetic complications: no   No notable events documented.  Last Vitals:  Vitals:   03/10/22 0000 03/10/22 0015  BP: (!) 96/54 (!) 97/56  Pulse: 78 71  Resp: 11 15  Temp:    SpO2: 98% 92%    Last Pain:  Vitals:   03/10/22 0015  TempSrc:   PainSc: 0-No pain                 Audry Pili

## 2022-03-10 NOTE — TOC Initial Note (Addendum)
Transition of Care [facility]) - Initial/Assessment Note    Patient Details  Name: Kathleen Russo MRN: [id number] Date of Birth: 11-12-1960  Transition of Care [facility]) CM/SW Contact:    Golda Acre, RN Phone Number: 03/10/2022, 7:55 AM  Clinical Narrative:                  Transition of Care [facility]) Screening Note   Patient Details  Name: Kathleen Russo Date of Birth: 02/08/1961   Transition of Care [facility]) CM/SW Contact:    Golda Acre, RN Phone Number: 03/10/2022, 7:55 AM    Transition of Care Department [facility]) has reviewed patient and no TOC needs have been identified at this time. We will continue to monitor patient advancement through interdisciplinary progression rounds. If new patient transition needs arise, please place a TOC consult.     Patient Details  Name: Kathleen Russo Date of Birth: Sep 25, 1960 03/10/2022, 7:55 AM   To Whom It May Concern:  Transition of Care [facility]) Screening Note   Patient Details  Name: Kathleen Russo Date of Birth: 09/07/1960   Transition of Care [facility]) CM/SW Contact:    Golda Acre, RN Phone Number: 03/10/2022, 7:55 AM    Transition of Care Department [facility]) has reviewed patient and no TOC needs have been identified at this time. We will continue to monitor patient advancement through interdisciplinary progression rounds. If new patient transition needs arise, please place a TOC consult.     Transition of Care [facility]) CM/SW Contact: Golda Acre, RN Phone Number: 03/10/2022, 7:55 AM  Expected Discharge Plan: Home/Self Care Barriers to Discharge: Continued Medical Work up   Patient Goals and CMS Choice Patient states their goals for this hospitalization and ongoing recovery are:: to return to my home CMS Medicare.gov Compare Post Acute Care list provided to:: Patient    Expected Discharge Plan and Services Expected Discharge Plan: Home/Self Care   Discharge Planning Services: CM  Consult   Living arrangements for the past 2 months: Single Family Home                                      Prior Living Arrangements/Services Living arrangements for the past 2 months: Single Family Home Lives with:: Spouse Patient language and need for interpreter reviewed:: Yes Do you feel safe going back to the place where you live?: Yes            Criminal Activity/Legal Involvement Pertinent to Current Situation/Hospitalization: No - Comment as needed  Activities of Daily Living Home Assistive Devices/Equipment: None ADL Screening (condition at time of admission) Patient's cognitive ability adequate to safely complete daily activities?: Yes Is the patient deaf or have difficulty hearing?: No Does the patient have difficulty seeing, even when wearing glasses/contacts?: No Does the patient have difficulty concentrating, remembering, or making decisions?: No Patient able to express need for assistance with ADLs?: No Does the patient have difficulty dressing or bathing?: No Independently performs ADLs?: No Communication: Independent Dressing (OT): Independent Grooming: Independent Feeding: Independent Bathing: Independent Toileting: Needs assistance Is this a change from baseline?: Change from baseline, expected to last <3 days In/Out Bed: Needs assistance Is this a change from baseline?: Change from baseline, expected to last <3 days Walks in Home: Independent Does the patient have difficulty walking or climbing stairs?: No Weakness of Legs: None Weakness of Arms/Hands: Both  Permission Sought/Granted  Emotional Assessment Appearance:: Appears stated age Attitude/Demeanor/Rapport: Engaged Affect (typically observed): Calm Orientation: : Oriented to Self, Oriented to Place, Oriented to  Time, Oriented to Situation Alcohol / Substance Use: Never Used Psych Involvement: No (comment)  Admission diagnosis:  Rectal abscess [K61.1] AKI  (acute kidney injury) (HCC) [N17.9] Perirectal abscess [K61.1] Patient Active Problem List   Diagnosis Date Noted   Rectal pain 03/09/2022   Tachycardia 03/09/2022   Fever 03/09/2022   Perirectal abscess 03/09/2022   Non-seasonal allergic rhinitis due to pollen 11/29/2021   Localized swelling of both lower legs 11/29/2021   B12 deficiency 04/29/2021   Vitamin D deficiency 04/29/2021   Type 2 diabetes mellitus with hyperglycemia, without long-term current use of insulin (HCC) 04/29/2021   Chronic fatigue syndrome 04/29/2021   Rheumatoid arthritis (HCC) 04/29/2021   OSA (obstructive sleep apnea)    Breast pain in female 01/03/2020   Mixed hyperlipidemia 09/26/2019   Morbid obesity (HCC) 09/26/2019   History of left hip replacement 08/27/2017   Hypertension associated with diabetes (HCC) 06/08/2017   Hypothyroidism 06/08/2017   Combined hyperlipidemia associated with type 2 diabetes mellitus (HCC) 06/08/2017   Arthritis of left hip 05/12/2017   Chronic left hip pain 01/28/2017   Reduced libido 03/11/2016   OAB (overactive bladder) 04/03/2014   PCP:  Blane Ohara, MD Pharmacy:   [facility] - [place], [place] - [address] [facility] AT Portal to Registered [facility] [address] [place] [address] Phone: [phone] Fax: [phone]  [id number] - [place], [place] - [address] AT [facility] [address] [place] [address] Phone: [phone] Fax: [phone]     Social Determinants of Health (SDOH) Interventions    Readmission Risk Interventions   No data to display

## 2022-03-10 NOTE — Progress Notes (Signed)
[facility] Progress Note  1 Day Post-Op  Subjective: CC:  Pain controlled. Sitting up eating breakfast. Has not had a BM yet. States she does not have a bathtub at home but does have a raised/portable commode for sitz baths  Objective: Vital signs in last 24 hours: Temp:  [97.1 F (36.2 C)-100.3 F (37.9 C)] 97.8 F (36.6 C) (09/19 0516) Pulse Rate:  [66-113] 66 (09/19 0516) Resp:  [11-19] 18 (09/19 0516) BP: (90-111)/(49-73) 106/61 (09/19 0516) SpO2:  [92 %-100 %] 94 % (09/19 0516) Weight:  [114.3 kg] 114.3 kg (09/18 1540) Last BM Date : 03/09/22  Intake/Output from previous day: 09/18 0701 - 09/19 0700 In: 1931.5 [I.V.:732.5; IV Piggyback:999] Out: 700 [Urine:650; Blood:50] Intake/Output this shift: Total I/O In: 240 [P.O.:240] Out: -   PE: Gen:  Alert, NAD, pleasant Card:  Regular rate and rhythm, pedal pulses 2+ BL Pulm:  Normal effort, clear to auscultation bilaterally Abd: Soft, non-tender, non-distended GU: dressing c/d/i Skin: warm and dry, no rashes  Psych: A&Ox3   Lab Results:  Recent Labs    03/09/22 1759 03/10/22 0538  WBC 18.5* 16.8*  HGB 12.6 11.2*  HCT 37.5 33.8*  PLT 451* 353   BMET Recent Labs    03/09/22 1759 03/10/22 0538  NA 133* 138  K 3.4* 3.7  CL 95* 102  CO2 24 25  GLUCOSE 181* 212*  BUN 20 19  CREATININE 1.76* 1.04*  CALCIUM 9.0 8.3*   PT/INR No results for input(s): "LABPROT", "INR" in the last 72 hours. CMP     Component Value Date/Time   NA 138 03/10/2022 0538   NA 142 02/24/2022 0958   K 3.7 03/10/2022 0538   CL 102 03/10/2022 0538   CO2 25 03/10/2022 0538   GLUCOSE 212 (H) 03/10/2022 0538   BUN 19 03/10/2022 0538   BUN 10 02/24/2022 0958   CREATININE 1.04 (H) 03/10/2022 0538   CALCIUM 8.3 (L) 03/10/2022 0538   PROT 6.3 02/24/2022 0958   ALBUMIN 4.3 02/24/2022 0958   AST 11 02/24/2022 0958   ALT 11 02/24/2022 0958   ALKPHOS 117 02/24/2022 0958   BILITOT 0.3 02/24/2022 0958   GFRNONAA >60  03/10/2022 0538   GFRAA 104 07/24/2020 1020   Lipase  No results found for: "LIPASE"     Studies/Results: CT ABDOMEN PELVIS W CONTRAST  Result Date: 03/09/2022 CLINICAL DATA:  Abdominal pain. Rectal pain. Concern for abscess or infection. EXAM: CT ABDOMEN AND PELVIS WITH CONTRAST TECHNIQUE: Multidetector CT imaging of the abdomen and pelvis was performed using the standard protocol following bolus administration of intravenous contrast. RADIATION DOSE REDUCTION: This exam was performed according to the departmental dose-optimization program which includes automated exposure control, adjustment of the mA and/or kV according to patient size and/or use of iterative reconstruction technique. CONTRAST:  9m OMNIPAQUE IOHEXOL 300 MG/ML  SOLN COMPARISON:  None Available. FINDINGS: Evaluation is limited due to streak artifact caused by patient's arms as well as left hip arthroplasty. Lower chest: The visualized lung bases are clear. No intra-abdominal free air or free fluid. Hepatobiliary: Probable mild fatty liver. No BD dilatation. There is gallstone. No pericholecystic fluid or evidence of acute cholecystitis by CT. Pancreas: Unremarkable. No pancreatic ductal dilatation or surrounding inflammatory changes. Spleen: Normal in size without focal abnormality. Adrenals/Urinary Tract: The adrenal glands are unremarkable. There is a 4.5 cm slightly lobulated left renal inferior pole cyst. Several bilateral small parapelvic cysts. There is no hydronephrosis on either side. The visualized ureters  appear unremarkable. The urinary bladder is collapsed and suboptimally visualized. Stomach/Bowel: Postsurgical changes of gastric bypass. There is no bowel obstruction or active inflammation. There is sigmoid diverticulosis without active inflammatory changes. The appendix is normal. Vascular/Lymphatic: Moderate aortoiliac atherosclerotic disease. The IVC is unremarkable. No portal venous gas. There is no adenopathy.  Reproductive: Hysterectomy.  No adnexal masses. Other: There is a 6.6 x 3.7 cm complex collection containing small pockets of air to the right of the anus. There is associated mild mass effect on the rectum. This is most consistent with an abscess. Underlying mass or neoplasm is not excluded. Clinical correlation recommended. Musculoskeletal: Total left hip arthroplasty. Osteopenia with degenerative changes of the spine. Bilateral L5 pars defects with grade 1 L5-S1 anterolisthesis. No acute osseous pathology. IMPRESSION: 1. Right perianal abscess. 2. Sigmoid diverticulosis. No bowel obstruction. Normal appendix. 3. Cholelithiasis. 4. Aortic Atherosclerosis (ICD10-I70.0). Electronically Signed   By: Anner Crete M.D.   On: 03/09/2022 21:32    Anti-infectives: Anti-infectives (From admission, onward)    Start     Dose/Rate Route Frequency Ordered Stop   03/10/22 2200  cefTRIAXone (ROCEPHIN) 2 g in sodium chloride 0.9 % 100 mL IVPB       See Hyperspace for full Linked Orders Report.   2 g 200 mL/hr over 30 Minutes Intravenous Every 24 hours 03/10/22 0038 03/17/22 2159   03/10/22 0037  metroNIDAZOLE (FLAGYL) IVPB 500 mg       See Hyperspace for full Linked Orders Report.   500 mg 100 mL/hr over 60 Minutes Intravenous Every 12 hours 03/10/22 0038 03/17/22 0129   03/09/22 2200  cefTRIAXone (ROCEPHIN) 2 g in sodium chloride 0.9 % 100 mL IVPB       See Hyperspace for full Linked Orders Report.   2 g 200 mL/hr over 30 Minutes Intravenous  Once 03/09/22 2150 03/09/22 2234   03/09/22 2200  metroNIDAZOLE (FLAGYL) IVPB 500 mg  Status:  Discontinued       See Hyperspace for full Linked Orders Report.   500 mg 100 mL/hr over 60 Minutes Intravenous  Once 03/09/22 2150 03/10/22 0038        Assessment/Plan  Right perirectal abscess S/p I&D perirectal abscess 9/18 Dr. Donne Hazel - AFVSS, WBC 16 from 18 - continue IV abx - leave packing today (just got out of OR around midnight) and plan to remove  tomorrow - possible discharge home tomorrow on sitz baths and PO abx  AKI - creatinine 1.04 from 1.76, continue gently IVF today   FEN: Reg, IVF ID: Rocephin/flagyl VTE: SCDs, SQH  Foley: none Dispo: med surg, IV abx  LOS: 1 day   I reviewed nursing notes, last 24 h vitals and pain scores, last 48 h intake and output, last 24 h labs and trends, and last 24 h imaging results.   Obie Dredge, PA-C [facility] Please see Amion for pager number during day hours 7:00am-4:30pm

## 2022-03-10 NOTE — Progress Notes (Signed)
Flagyl '500mg'$  IV given by CRNA during surgery per [facility] CRNA

## 2022-03-10 NOTE — Progress Notes (Signed)
Mobility Specialist - Progress Note   03/10/22 1208  Mobility  Activity Ambulated with assistance in hallway  Level of Assistance Independent after set-up  Assistive Device None  Distance Ambulated (ft) 500 ft  Activity Response Tolerated well  $Mobility charge 1 Mobility   Pt received in bed and agreed to mobility, no c/o pain nor discomfort during ambulation. Pt returned to bed with all needs met.   Roderick Pee Mobility Specialist

## 2022-03-11 LAB — GLUCOSE, CAPILLARY: Glucose-Capillary: 106 mg/dL — ABNORMAL HIGH (ref 70–99)

## 2022-03-11 MED ORDER — ACETAMINOPHEN 500 MG PO TABS: 1000.0000 mg | ORAL_TABLET | Freq: Four times a day (QID) | ORAL | 0 refills | Status: DC

## 2022-03-11 MED ORDER — POLYETHYLENE GLYCOL 3350 17 GM/SCOOP PO POWD: 8.5000 g | Freq: Every day | ORAL | Status: AC

## 2022-03-11 MED ORDER — AMOXICILLIN-POT CLAVULANATE 875-125 MG PO TABS: 1.0000 | ORAL_TABLET | Freq: Two times a day (BID) | ORAL | 0 refills | Status: AC

## 2022-03-11 NOTE — TOC Transition Note (Signed)
Transition of Care [facility]) - CM/SW Discharge Note   Patient Details  Name: Kathleen Russo MRN: [id number] Date of Birth: 1960/10/30  Transition of [facility]) CM/SW Contact:  Leeroy Cha, RN Phone Number: 03/11/2022, 10:26 AM   Clinical Narrative:    Chart reviewed for toc needs.  Pt dcd to home with self care.   Final next level of care: Home/Self Care Barriers to Discharge: Barriers Resolved   Patient Goals and CMS Choice Patient states their goals for this hospitalization and ongoing recovery are:: to return to my home CMS Medicare.gov Compare Post Acute Care list provided to:: Patient    Discharge Placement                       Discharge Plan and Services   Discharge Planning Services: CM Consult                                 Social Determinants of Health (SDOH) Interventions     Readmission Risk Interventions   No data to display

## 2022-03-11 NOTE — Discharge Summary (Signed)
[facility] Discharge Summary   Patient ID: Kathleen Russo MRN: [id number] DOB/AGE: 1960-09-20 61 y.o.  Admit date: 03/09/2022 Discharge date: 03/11/2022  Admitting Diagnosis: Perirectal abscess  Discharge Diagnosis Patient Active Problem List   Diagnosis Date Noted   Rectal pain 03/09/2022   Tachycardia 03/09/2022   Fever 03/09/2022   Perirectal abscess 03/09/2022   Non-seasonal allergic rhinitis due to pollen 11/29/2021   Localized swelling of both lower legs 11/29/2021   B12 deficiency 04/29/2021   Vitamin D deficiency 04/29/2021   Type 2 diabetes mellitus with hyperglycemia, without long-term current use of insulin (HCC) 04/29/2021   Chronic fatigue syndrome 04/29/2021   Rheumatoid arthritis (HCC) 04/29/2021   OSA (obstructive sleep apnea)    Breast pain in female 01/03/2020   Mixed hyperlipidemia 09/26/2019   Morbid obesity (HCC) 09/26/2019   History of left hip replacement 08/27/2017   Hypertension associated with diabetes (HCC) 06/08/2017   Hypothyroidism 06/08/2017   Combined hyperlipidemia associated with type 2 diabetes mellitus (HCC) 06/08/2017   Arthritis of left hip 05/12/2017   Chronic left hip pain 01/28/2017   Reduced libido 03/11/2016   OAB (overactive bladder) 04/03/2014    Consultants None   Imaging: CT ABDOMEN PELVIS W CONTRAST  Result Date: 03/09/2022 CLINICAL DATA:  Abdominal pain. Rectal pain. Concern for abscess or infection. EXAM: CT ABDOMEN AND PELVIS WITH CONTRAST TECHNIQUE: Multidetector CT imaging of the abdomen and pelvis was performed using the standard protocol following bolus administration of intravenous contrast. RADIATION DOSE REDUCTION: This exam was performed according to the departmental dose-optimization program which includes automated exposure control, adjustment of the mA and/or kV according to patient size and/or use of iterative reconstruction technique. CONTRAST:  80mL OMNIPAQUE IOHEXOL 300 MG/ML  SOLN  COMPARISON:  None Available. FINDINGS: Evaluation is limited due to streak artifact caused by patient's arms as well as left hip arthroplasty. Lower chest: The visualized lung bases are clear. No intra-abdominal free air or free fluid. Hepatobiliary: Probable mild fatty liver. No BD dilatation. There is gallstone. No pericholecystic fluid or evidence of acute cholecystitis by CT. Pancreas: Unremarkable. No pancreatic ductal dilatation or surrounding inflammatory changes. Spleen: Normal in size without focal abnormality. Adrenals/Urinary Tract: The adrenal glands are unremarkable. There is a 4.5 cm slightly lobulated left renal inferior pole cyst. Several bilateral small parapelvic cysts. There is no hydronephrosis on either side. The visualized ureters appear unremarkable. The urinary bladder is collapsed and suboptimally visualized. Stomach/Bowel: Postsurgical changes of gastric bypass. There is no bowel obstruction or active inflammation. There is sigmoid diverticulosis without active inflammatory changes. The appendix is normal. Vascular/Lymphatic: Moderate aortoiliac atherosclerotic disease. The IVC is unremarkable. No portal venous gas. There is no adenopathy. Reproductive: Hysterectomy.  No adnexal masses. Other: There is a 6.6 x 3.7 cm complex collection containing small pockets of air to the right of the anus. There is associated mild mass effect on the rectum. This is most consistent with an abscess. Underlying mass or neoplasm is not excluded. Clinical correlation recommended. Musculoskeletal: Total left hip arthroplasty. Osteopenia with degenerative changes of the spine. Bilateral L5 pars defects with grade 1 L5-S1 anterolisthesis. No acute osseous pathology. IMPRESSION: 1. Right perianal abscess. 2. Sigmoid diverticulosis. No bowel obstruction. Normal appendix. 3. Cholelithiasis. 4. Aortic Atherosclerosis (ICD10-I70.0). Electronically Signed   By: Elgie Collard M.D.   On: 03/09/2022 21:32     Procedures Dr. Dwain Sarna 03/09/22 - I&D perirectal abscess  Hospital Course:  61 y/o F presented with perirectal pain and fever, admitted with  perirectal abscess. Underwent above procedure.  Tolerated procedure well and was transferred to the floor.  Diet was advanced as tolerated.  On POD#1 the patients packing was removed and she was started on sitz baths. On 03/11/22  the patient was voiding well, tolerating diet, ambulating well, pain well controlled, vital signs stable, and felt stable for discharge home.  Patient will follow up in our office in 2 weeks and knows to call with questions or concerns.   We will also need to determine if   Physical Exam: General:  Alert, NAD, pleasant, comfortable Abd:  Soft, NT/ND GU: right perirectal incisions ~3.5 cm. All packing removed periwound is soft and without cellulitis.   Allergies as of 03/11/2022       Reactions   Quinolones    Medications don't absorb   Tolmetin Other (See Comments)   THIS MEDICATION WILL NOT ABSORB D/T GBP Medications don't absorb        Medication List     TAKE these medications    acetaminophen 500 MG tablet Commonly known as: TYLENOL Take 2 tablets (1,000 mg total) by mouth every 6 (six) hours.   amoxicillin-clavulanate 875-125 MG tablet Commonly known as: AUGMENTIN Take 1 tablet by mouth 2 (two) times daily for 7 days.   blood glucose meter kit and supplies Kit Dispense based on patient and insurance preference. Check daily in am. E11.69.   clobetasol ointment 0.05 % Commonly known as: TEMOVATE Apply 1 application topically 2 (two) times daily.   cyanocobalamin 1000 MCG/ML injection Commonly known as: VITAMIN B12 Inject 1 ml weekly for 4 weeks then 1 ml every 4 weeks.   cyclobenzaprine 10 MG tablet Commonly known as: FLEXERIL Take 10 mg by mouth every 6 (six) hours as needed for muscle spasms.   fluticasone 50 MCG/ACT nasal spray Commonly known as: FLONASE Place 2 sprays into both nostrils  daily. What changed:  when to take this reasons to take this   folic acid 1 MG tablet Commonly known as: FOLVITE Take 1 tablet (1 mg total) by mouth 2 (two) times daily. What changed:  how much to take when to take this   furosemide 20 MG tablet Commonly known as: LASIX Take 1 tablet (20 mg total) by mouth daily as needed for edema.   HYDROcodone-acetaminophen 5-325 MG tablet Commonly known as: NORCO/VICODIN Take 1 tablet by mouth every 6 (six) hours as needed for moderate pain or severe pain.   levothyroxine 112 MCG tablet Commonly known as: SYNTHROID Take 1 tablet (112 mcg total) by mouth daily before breakfast.   lisinopril-hydrochlorothiazide 10-12.5 MG tablet Commonly known as: ZESTORETIC TAKE ONE (1) TABLET BY MOUTH ONCE DAILY   loratadine 10 MG tablet Commonly known as: CLARITIN Take 1 tablet (10 mg total) by mouth daily. What changed:  when to take this reasons to take this   Methotrexate 25 MG/ML Sosy Inject 0.7 mLs into the skin once a week. METHOTREXATE 25MG /ML MDV   montelukast 10 MG tablet Commonly known as: SINGULAIR Take 1 tablet (10 mg total) by mouth at bedtime. What changed: when to take this   nystatin powder Commonly known as: MYCOSTATIN/NYSTOP Apply 1 application. topically 3 (three) times daily. Apply to affected area for up to 7 days   ondansetron 4 MG tablet Commonly known as: ZOFRAN Take 4 mg by mouth every 8 (eight) hours as needed for nausea or vomiting.   OneTouch Ultra test strip Generic drug: glucose blood USE TO CHECK BLOOD SUGAR ONCE DAILY  IN THE MORNING   Ozempic (2 MG/DOSE) 8 MG/3ML Sopn Generic drug: Semaglutide (2 MG/DOSE) INJECT 2 MG UNDER THE SKIN ONCE A WEEK What changed: See the new instructions.   polyethylene glycol 17 g packet Commonly known as: MIRALAX / GLYCOLAX Take 17 g by mouth every other day. What changed: Another medication with the same name was added. Make sure you understand how and when to take  each.   polyethylene glycol powder 17 GM/SCOOP powder Commonly known as: MiraLax Take 8.5-34 g by mouth daily. To correct constipation.  Adjust dose over 1-2 months.  Goal = ~1 bowel movement / day What changed: You were already taking a medication with the same name, and this prescription was added. Make sure you understand how and when to take each.   rosuvastatin 10 MG tablet Commonly known as: CRESTOR TAKE 1 TABLET BY MOUTH EVERY DAY   Vitamin D (Ergocalciferol) 1.25 MG (50000 UNIT) Caps capsule Commonly known as: DRISDOL TAKE 1 CAPSULE BY MOUTH EVERY 7 DAYS          Follow-up Information     Maczis, Hedda Slade, PA-C Follow up on 03/24/2022.   Specialty: General Surgery Why: at 1:30 PM for wound check, please arrive 30 minutes early Contact information: [address] [place] [address] [phone]                 Signed: Hosie Spangle, [facility] 03/11/2022, 4:19 PM

## 2022-03-17 ENCOUNTER — Other Ambulatory Visit: Payer: Self-pay | Admitting: Family Medicine

## 2022-03-17 DIAGNOSIS — E782 Mixed hyperlipidemia: Secondary | ICD-10-CM

## 2022-03-18 MED ORDER — ROSUVASTATIN CALCIUM 10 MG PO TABS: 10.0000 mg | ORAL_TABLET | Freq: Every day | ORAL | 0 refills | Status: DC

## 2022-03-23 ENCOUNTER — Other Ambulatory Visit: Payer: Self-pay | Admitting: Family Medicine

## 2022-03-23 ENCOUNTER — Telehealth: Payer: Self-pay

## 2022-03-23 DIAGNOSIS — E1165 Type 2 diabetes mellitus with hyperglycemia: Secondary | ICD-10-CM

## 2022-03-23 MED ORDER — FLUCONAZOLE 150 MG PO TABS: 150.0000 mg | ORAL_TABLET | Freq: Every day | ORAL | 0 refills | Status: DC

## 2022-03-23 NOTE — Telephone Encounter (Signed)
Patient was informed.

## 2022-03-23 NOTE — Telephone Encounter (Signed)
Patient called and stated that after all the many antibiotics she has been on over the past few weeks that now she has a yeast infection and is wanting to know if you could call in something to her pharmacy. Please advise.

## 2022-03-24 ENCOUNTER — Other Ambulatory Visit: Payer: Self-pay | Admitting: Family Medicine

## 2022-03-26 ENCOUNTER — Other Ambulatory Visit: Payer: BC Managed Care – PPO

## 2022-04-03 ENCOUNTER — Other Ambulatory Visit: Payer: Self-pay | Admitting: Family Medicine

## 2022-04-07 ENCOUNTER — Other Ambulatory Visit: Payer: BC Managed Care – PPO

## 2022-04-14 ENCOUNTER — Other Ambulatory Visit: Payer: Self-pay | Admitting: Obstetrics and Gynecology

## 2022-04-14 DIAGNOSIS — E2839 Other primary ovarian failure: Secondary | ICD-10-CM

## 2022-04-21 ENCOUNTER — Ambulatory Visit
Admission: RE | Admit: 2022-04-21 | Discharge: 2022-04-21 | Disposition: A | Discharge status: Home / Self Care | Payer: BC Managed Care – PPO | Source: Ambulatory Visit | Attending: Family Medicine | Admitting: Family Medicine

## 2022-04-21 ENCOUNTER — Other Ambulatory Visit: Payer: BC Managed Care – PPO

## 2022-04-21 DIAGNOSIS — Z1231 Encounter for screening mammogram for malignant neoplasm of breast: Secondary | ICD-10-CM

## 2022-04-28 ENCOUNTER — Encounter: Payer: Self-pay | Admitting: Family Medicine

## 2022-04-28 ENCOUNTER — Ambulatory Visit (INDEPENDENT_AMBULATORY_CARE_PROVIDER_SITE_OTHER): Payer: BC Managed Care – PPO | Admitting: Family Medicine

## 2022-04-28 VITALS — BP 124/72 | HR 68 | Temp 97.5°F | Ht 65.0 in | Wt 252.0 lb

## 2022-04-28 DIAGNOSIS — M7552 Bursitis of left shoulder: Secondary | ICD-10-CM | POA: Insufficient documentation

## 2022-04-28 MED ORDER — TRIAMCINOLONE ACETONIDE 40 MG/ML IJ SUSP
40.0000 mg | Freq: Once | INTRAMUSCULAR | Status: AC
  Administered 2022-04-28: 40 mg via INTRAMUSCULAR

## 2022-04-28 NOTE — Progress Notes (Signed)
Subjective:  Patient ID: Kathleen Russo, female    DOB: 07/27/1960  Age: 61 y.o. MRN: [id number]  Chief Complaint  Patient presents with   Left shoulder pain    HPI  Complaining of left shoulder pain. Hurts the most on lateral side. Patient has had injections, but has been > 3 months.    Current Outpatient Medications on File Prior to Visit  Medication Sig Dispense Refill   acetaminophen (TYLENOL) 500 MG tablet Take 2 tablets (1,000 mg total) by mouth every 6 (six) hours. 30 tablet 0   blood glucose meter kit and supplies KIT Dispense based on patient and insurance preference. Check daily in am. E11.69. 1 each 0   cyanocobalamin (VITAMIN B12) 1000 MCG/ML injection Inject 1 ml weekly for 4 weeks then 1 ml every 4 weeks. 10 mL 1   cyclobenzaprine (FLEXERIL) 10 MG tablet Take 10 mg by mouth every 6 (six) hours as needed for muscle spasms.     fluconazole (DIFLUCAN) 150 MG tablet Take 1 tablet (150 mg total) by mouth daily. 2 tablet 0   fluticasone (FLONASE) 50 MCG/ACT nasal spray Place 2 sprays into both nostrils daily. (Patient taking differently: Place 2 sprays into both nostrils daily as needed for allergies.) 16 g 6   folic acid (FOLVITE) 1 MG tablet Take 1 tablet (1 mg total) by mouth 2 (two) times daily. (Patient taking differently: Take 2 mg by mouth daily.) 180 tablet 3   HYDROcodone-acetaminophen (NORCO/VICODIN) 5-325 MG tablet Take 1 tablet by mouth every 6 (six) hours as needed for moderate pain or severe pain.     levothyroxine (SYNTHROID) 112 MCG tablet Take 1 tablet (112 mcg total) by mouth daily before breakfast. 90 tablet 0   lisinopril-hydrochlorothiazide (ZESTORETIC) 10-12.5 MG tablet TAKE 1 TABLET BY MOUTH EVERY DAY 90 tablet 1   loratadine (CLARITIN) 10 MG tablet Take 1 tablet (10 mg total) by mouth daily. (Patient taking differently: Take 10 mg by mouth daily as needed for allergies.) 90 tablet 2   Methotrexate 25 MG/ML SOSY Inject 0.7 mLs into the skin once a week.  METHOTREXATE 25MG /ML MDV     montelukast (SINGULAIR) 10 MG tablet Take 1 tablet (10 mg total) by mouth at bedtime. (Patient taking differently: Take 10 mg by mouth in the morning.) 90 tablet 3   ondansetron (ZOFRAN) 4 MG tablet Take 4 mg by mouth every 8 (eight) hours as needed for nausea or vomiting.     ONETOUCH ULTRA test strip USE TO CHECK BLOOD SUGAR ONCE DAILY IN THE MORNING 50 each 1   OZEMPIC, 2 MG/DOSE, 8 MG/3ML SOPN INJECT 2 MG UNDER THE SKIN ONCE WEEKLY 9 mL 0   polyethylene glycol powder (MIRALAX) 17 GM/SCOOP powder Take 8.5-34 g by mouth daily. To correct constipation.  Adjust dose over 1-2 months.  Goal = ~1 bowel movement / day     rosuvastatin (CRESTOR) 10 MG tablet Take 1 tablet (10 mg total) by mouth daily. 90 tablet 0   Vitamin D, Ergocalciferol, (DRISDOL) 1.25 MG (50000 UNIT) CAPS capsule TAKE 1 CAPSULE BY MOUTH EVERY 7 DAYS 12 capsule 0   clobetasol ointment (TEMOVATE) 0.05 % Apply 1 application topically 2 (two) times daily. (Patient not taking: Reported on 03/09/2022) 60 g 1   furosemide (LASIX) 20 MG tablet Take 1 tablet (20 mg total) by mouth daily as needed for edema. (Patient not taking: Reported on 03/09/2022) 90 tablet 3   nystatin (MYCOSTATIN/NYSTOP) powder Apply 1 application. topically 3 (  three) times daily. Apply to affected area for up to 7 days (Patient not taking: Reported on 03/09/2022) 30 g 2   No current facility-administered medications on file prior to visit.   Past Medical History:  Diagnosis Date   Anemia    Arthritis    Cancer (HCC)    UTERINE   Diabetes mellitus    DM2, not requiring meds as of 04/2011   Dizziness    GERD (gastroesophageal reflux disease)    Hemorrhoids    Hyperlipidemia    Hypertension    Hypothyroidism    Lichen sclerosus    PONV (postoperative nausea and vomiting)    Primary osteoarthritis    Sleep apnea    USES CPAP   Swelling of both ankles    Type 2 diabetes mellitus (HCC)    Past Surgical History:  Procedure  Laterality Date   ABDOMINAL HYSTERECTOMY     BARIATRIC SURGERY     BREAST LUMPECTOMY Left    BREAST SURGERY  1979   ELBOW SURGERY  1990'S   GASTRIC BYPASS  2007   INCISION AND DRAINAGE PERIRECTAL ABSCESS N/A 03/09/2022   Procedure: IRRIGATION AND DEBRIDEMENT PERIRECTAL ABSCESS;  Surgeon: Emelia Loron, MD;  Location: [facility];  Service: General;  Laterality: N/A;   JOINT REPLACEMENT  2002   RT. KNEE   KNEE ARTHROSCOPY  1993   LT. KNEE   KNEE ARTHROSCOPY W/ LATERAL RELEASE  1987   RT. KNEE   left hip replacement  05/2017   SHOULDER SURGERY Right 01/20/2022   rotator cuff repair   TONSILLECTOMY     TOTAL KNEE ARTHROPLASTY  07/06/2011   Procedure: TOTAL KNEE ARTHROPLASTY;  Surgeon: Raymon Mutton, MD;  Location: [facility];  Service: Orthopedics;  Laterality: Left;    Family History  Problem Relation Age of Onset   Hypertension Mother    Thyroid disease Mother    Diabetes Mother    Other Father        Wegoners disease   Thyroid disease Sister    Diabetes Sister    Breast cancer Daughter    Breast cancer Maternal Aunt    Breast cancer Paternal Aunt    Graves' disease Brother    Social History   Socioeconomic History   Marital status: Married    Spouse name: Not on file   Number of children: Not on file   Years of education: Not on file   Highest education level: Not on file  Occupational History   Occupation: retired    Comment: [employer]  Tobacco Use   Smoking status: Never   Smokeless tobacco: Never  Vaping Use   Vaping Use: Never used  Substance and Sexual Activity   Alcohol use: No   Drug use: No   Sexual activity: Yes    Partners: Male    Birth control/protection: Surgical    Comment: hysterectomy  Other Topics Concern   Not on file  Social History Narrative   Not on file   Social Determinants of Health   Financial Resource Strain: Not on file  Food Insecurity: No Food Insecurity (03/10/2022)   Hunger Vital Sign    Worried About Running Out of Food in the  Last Year: Never true    Ran Out of Food in the Last Year: Never true  Transportation Needs: No Transportation Needs (03/10/2022)   PRAPARE - Administrator, Civil Service (Medical): No    Lack of Transportation (Non-Medical): No  Physical Activity: Not on  file  Stress: Not on file  Social Connections: Not on file    Review of Systems  Constitutional:  Negative for appetite change, fatigue and fever.  HENT:  Negative for congestion, ear pain, sinus pressure and sore throat.   Respiratory:  Negative for cough, chest tightness, shortness of breath and wheezing.   Cardiovascular:  Negative for chest pain and palpitations.  Gastrointestinal:  Negative for abdominal pain, constipation, diarrhea, nausea and vomiting.  Genitourinary:  Negative for dysuria and hematuria.  Musculoskeletal:  Positive for arthralgias and myalgias (Left shoulder pain). Negative for back pain and joint swelling.  Skin:  Negative for rash.  Neurological:  Negative for dizziness, weakness and headaches.  Psychiatric/Behavioral:  Negative for dysphoric mood. The patient is not nervous/anxious.      Objective:  BP 124/72 (BP Location: Left Arm, Patient Position: Sitting)   Pulse 68   Temp (!) 97.5 F (36.4 C) (Temporal)   Ht 5\' 5"  (1.651 m)   Wt 252 lb (114.3 kg)   SpO2 98%   BMI 41.93 kg/m      04/28/2022    3:00 PM 03/11/2022    4:31 AM 03/10/2022    9:11 PM  BP/Weight  Systolic BP 124 92 127  Diastolic BP 72 59 64  Wt. (Lbs) 252    BMI 41.93 kg/m2      Physical Exam Vitals reviewed.  Constitutional:      Appearance: Normal appearance.  Musculoskeletal:        General: Tenderness (subacromial bursa tenderness. FROM, but discomfort.) present.  Neurological:     Mental Status: She is alert.     Diabetic Foot Exam - Simple   No data filed      Lab Results  Component Value Date   WBC 16.8 (H) 03/10/2022   HGB 11.2 (L) 03/10/2022   HCT 33.8 (L) 03/10/2022   PLT 353 03/10/2022    GLUCOSE 212 (H) 03/10/2022   CHOL 131 02/24/2022   TRIG 131 02/24/2022   HDL 53 02/24/2022   LDLCALC 55 02/24/2022   ALT 11 02/24/2022   AST 11 02/24/2022   NA 138 03/10/2022   K 3.7 03/10/2022   CL 102 03/10/2022   CREATININE 1.04 (H) 03/10/2022   BUN 19 03/10/2022   CO2 25 03/10/2022   TSH 0.373 (L) 02/24/2022   INR 0.92 06/25/2011   HGBA1C 6.6 (H) 02/24/2022   MICROALBUR 30 07/24/2020      Assessment & Plan:   Problem List Items Addressed This Visit       Musculoskeletal and Integument   Subacromial bursitis of left shoulder joint - Primary    Risks were discussed including bleeding, infection, increase in sugars if diabetic, atrophy at site of injection, and increased pain.  After consent was obtained, using sterile technique the  shoulder was prepped with alcohol.  The subacromial bursa was entered perpendicularly and  Kenalog 40 mg and 5 ml plain Lidocaine was then injected and the needle withdrawn.  The procedure was well tolerated.   The patient is asked to continue to rest the joint for a few more days before resuming regular activities.  It may be more painful for the first 1-2 days.  Watch for fever, or increased swelling or persistent pain in the joint. Call or return to clinic prn if such symptoms occur or there is failure to improve as anticipated.      . Follow-up: Return if symptoms worsen or fail to improve.  An After Visit Summary  was printed and given to the patient.    I,Lauren M Auman,acting as a scribe for Blane Ohara, MD.,have documented all relevant documentation on the behalf of Blane Ohara, MD,as directed by  Blane Ohara, MD while in the presence of Blane Ohara, MD.    Blane Ohara, MD [facility] [phone]

## 2022-04-28 NOTE — Addendum Note (Signed)
Addended by: Oleta Mouse on: 04/28/2022 04:47 PM   Modules accepted: Orders

## 2022-04-28 NOTE — Addendum Note (Signed)
Addended by: Oleta Mouse on: 04/28/2022 04:23 PM   Modules accepted: Orders

## 2022-04-28 NOTE — Assessment & Plan Note (Signed)
Risks were discussed including bleeding, infection, increase in sugars if diabetic, atrophy at site of injection, and increased pain.  After consent was obtained, using sterile technique the  shoulder was prepped with alcohol.  The subacromial bursa was entered perpendicularly and  Kenalog 40 mg and 5 ml plain Lidocaine was then injected and the needle withdrawn.  The procedure was well tolerated.   The patient is asked to continue to rest the joint for a few more days before resuming regular activities.  It may be more painful for the first 1-2 days.  Watch for fever, or increased swelling or persistent pain in the joint. Call or return to clinic prn if such symptoms occur or there is failure to improve as anticipated.

## 2022-05-16 ENCOUNTER — Other Ambulatory Visit: Payer: Self-pay | Admitting: Nurse Practitioner

## 2022-05-16 DIAGNOSIS — J018 Other acute sinusitis: Secondary | ICD-10-CM

## 2022-05-22 HISTORY — PX: SHOULDER ARTHROSCOPY: SHX128

## 2022-05-26 ENCOUNTER — Other Ambulatory Visit: Payer: Self-pay

## 2022-06-02 ENCOUNTER — Encounter: Payer: Self-pay | Admitting: Family Medicine

## 2022-06-02 ENCOUNTER — Ambulatory Visit (INDEPENDENT_AMBULATORY_CARE_PROVIDER_SITE_OTHER): Payer: BC Managed Care – PPO | Admitting: Family Medicine

## 2022-06-02 VITALS — BP 110/70 | HR 60 | Temp 96.8°F | Resp 16 | Ht 65.0 in | Wt 247.0 lb

## 2022-06-02 DIAGNOSIS — E1165 Type 2 diabetes mellitus with hyperglycemia: Secondary | ICD-10-CM

## 2022-06-02 DIAGNOSIS — E1159 Type 2 diabetes mellitus with other circulatory complications: Secondary | ICD-10-CM | POA: Diagnosis not present

## 2022-06-02 DIAGNOSIS — M7552 Bursitis of left shoulder: Secondary | ICD-10-CM | POA: Diagnosis not present

## 2022-06-02 DIAGNOSIS — E782 Mixed hyperlipidemia: Secondary | ICD-10-CM

## 2022-06-02 DIAGNOSIS — E538 Deficiency of other specified B group vitamins: Secondary | ICD-10-CM

## 2022-06-02 DIAGNOSIS — E559 Vitamin D deficiency, unspecified: Secondary | ICD-10-CM

## 2022-06-02 DIAGNOSIS — Z23 Encounter for immunization: Secondary | ICD-10-CM | POA: Diagnosis not present

## 2022-06-02 DIAGNOSIS — I152 Hypertension secondary to endocrine disorders: Secondary | ICD-10-CM

## 2022-06-02 DIAGNOSIS — E039 Hypothyroidism, unspecified: Secondary | ICD-10-CM

## 2022-06-02 DIAGNOSIS — M0579 Rheumatoid arthritis with rheumatoid factor of multiple sites without organ or systems involvement: Secondary | ICD-10-CM

## 2022-06-02 NOTE — Progress Notes (Signed)
Subjective:  Patient ID: Kathleen Russo, female    DOB: [birth year]  Age: 61 y.o. MRN: [id number]  Chief Complaint  Patient presents with   Diabetes   Hypertension    HPI  Diabetes:  Complications: Glucose checking:  three to four times weekly  Glucose logs: 98- 140, Hypoglycemia: none  Most recent A1C: 6.6 Current medications: ozempic 2 mg weekly  Last Eye Exam: few months ago.  Foot checks: daily   Hyperlipidemia: Current medications: Rosuvastatin 10 mg daily   Hypertension: Current medications: Lisinopril-hydrochlorothiazide 10-12.5 daily  Hypothyroidism: on synthroid 125 mcg once daily in am.  RA: stopped  mtx, folate, etodolac. [facility] rheumatology following.  Vitamin D deficiency: taking vitamin D once weekly.  Vitamin B12 deficiency: Monthly injections.    Diet: healthy Exercise: walking.   Recent surgery last week. Doing great.   Current Outpatient Medications on File Prior to Visit  Medication Sig Dispense Refill   acetaminophen (TYLENOL) 500 MG tablet Take 2 tablets (1,000 mg total) by mouth every 6 (six) hours. 30 tablet 0   blood glucose meter kit and supplies KIT Dispense based on patient and insurance preference. Check daily in am. E11.69. 1 each 0   clobetasol ointment (TEMOVATE) 0.05 % Apply 1 application topically 2 (two) times daily. 60 g 1   cyanocobalamin (VITAMIN B12) 1000 MCG/ML injection Inject 1 ml weekly for 4 weeks then 1 ml every 4 weeks. 10 mL 1   cyclobenzaprine (FLEXERIL) 10 MG tablet Take 10 mg by mouth every 6 (six) hours as needed for muscle spasms.     levothyroxine (SYNTHROID) 112 MCG tablet Take 1 tablet (112 mcg total) by mouth daily before breakfast. 90 tablet 0   lisinopril-hydrochlorothiazide (ZESTORETIC) 10-12.5 MG tablet TAKE 1 TABLET BY MOUTH EVERY DAY 90 tablet 1   loratadine (CLARITIN) 10 MG tablet Take 1 tablet (10 mg total) by mouth daily. (Patient taking differently: Take 10 mg by mouth daily as needed for allergies.)  90 tablet 2   montelukast (SINGULAIR) 10 MG tablet TAKE 1 TABLET(10 MG) BY MOUTH AT BEDTIME 90 tablet 3   nystatin (MYCOSTATIN/NYSTOP) powder Apply 1 application. topically 3 (three) times daily. Apply to affected area for up to 7 days 30 g 2   ondansetron (ZOFRAN) 4 MG tablet Take 4 mg by mouth every 8 (eight) hours as needed for nausea or vomiting.     ONETOUCH ULTRA test strip USE TO CHECK BLOOD SUGAR ONCE DAILY IN THE MORNING 50 each 1   OZEMPIC, 2 MG/DOSE, 8 MG/3ML SOPN INJECT 2 MG UNDER THE SKIN ONCE WEEKLY 9 mL 0   polyethylene glycol powder (MIRALAX) 17 GM/SCOOP powder Take 8.5-34 g by mouth daily. To correct constipation.  Adjust dose over 1-2 months.  Goal = ~1 bowel movement / day     rosuvastatin (CRESTOR) 10 MG tablet Take 1 tablet (10 mg total) by mouth daily. 90 tablet 0   Vitamin D, Ergocalciferol, (DRISDOL) 1.25 MG (50000 UNIT) CAPS capsule TAKE 1 CAPSULE BY MOUTH EVERY 7 DAYS 12 capsule 0   fluticasone (FLONASE) 50 MCG/ACT nasal spray Place 2 sprays into both nostrils daily. (Patient taking differently: Place 2 sprays into both nostrils daily as needed for allergies.) 16 g 6   No current facility-administered medications on file prior to visit.   Past Medical History:  Diagnosis Date   Anemia    Arthritis    Cancer (HCC)    UTERINE   Diabetes mellitus    DM2, not requiring  meds as of 04/2011   Dizziness    GERD (gastroesophageal reflux disease)    Hemorrhoids    Hyperlipidemia    Hypertension    Hypothyroidism    Lichen sclerosus    PONV (postoperative nausea and vomiting)    Primary osteoarthritis    Sleep apnea    USES CPAP   Swelling of both ankles    Type 2 diabetes mellitus (HCC)    Past Surgical History:  Procedure Laterality Date   ABDOMINAL HYSTERECTOMY     BARIATRIC SURGERY     BREAST LUMPECTOMY Left    BREAST SURGERY  1979   ELBOW SURGERY  1990'S   GASTRIC BYPASS  2007   INCISION AND DRAINAGE PERIRECTAL ABSCESS N/A 03/09/2022   Procedure:  IRRIGATION AND DEBRIDEMENT PERIRECTAL ABSCESS;  Surgeon: Emelia Loron, MD;  Location: [facility];  Service: General;  Laterality: N/A;   JOINT REPLACEMENT  2002   RT. KNEE   KNEE ARTHROSCOPY  1993   LT. KNEE   KNEE ARTHROSCOPY W/ LATERAL RELEASE  1987   RT. KNEE   left hip replacement  05/2017   SHOULDER SURGERY Right 01/20/2022   rotator cuff repair   TONSILLECTOMY     TOTAL KNEE ARTHROPLASTY  07/06/2011   Procedure: TOTAL KNEE ARTHROPLASTY;  Surgeon: Raymon Mutton, MD;  Location: [facility];  Service: Orthopedics;  Laterality: Left;    Family History  Problem Relation Age of Onset   Hypertension Mother    Thyroid disease Mother    Diabetes Mother    Other Father        Wegoners disease   Thyroid disease Sister    Diabetes Sister    Breast cancer Daughter    Breast cancer Maternal Aunt    Breast cancer Paternal Aunt    Graves' disease Brother    Social History   Socioeconomic History   Marital status: Married    Spouse name: Not on file   Number of children: Not on file   Years of education: Not on file   Highest education level: Not on file  Occupational History   Occupation: retired    Comment: [employer]  Tobacco Use   Smoking status: Never   Smokeless tobacco: Never  Vaping Use   Vaping Use: Never used  Substance and Sexual Activity   Alcohol use: No   Drug use: No   Sexual activity: Yes    Partners: Male    Birth control/protection: Surgical    Comment: hysterectomy  Other Topics Concern   Not on file  Social History Narrative   Not on file   Social Determinants of Health   Financial Resource Strain: Not on file  Food Insecurity: No Food Insecurity (03/10/2022)   Hunger Vital Sign    Worried About Running Out of Food in the Last Year: Never true    Ran Out of Food in the Last Year: Never true  Transportation Needs: No Transportation Needs (03/10/2022)   PRAPARE - Administrator, Civil Service (Medical): No    Lack of Transportation  (Non-Medical): No  Physical Activity: Not on file  Stress: Not on file  Social Connections: Not on file    Review of Systems  Constitutional:  Negative for chills, fatigue and fever.  HENT:  Negative for congestion, ear pain and sore throat.   Respiratory:  Negative for cough and shortness of breath.   Cardiovascular:  Negative for chest pain.  Gastrointestinal:  Negative for abdominal pain, constipation, diarrhea,  nausea and vomiting.  Genitourinary:  Negative for dysuria and urgency.  Musculoskeletal:  Negative for arthralgias and myalgias.  Skin:  Negative for rash.  Neurological:  Negative for dizziness and headaches.  Psychiatric/Behavioral:  Negative for dysphoric mood. The patient is not nervous/anxious.      Objective:  BP 110/70   Pulse 60   Temp (!) 96.8 F (36 C)   Resp 16   Ht 5\' 5"  (1.651 m)   Wt 247 lb (112 kg)   BMI 41.10 kg/m      06/02/2022    8:31 AM 04/28/2022    3:00 PM 03/11/2022    4:31 AM  BP/Weight  Systolic BP 110 124 92  Diastolic BP 70 72 59  Wt. (Lbs) 247 252   BMI 41.1 kg/m2 41.93 kg/m2     Physical Exam Vitals reviewed.  Constitutional:      Appearance: Normal appearance. She is normal weight.  Neck:     Vascular: No carotid bruit.  Cardiovascular:     Rate and Rhythm: Normal rate and regular rhythm.     Heart sounds: Normal heart sounds.  Pulmonary:     Effort: Pulmonary effort is normal. No respiratory distress.     Breath sounds: Normal breath sounds.  Abdominal:     General: Abdomen is flat. Bowel sounds are normal.     Palpations: Abdomen is soft.     Tenderness: There is no abdominal tenderness.  Neurological:     Mental Status: She is alert and oriented to person, place, and time.  Psychiatric:        Mood and Affect: Mood normal.        Behavior: Behavior normal.     Diabetic Foot Exam - Simple   Simple Foot Form Diabetic Foot exam was performed with the following findings: Yes 06/02/2022  9:15 AM  Visual  Inspection See comments: Yes Sensation Testing Intact to touch and monofilament testing bilaterally: Yes Pulse Check Posterior Tibialis and Dorsalis pulse intact bilaterally: Yes Comments Pes planus BL.       Lab Results  Component Value Date   WBC 10.1 06/02/2022   HGB 13.5 06/02/2022   HCT 41.0 06/02/2022   PLT 351 06/02/2022   GLUCOSE 125 (H) 06/02/2022   CHOL 145 06/02/2022   TRIG 135 06/02/2022   HDL 56 06/02/2022   LDLCALC 66 06/02/2022   ALT 13 06/02/2022   AST 11 06/02/2022   NA 141 06/02/2022   K 3.9 06/02/2022   CL 98 06/02/2022   CREATININE 0.65 06/02/2022   BUN 16 06/02/2022   CO2 26 06/02/2022   TSH 2.700 06/02/2022   INR 0.92 06/25/2011   HGBA1C 6.9 (H) 06/02/2022   MICROALBUR 30 07/24/2020      Assessment & Plan:   Problem List Items Addressed This Visit       Cardiovascular and Mediastinum   Hypertension associated with diabetes (HCC)    Control: good Recommend check sugars fasting daily. Recommend check feet daily. Recommend annual eye exams. Medicines: Continue  Lisinopril-hydrochlorothiazide 10-12.5 daily and ozempic 2 mg weekly.  Continue to work on eating a healthy diet and exercise.  Labs drawn today.         Relevant Orders   CBC with Differential/Platelet (Completed)   Comprehensive metabolic panel (Completed)     Endocrine   Hypothyroidism    Previously well controlled Continue Synthroid at current dose  Recheck TSH and adjust Synthroid as indicated  Relevant Orders   TSH (Completed)   T4, free (Completed)   Cardiovascular Risk Assessment (Completed)   RESOLVED: Type 2 diabetes mellitus with hyperglycemia, without long-term current use of insulin (HCC)   Relevant Orders   Hemoglobin A1c (Completed)     Musculoskeletal and Integument   Rheumatoid arthritis (HCC)   RESOLVED: Subacromial bursitis of left shoulder joint     Other   Mixed hyperlipidemia    Well controlled.  No changes to medicines. Continue  rosuvastatin 10 mg before bed.  Continue to work on eating a healthy diet and exercise.  Labs drawn today.        Relevant Orders   Lipid panel (Completed)   Morbid obesity (HCC)    Continue healthy diet and exercise.  Recommend continue ozempic.       B12 deficiency    B12 has improved to low normal. Recommend continue cyanocobalamin 1000 mcg/ml injection monthly.       Relevant Orders   Vitamin B12 (Completed)   Vitamin D deficiency    Continue vitamin D one weekly.       Other Visit Diagnoses     Need for influenza vaccination    -  Primary   Relevant Orders   Flu Vaccine MDCK QUAD PF (Completed)     .  No orders of the defined types were placed in this encounter.   Orders Placed This Encounter  Procedures   Flu Vaccine MDCK QUAD PF   CBC with Differential/Platelet   Comprehensive metabolic panel   Hemoglobin A1c   Lipid panel   TSH   T4, free   Vitamin B12   Cardiovascular Risk Assessment     Follow-up: Return in about 3 months (around 09/01/2022) for chronic fasting.  An After Visit Summary was printed and given to the patient.  Blane Ohara, MD [facility] [phone]

## 2022-06-03 LAB — LIPID PANEL
Chol/HDL Ratio: 2.6 ratio (ref 0.0–4.4)
Cholesterol, Total: 145 mg/dL (ref 100–199)
HDL: 56 mg/dL (ref >39)
LDL Chol Calc (NIH): 66 mg/dL (ref 0–99)
Triglycerides: 135 mg/dL (ref 0–149)
VLDL Cholesterol Cal: 23 mg/dL (ref 5–40)

## 2022-06-03 LAB — TSH: TSH: 2.7 u[IU]/mL (ref 0.450–4.500)

## 2022-06-03 LAB — CBC WITH DIFFERENTIAL/PLATELET
Basophils Absolute: 0.1 10*3/uL (ref 0.0–0.2)
Basos: 1 %
EOS (ABSOLUTE): 0.1 10*3/uL (ref 0.0–0.4)
Eos: 1 %
Hematocrit: 41 % (ref 34.0–46.6)
Hemoglobin: 13.5 g/dL (ref 11.1–15.9)
Immature Grans (Abs): 0 10*3/uL (ref 0.0–0.1)
Immature Granulocytes: 0 %
Lymphocytes Absolute: 1.6 10*3/uL (ref 0.7–3.1)
Lymphs: 15 %
MCH: 28.5 pg (ref 26.6–33.0)
MCHC: 32.9 g/dL (ref 31.5–35.7)
MCV: 87 fL (ref 79–97)
Monocytes Absolute: 0.5 10*3/uL (ref 0.1–0.9)
Monocytes: 5 %
Neutrophils Absolute: 7.8 10*3/uL — ABNORMAL HIGH (ref 1.4–7.0)
Neutrophils: 78 %
Platelets: 351 10*3/uL (ref 150–450)
RBC: 4.73 x10E6/uL (ref 3.77–5.28)
RDW: 12.6 % (ref 11.7–15.4)
WBC: 10.1 10*3/uL (ref 3.4–10.8)

## 2022-06-03 LAB — COMPREHENSIVE METABOLIC PANEL
ALT: 13 IU/L (ref 0–32)
AST: 11 IU/L (ref 0–40)
Albumin/Globulin Ratio: 2 (ref 1.2–2.2)
Albumin: 4.4 g/dL (ref 3.9–4.9)
Alkaline Phosphatase: 113 IU/L (ref 44–121)
BUN/Creatinine Ratio: 25 (ref 12–28)
BUN: 16 mg/dL (ref 8–27)
Bilirubin Total: 0.4 mg/dL (ref 0.0–1.2)
CO2: 26 mmol/L (ref 20–29)
Calcium: 9.8 mg/dL (ref 8.7–10.3)
Chloride: 98 mmol/L (ref 96–106)
Creatinine, Ser: 0.65 mg/dL (ref 0.57–1.00)
Globulin, Total: 2.2 g/dL (ref 1.5–4.5)
Glucose: 125 mg/dL — ABNORMAL HIGH (ref 70–99)
Potassium: 3.9 mmol/L (ref 3.5–5.2)
Sodium: 141 mmol/L (ref 134–144)
Total Protein: 6.6 g/dL (ref 6.0–8.5)
eGFR: 99 mL/min/{1.73_m2} (ref >59)

## 2022-06-03 LAB — VITAMIN B12: Vitamin B-12: 369 pg/mL (ref 232–1245)

## 2022-06-03 LAB — HEMOGLOBIN A1C
Est. average glucose Bld gHb Est-mCnc: 151 mg/dL
Hgb A1c MFr Bld: 6.9 % — ABNORMAL HIGH (ref 4.8–5.6)

## 2022-06-03 LAB — T4, FREE: Free T4: 1.66 ng/dL (ref 0.82–1.77)

## 2022-06-03 LAB — CARDIOVASCULAR RISK ASSESSMENT

## 2022-06-03 NOTE — Progress Notes (Signed)
Labs improved.  B12 has improved to low normal. Recommend continue cyanocobalamin 1000 mcg/ml injection monthly.

## 2022-06-07 ENCOUNTER — Encounter: Payer: Self-pay | Admitting: Family Medicine

## 2022-06-07 NOTE — Assessment & Plan Note (Signed)
Well controlled.  No changes to medicines. Continue rosuvastatin 10 mg before bed.  Continue to work on eating a healthy diet and exercise.  Labs drawn today.

## 2022-06-07 NOTE — Assessment & Plan Note (Signed)
B12 has improved to low normal. Recommend continue cyanocobalamin 1000 mcg/ml injection monthly.

## 2022-06-07 NOTE — Assessment & Plan Note (Signed)
Control: good Recommend check sugars fasting daily. Recommend check feet daily. Recommend annual eye exams. Medicines: Continue  Lisinopril-hydrochlorothiazide 10-12.5 daily and ozempic 2 mg weekly.  Continue to work on eating a healthy diet and exercise.  Labs drawn today.

## 2022-06-07 NOTE — Assessment & Plan Note (Signed)
Previously well controlled Continue Synthroid at current dose  Recheck TSH and adjust Synthroid as indicated   

## 2022-06-07 NOTE — Assessment & Plan Note (Signed)
Continue healthy diet and exercise.  Recommend continue ozempic.

## 2022-06-07 NOTE — Assessment & Plan Note (Signed)
Continue vitamin D one weekly.

## 2022-06-09 ENCOUNTER — Other Ambulatory Visit: Payer: Self-pay | Admitting: Family Medicine

## 2022-06-11 ENCOUNTER — Other Ambulatory Visit: Payer: Self-pay

## 2022-06-11 MED ORDER — LEVOTHYROXINE SODIUM 112 MCG PO TABS: 112.0000 ug | ORAL_TABLET | Freq: Every day | ORAL | 1 refills | Status: DC

## 2022-06-11 NOTE — Progress Notes (Signed)
Patient called requesting refill of Levothyroxine 112 be sent to [facility].  Last refilled 02/27/22 #90  0 Refills  Labs done 06/02/22

## 2022-06-12 ENCOUNTER — Other Ambulatory Visit: Payer: Self-pay | Admitting: Nurse Practitioner

## 2022-06-12 DIAGNOSIS — E1165 Type 2 diabetes mellitus with hyperglycemia: Secondary | ICD-10-CM

## 2022-06-15 ENCOUNTER — Other Ambulatory Visit: Payer: Self-pay | Admitting: Family Medicine

## 2022-06-15 DIAGNOSIS — E782 Mixed hyperlipidemia: Secondary | ICD-10-CM

## 2022-06-17 ENCOUNTER — Other Ambulatory Visit: Payer: Self-pay | Admitting: Family Medicine

## 2022-06-17 DIAGNOSIS — E782 Mixed hyperlipidemia: Secondary | ICD-10-CM

## 2022-06-28 ENCOUNTER — Other Ambulatory Visit: Payer: Self-pay | Admitting: Family Medicine

## 2022-07-15 ENCOUNTER — Other Ambulatory Visit: Payer: Self-pay

## 2022-07-15 MED ORDER — AMOXICILLIN 500 MG PO TABS: 500.0000 mg | ORAL_TABLET | Freq: Every day | ORAL | 0 refills | Status: DC

## 2022-07-15 NOTE — Progress Notes (Signed)
Patient is going to the dentist next week and needs the amoxicillin sent in before going to have her teeth checked.

## 2022-08-07 ENCOUNTER — Telehealth: Payer: BC Managed Care – PPO | Admitting: Family Medicine

## 2022-08-07 DIAGNOSIS — J018 Other acute sinusitis: Secondary | ICD-10-CM | POA: Diagnosis not present

## 2022-08-07 DIAGNOSIS — J069 Acute upper respiratory infection, unspecified: Secondary | ICD-10-CM | POA: Diagnosis not present

## 2022-08-07 MED ORDER — FLUTICASONE PROPIONATE 50 MCG/ACT NA SUSP: 2.0000 | Freq: Every day | NASAL | 6 refills | Status: DC

## 2022-08-07 MED ORDER — BENZONATATE 200 MG PO CAPS: 200.0000 mg | ORAL_CAPSULE | Freq: Two times a day (BID) | ORAL | 0 refills | Status: DC | PRN

## 2022-08-07 NOTE — Progress Notes (Signed)

## 2022-08-13 ENCOUNTER — Ambulatory Visit: Payer: BC Managed Care – PPO | Admitting: Obstetrics and Gynecology

## 2022-09-03 NOTE — Progress Notes (Signed)
Subjective:  Patient ID: Kathleen Russo, female    DOB: Nov 20, 1960  Age: 62 y.o. MRN: [id number]  Chief Complaint  Patient presents with   Hyperlipidemia   Hypertension   Hypothyroidism   HPI:   Diabetes:  Complications: Glucose checking:  three to four times weekly  Glucose logs: Hypoglycemia: none, 80-128 Most recent A1C: 6.9 Current medications: ozempic 2 mg weekly  Last Eye Exam: few months ago.  Foot checks: daily    Hyperlipidemia: Current medications: Rosuvastatin 10 mg daily    Hypertension: Current medications: Lisinopril-hydrochlorothiazide 10-12.5 daily   Hypothyroidism: on synthroid 112 mcg once daily in am.   RA: [facility] rheumatology following. She was on mtx and folate, but these were stopped. Rheumatology stopped meds and are monitoring.   Vitamin D deficiency: taking vitamin D once weekly.   Vitamin B12 deficiency: Monthly injections.    Diet: healthy Exercise: walking.         09/04/2022    7:57 AM 07/28/2021   11:22 AM 04/29/2021    7:23 AM 10/23/2020    7:32 AM 07/24/2020    9:34 AM  Depression screen PHQ 2/9  Decreased Interest 0 0 0 0 0  Down, Depressed, Hopeless 0 0 0 0 0  PHQ - 2 Score 0 0 0 0 0  Altered sleeping   0    Tired, decreased energy   0    Change in appetite   1    Feeling bad or failure about yourself    0    Trouble concentrating   0    Moving slowly or fidgety/restless   0    Suicidal thoughts   0    PHQ-9 Score   1           03/10/2022    3:00 AM 03/10/2022    7:40 AM 03/11/2022   12:00 AM 03/11/2022   10:14 AM 09/04/2022    7:57 AM  Fall Risk  Falls in the past year?     0  Was there an injury with Fall?     0  Fall Risk Category Calculator     0  (RETIRED) Patient Fall Risk Level Moderate fall risk Moderate fall risk Moderate fall risk Moderate fall risk   Patient at Risk for Falls Due to     No Fall Risks  Fall risk Follow up     Falls evaluation completed      Review of Systems  Constitutional:   Negative for chills, fatigue and fever.  HENT:  Negative for congestion, ear pain, rhinorrhea and sore throat.   Respiratory:  Negative for cough and shortness of breath.   Cardiovascular:  Negative for chest pain.  Gastrointestinal:  Negative for abdominal pain, constipation, diarrhea, nausea and vomiting.  Genitourinary:  Negative for dysuria and urgency.  Musculoskeletal:  Positive for arthralgias (Left hip). Negative for back pain and myalgias.  Neurological:  Negative for dizziness, weakness, light-headedness and headaches.  Psychiatric/Behavioral:  Negative for dysphoric mood. The patient is not nervous/anxious.     Current Outpatient Medications on File Prior to Visit  Medication Sig Dispense Refill   acetaminophen (TYLENOL) 500 MG tablet Take 2 tablets (1,000 mg total) by mouth every 6 (six) hours. 30 tablet 0   blood glucose meter kit and supplies KIT Dispense based on patient and insurance preference. Check daily in am. E11.69. 1 each 0   clobetasol ointment (TEMOVATE) 0.05 % Apply 1 application topically 2 (two) times daily. 60 g  1   cyanocobalamin (VITAMIN B12) 1000 MCG/ML injection Inject 1 ml weekly for 4 weeks then 1 ml every 4 weeks. 10 mL 1   cyclobenzaprine (FLEXERIL) 10 MG tablet Take 10 mg by mouth every 6 (six) hours as needed for muscle spasms.     fluticasone (FLONASE) 50 MCG/ACT nasal spray Place 2 sprays into both nostrils daily. 16 g 6   levothyroxine (SYNTHROID) 112 MCG tablet Take 1 tablet (112 mcg total) by mouth daily before breakfast. 90 tablet 1   loratadine (CLARITIN) 10 MG tablet Take 1 tablet (10 mg total) by mouth daily. (Patient taking differently: Take 10 mg by mouth daily as needed for allergies.) 90 tablet 2   montelukast (SINGULAIR) 10 MG tablet TAKE 1 TABLET(10 MG) BY MOUTH AT BEDTIME 90 tablet 3   nystatin (MYCOSTATIN/NYSTOP) powder Apply 1 application. topically 3 (three) times daily. Apply to affected area for up to 7 days 30 g 2   ondansetron  (ZOFRAN) 4 MG tablet Take 4 mg by mouth every 8 (eight) hours as needed for nausea or vomiting.     ONETOUCH ULTRA test strip USE TO CHECK BLOOD SUGAR ONCE DAILY IN THE MORNING 50 each 1   polyethylene glycol powder (MIRALAX) 17 GM/SCOOP powder Take 8.5-34 g by mouth daily. To correct constipation.  Adjust dose over 1-2 months.  Goal = ~1 bowel movement / day     Vitamin D, Ergocalciferol, (DRISDOL) 1.25 MG (50000 UNIT) CAPS capsule TAKE 1 CAPSULE BY MOUTH EVERY 7 DAYS 12 capsule 1   No current facility-administered medications on file prior to visit.   Past Medical History:  Diagnosis Date   Anemia    Arthritis    Cancer (HCC)    UTERINE   Diabetes mellitus    DM2, not requiring meds as of 04/2011   Dizziness    GERD (gastroesophageal reflux disease)    Hemorrhoids    Hyperlipidemia    Hypertension    Hypothyroidism    Lichen sclerosus    PONV (postoperative nausea and vomiting)    Primary osteoarthritis    Sleep apnea    USES CPAP   Swelling of both ankles    Type 2 diabetes mellitus (HCC)    Past Surgical History:  Procedure Laterality Date   ABDOMINAL HYSTERECTOMY     BARIATRIC SURGERY     BREAST LUMPECTOMY Left    BREAST SURGERY  1979   ELBOW SURGERY  1990'S   GASTRIC BYPASS  2007   INCISION AND DRAINAGE PERIRECTAL ABSCESS N/A 03/09/2022   Procedure: IRRIGATION AND DEBRIDEMENT PERIRECTAL ABSCESS;  Surgeon: Emelia Loron, MD;  Location: [facility];  Service: General;  Laterality: N/A;   JOINT REPLACEMENT  2002   RT. KNEE   KNEE ARTHROSCOPY  1993   LT. KNEE   KNEE ARTHROSCOPY W/ LATERAL RELEASE  1987   RT. KNEE   left hip replacement  05/2017   SHOULDER ARTHROSCOPY Right 05/22/2022   SHOULDER SURGERY Right 01/20/2022   rotator cuff repair   TONSILLECTOMY     TOTAL KNEE ARTHROPLASTY  07/06/2011   Procedure: TOTAL KNEE ARTHROPLASTY;  Surgeon: Raymon Mutton, MD;  Location: [facility];  Service: Orthopedics;  Laterality: Left;    Family History  Problem Relation Age  of Onset   Hypertension Mother    Thyroid disease Mother    Diabetes Mother    Other Father        Wegoners disease   Thyroid disease Sister    Diabetes Sister  Breast cancer Daughter    Breast cancer Maternal Aunt    Breast cancer Paternal Aunt    Graves' disease Brother    Social History   Socioeconomic History   Marital status: Married    Spouse name: Not on file   Number of children: Not on file   Years of education: Not on file   Highest education level: Not on file  Occupational History   Occupation: retired    Comment: [employer]  Tobacco Use   Smoking status: Never   Smokeless tobacco: Never  Vaping Use   Vaping Use: Never used  Substance and Sexual Activity   Alcohol use: No   Drug use: No   Sexual activity: Yes    Partners: Male    Birth control/protection: Surgical    Comment: hysterectomy  Other Topics Concern   Not on file  Social History Narrative   Not on file   Social Determinants of Health   Financial Resource Strain: Low Risk  (09/04/2022)   Overall Financial Resource Strain (CARDIA)    Difficulty of Paying Living Expenses: Not hard at all  Food Insecurity: No Food Insecurity (03/10/2022)   Hunger Vital Sign    Worried About Running Out of Food in the Last Year: Never true    Ran Out of Food in the Last Year: Never true  Transportation Needs: No Transportation Needs (03/10/2022)   PRAPARE - Administrator, Civil Service (Medical): No    Lack of Transportation (Non-Medical): No  Physical Activity: Inactive (09/04/2022)   Exercise Vital Sign    Days of Exercise per Week: 0 days    Minutes of Exercise per Session: 0 min  Stress: No Stress Concern Present (09/04/2022)   [employer] of Occupational Health - Occupational Stress Questionnaire    Feeling of Stress : Not at all  Social Connections: Moderately Integrated (09/04/2022)   Social Connection and Isolation Panel [NHANES]    Frequency of Communication with Friends and Family:  Three times a week    Frequency of Social Gatherings with Friends and Family: Three times a week    Attends Religious Services: More than 4 times per year    Active Member of Clubs or Organizations: No    Attends Banker Meetings: Never    Marital Status: Married    Objective:  BP 132/68   Pulse 69   Temp (!) 97.2 F (36.2 C)   Ht 5\' 5"  (1.651 m)   Wt 248 lb (112.5 kg)   SpO2 98%   BMI 41.27 kg/m      09/04/2022    7:55 AM 06/02/2022    8:31 AM 04/28/2022    3:00 PM  BP/Weight  Systolic BP 132 110 124  Diastolic BP 68 70 72  Wt. (Lbs) 248 247 252  BMI 41.27 kg/m2 41.1 kg/m2 41.93 kg/m2    Physical Exam Vitals reviewed.  Constitutional:      Appearance: Normal appearance. She is obese.  Neck:     Vascular: No carotid bruit.  Cardiovascular:     Rate and Rhythm: Normal rate and regular rhythm.     Heart sounds: Normal heart sounds.  Pulmonary:     Effort: Pulmonary effort is normal. No respiratory distress.     Breath sounds: Normal breath sounds.  Abdominal:     General: Abdomen is flat. Bowel sounds are normal.     Palpations: Abdomen is soft.     Tenderness: There is no abdominal tenderness.  Neurological:     Mental Status: She is alert and oriented to person, place, and time.  Psychiatric:        Mood and Affect: Mood normal.        Behavior: Behavior normal.     Diabetic Foot Exam - Simple   Simple Foot Form Diabetic Foot exam was performed with the following findings: Yes 09/04/2022  8:18 AM  Visual Inspection See comments: Yes Sensation Testing Intact to touch and monofilament testing bilaterally: Yes Pulse Check Posterior Tibialis and Dorsalis pulse intact bilaterally: Yes Comments Flat feet BL.       Lab Results  Component Value Date   WBC 7.5 09/04/2022   HGB 13.1 09/04/2022   HCT 40.6 09/04/2022   PLT 325 09/04/2022   GLUCOSE 122 (H) 09/04/2022   CHOL 132 09/04/2022   TRIG 164 (H) 09/04/2022   HDL 48 09/04/2022    LDLCALC 56 09/04/2022   ALT 8 09/04/2022   AST 10 09/04/2022   NA 143 09/04/2022   K 4.4 09/04/2022   CL 104 09/04/2022   CREATININE 0.54 (L) 09/04/2022   BUN 13 09/04/2022   CO2 25 09/04/2022   TSH 2.700 06/02/2022   INR 0.92 06/25/2011   HGBA1C 6.6 (H) 09/04/2022   MICROALBUR 30 07/24/2020      Assessment & Plan:    Type 2 diabetes mellitus with hyperglycemia, without long-term current use of insulin (HCC) -     CBC with Differential/Platelet -     Hemoglobin A1c -     Microalbumin / creatinine urine ratio -     Ozempic (2 MG/DOSE); INJECT 2MG  UNDER SKIN EVERY WEEK  Dispense: 9 mL; Refill: 0  Hypertension associated with diabetes (HCC) Assessment & Plan: Well controlled.  No changes to medicines. Lisinopril-hydrochlorothiazide 10-12.5 daily  Continue to work on eating a healthy diet and exercise.  Labs drawn today.    Orders: -     Comprehensive metabolic panel  Hypothyroidism, unspecified type Assessment & Plan: Previously well controlled Continue Synthroid at current dose  Recheck TSH and adjust Synthroid as indicated     Mixed hyperlipidemia Assessment & Plan: Well controlled.  No changes to medicines. Continue rosuvastatin 10 mg before bed.  Continue to work on eating a healthy diet and exercise.  Labs drawn today.    Orders: -     Lipid panel  Vitamin D deficiency Assessment & Plan: The current medical regimen is effective;  continue present plan and medications.    B12 deficiency Assessment & Plan: Recommend continue cyanocobalamin 1000 mcg/ml injection monthly.    Rheumatoid arthritis involving multiple sites with positive rheumatoid factor (HCC) Assessment & Plan: Management per specialist.  She was on mtx and folate, but these were stopped by Rheumatology    Other orders -     Cardiovascular Risk Assessment     Meds ordered this encounter  Medications   Semaglutide, 2 MG/DOSE, (OZEMPIC, 2 MG/DOSE,) 8 MG/3ML SOPN    Sig: INJECT  2MG  UNDER SKIN EVERY WEEK    Dispense:  9 mL    Refill:  0    Orders Placed This Encounter  Procedures   CBC with Differential/Platelet   Comprehensive metabolic panel   Hemoglobin A1c   Lipid panel   Microalbumin / creatinine urine ratio   Cardiovascular Risk Assessment     Follow-up: Return in about 3 months (around 12/05/2022) for chronic fasting.   I,Katherina A Bramblett,acting as a scribe for Blane Ohara, MD.,have documented all relevant  documentation on the behalf of Blane Ohara, MD,as directed by  Blane Ohara, MD while in the presence of Blane Ohara, MD.   Clayborn Bigness I Leal-Borjas,acting as a scribe for Blane Ohara, MD.,have documented all relevant documentation on the behalf of Blane Ohara, MD,as directed by  Blane Ohara, MD while in the presence of Blane Ohara, MD.     An After Visit Summary was printed and given to the patient.  Blane Ohara, MD [facility] [phone]

## 2022-09-04 ENCOUNTER — Ambulatory Visit: Payer: BC Managed Care – PPO | Admitting: Family Medicine

## 2022-09-04 ENCOUNTER — Encounter: Payer: Self-pay | Admitting: Family Medicine

## 2022-09-04 VITALS — BP 132/68 | HR 69 | Temp 97.2°F | Ht 65.0 in | Wt 248.0 lb

## 2022-09-04 DIAGNOSIS — E538 Deficiency of other specified B group vitamins: Secondary | ICD-10-CM

## 2022-09-04 DIAGNOSIS — E782 Mixed hyperlipidemia: Secondary | ICD-10-CM | POA: Diagnosis not present

## 2022-09-04 DIAGNOSIS — E1159 Type 2 diabetes mellitus with other circulatory complications: Secondary | ICD-10-CM | POA: Diagnosis not present

## 2022-09-04 DIAGNOSIS — E039 Hypothyroidism, unspecified: Secondary | ICD-10-CM | POA: Diagnosis not present

## 2022-09-04 DIAGNOSIS — I152 Hypertension secondary to endocrine disorders: Secondary | ICD-10-CM

## 2022-09-04 DIAGNOSIS — E559 Vitamin D deficiency, unspecified: Secondary | ICD-10-CM

## 2022-09-04 DIAGNOSIS — E1165 Type 2 diabetes mellitus with hyperglycemia: Secondary | ICD-10-CM | POA: Diagnosis not present

## 2022-09-04 DIAGNOSIS — M0579 Rheumatoid arthritis with rheumatoid factor of multiple sites without organ or systems involvement: Secondary | ICD-10-CM

## 2022-09-04 MED ORDER — OZEMPIC (2 MG/DOSE) 8 MG/3ML ~~LOC~~ SOPN: PEN_INJECTOR | SUBCUTANEOUS | 0 refills | Status: DC

## 2022-09-05 ENCOUNTER — Other Ambulatory Visit: Payer: Self-pay | Admitting: Family Medicine

## 2022-09-06 LAB — COMPREHENSIVE METABOLIC PANEL
ALT: 8 IU/L (ref 0–32)
AST: 10 IU/L (ref 0–40)
Albumin/Globulin Ratio: 2.3 — ABNORMAL HIGH (ref 1.2–2.2)
Albumin: 4.4 g/dL (ref 3.9–4.9)
Alkaline Phosphatase: 91 IU/L (ref 44–121)
BUN/Creatinine Ratio: 24 (ref 12–28)
BUN: 13 mg/dL (ref 8–27)
Bilirubin Total: 0.3 mg/dL (ref 0.0–1.2)
CO2: 25 mmol/L (ref 20–29)
Calcium: 9.1 mg/dL (ref 8.7–10.3)
Chloride: 104 mmol/L (ref 96–106)
Creatinine, Ser: 0.54 mg/dL — ABNORMAL LOW (ref 0.57–1.00)
Globulin, Total: 1.9 g/dL (ref 1.5–4.5)
Glucose: 122 mg/dL — ABNORMAL HIGH (ref 70–99)
Potassium: 4.4 mmol/L (ref 3.5–5.2)
Sodium: 143 mmol/L (ref 134–144)
Total Protein: 6.3 g/dL (ref 6.0–8.5)
eGFR: 104 mL/min/{1.73_m2} (ref >59)

## 2022-09-06 LAB — CBC WITH DIFFERENTIAL/PLATELET
Basophils Absolute: 0 10*3/uL (ref 0.0–0.2)
Basos: 1 %
EOS (ABSOLUTE): 0.1 10*3/uL (ref 0.0–0.4)
Eos: 1 %
Hematocrit: 40.6 % (ref 34.0–46.6)
Hemoglobin: 13.1 g/dL (ref 11.1–15.9)
Immature Grans (Abs): 0 10*3/uL (ref 0.0–0.1)
Immature Granulocytes: 1 %
Lymphocytes Absolute: 1.6 10*3/uL (ref 0.7–3.1)
Lymphs: 21 %
MCH: 28 pg (ref 26.6–33.0)
MCHC: 32.3 g/dL (ref 31.5–35.7)
MCV: 87 fL (ref 79–97)
Monocytes Absolute: 0.5 10*3/uL (ref 0.1–0.9)
Monocytes: 6 %
Neutrophils Absolute: 5.2 10*3/uL (ref 1.4–7.0)
Neutrophils: 70 %
Platelets: 325 10*3/uL (ref 150–450)
RBC: 4.68 x10E6/uL (ref 3.77–5.28)
RDW: 13.1 % (ref 11.7–15.4)
WBC: 7.5 10*3/uL (ref 3.4–10.8)

## 2022-09-06 LAB — MICROALBUMIN / CREATININE URINE RATIO
Creatinine, Urine: 154.2 mg/dL
Microalb/Creat Ratio: 8 mg/g creat (ref 0–29)
Microalbumin, Urine: 11.7 ug/mL

## 2022-09-06 LAB — LIPID PANEL
Chol/HDL Ratio: 2.8 ratio (ref 0.0–4.4)
Cholesterol, Total: 132 mg/dL (ref 100–199)
HDL: 48 mg/dL (ref >39)
LDL Chol Calc (NIH): 56 mg/dL (ref 0–99)
Triglycerides: 164 mg/dL — ABNORMAL HIGH (ref 0–149)
VLDL Cholesterol Cal: 28 mg/dL (ref 5–40)

## 2022-09-06 LAB — CARDIOVASCULAR RISK ASSESSMENT

## 2022-09-06 LAB — HEMOGLOBIN A1C
Est. average glucose Bld gHb Est-mCnc: 143 mg/dL
Hgb A1c MFr Bld: 6.6 % — ABNORMAL HIGH (ref 4.8–5.6)

## 2022-09-09 ENCOUNTER — Other Ambulatory Visit: Payer: Self-pay | Admitting: Family Medicine

## 2022-09-09 DIAGNOSIS — E782 Mixed hyperlipidemia: Secondary | ICD-10-CM

## 2022-09-10 NOTE — Assessment & Plan Note (Signed)
Well controlled.  No changes to medicines. Lisinopril-hydrochlorothiazide 10-12.5 daily  Continue to work on eating a healthy diet and exercise.  Labs drawn today.

## 2022-09-10 NOTE — Assessment & Plan Note (Signed)
The current medical regimen is effective;  continue present plan and medications.  

## 2022-09-10 NOTE — Assessment & Plan Note (Signed)
Well controlled.  No changes to medicines. Continue rosuvastatin 10 mg before bed.  Continue to work on eating a healthy diet and exercise.  Labs drawn today.   

## 2022-09-10 NOTE — Assessment & Plan Note (Signed)
Previously well controlled Continue Synthroid at current dose  Recheck TSH and adjust Synthroid as indicated   

## 2022-09-10 NOTE — Assessment & Plan Note (Signed)
Management per specialist.  She was on mtx and folate, but these were stopped by Rheumatology

## 2022-09-10 NOTE — Assessment & Plan Note (Signed)
Recommend continue cyanocobalamin 1000 mcg/ml injection monthly.

## 2022-09-13 NOTE — Assessment & Plan Note (Signed)
Bmi 41.  Recommend continue to work on eating healthy diet and exercise.

## 2022-09-23 ENCOUNTER — Telehealth: Payer: Self-pay | Admitting: Obstetrics and Gynecology

## 2022-09-23 NOTE — Telephone Encounter (Signed)
Patient may wish to have her PCP order the bone density test.

## 2022-09-23 NOTE — Telephone Encounter (Signed)
Please contact patient to schedule an annual exam with me.   [facility]  I will need to see her for an update annual exam prior to ordering this test.   Thank you.

## 2022-09-23 NOTE — Telephone Encounter (Signed)
Deborha Payment, Dondra Prader, CMA Patient does not want to schedule appointment.

## 2022-09-24 ENCOUNTER — Other Ambulatory Visit: Payer: Self-pay

## 2022-09-24 DIAGNOSIS — Z9189 Other specified personal risk factors, not elsewhere classified: Secondary | ICD-10-CM

## 2022-09-27 NOTE — Telephone Encounter (Signed)
Encounter reviewed and closed.  

## 2022-09-28 ENCOUNTER — Ambulatory Visit
Admission: RE | Admit: 2022-09-28 | Discharge: 2022-09-28 | Disposition: A | Discharge status: Home / Self Care | Payer: BC Managed Care – PPO | Source: Ambulatory Visit | Attending: Family Medicine | Admitting: Family Medicine

## 2022-09-28 ENCOUNTER — Inpatient Hospital Stay: Admission: RE | Admit: 2022-09-28 | Payer: BC Managed Care – PPO | Source: Ambulatory Visit

## 2022-09-28 DIAGNOSIS — Z9189 Other specified personal risk factors, not elsewhere classified: Secondary | ICD-10-CM

## 2022-09-30 ENCOUNTER — Other Ambulatory Visit: Payer: BC Managed Care – PPO

## 2022-12-01 ENCOUNTER — Other Ambulatory Visit: Payer: Self-pay

## 2022-12-01 DIAGNOSIS — J301 Allergic rhinitis due to pollen: Secondary | ICD-10-CM

## 2022-12-01 MED ORDER — LORATADINE 10 MG PO TABS
10.0000 mg | ORAL_TABLET | Freq: Every day | ORAL | 1 refills | Status: DC | PRN
Start: 2022-12-01 — End: 2023-09-02

## 2022-12-04 ENCOUNTER — Other Ambulatory Visit: Payer: Self-pay | Admitting: Family Medicine

## 2022-12-04 DIAGNOSIS — E782 Mixed hyperlipidemia: Secondary | ICD-10-CM

## 2022-12-06 ENCOUNTER — Other Ambulatory Visit: Payer: Self-pay | Admitting: Family Medicine

## 2022-12-08 ENCOUNTER — Telehealth: Payer: Self-pay

## 2022-12-08 NOTE — Telephone Encounter (Signed)
PA submitted and approved via covermymeds for Ozempic. °

## 2022-12-15 ENCOUNTER — Other Ambulatory Visit: Payer: Self-pay

## 2022-12-17 ENCOUNTER — Other Ambulatory Visit: Payer: Self-pay | Admitting: Family Medicine

## 2022-12-17 MED ORDER — MELOXICAM 15 MG PO TABS
15.0000 mg | ORAL_TABLET | Freq: Every day | ORAL | 0 refills | Status: DC
Start: 2022-12-17 — End: 2023-05-06

## 2022-12-17 MED ORDER — SCOPOLAMINE 1 MG/3DAYS TD PT72: 1.0000 | MEDICATED_PATCH | TRANSDERMAL | 0 refills | Status: DC

## 2022-12-22 ENCOUNTER — Ambulatory Visit: Payer: BC Managed Care – PPO | Admitting: Family Medicine

## 2023-01-14 NOTE — Assessment & Plan Note (Signed)
Previously well controlled Continue Synthroid at current dose  Recheck TSH and adjust Synthroid as indicated   

## 2023-01-14 NOTE — Assessment & Plan Note (Addendum)
Diabetes and hypertension well controlled.  No changes to medicines. Lisinopril-hydrochlorothiazide 10-12.5 daily and ozempic 2 mg weekly.  Continue to work on eating a healthy diet and exercise.  Labs drawn today.

## 2023-01-14 NOTE — Progress Notes (Unsigned)
Subjective:  Patient ID: Kathleen Russo, female    DOB: 01-20-1961  Age: 62 y.o. MRN: [id number]  Chief Complaint  Patient presents with   Medical Management of Chronic Issues    HPI Diabetes:  Complications:hypoglycemia. Glucose checking:  three to four times weekly  Glucose logs: Hypoglycemia: none, 80-150 Most recent A1C: 6.6 Current medications: ozempic 2 mg weekly  Last Eye Exam: few months ago.  Foot checks: daily    Hyperlipidemia: Current medications: Rosuvastatin 10 mg daily    Hypertension: Current medications: Lisinopril-hydrochlorothiazide 10-12.5 daily   Hypothyroidism: on synthroid 112 mcg once daily in am.   RA: [facility] rheumatology following. She was on mtx and folate, but these were stopped. Rheumatology stopped meds and are monitoring.   Vitamin D deficiency: taking vitamin D once weekly.   Vitamin B12 deficiency: Monthly injections.    Diet: healthy Exercise: walking.      01/15/2023    7:29 AM 09/04/2022    7:57 AM 07/28/2021   11:22 AM 04/29/2021    7:23 AM 10/23/2020    7:32 AM  Depression screen PHQ 2/9  Decreased Interest 0 0 0 0 0  Down, Depressed, Hopeless 0 0 0 0 0  PHQ - 2 Score 0 0 0 0 0  Altered sleeping 0   0   Tired, decreased energy 0   0   Change in appetite 0   1   Feeling bad or failure about yourself  0   0   Trouble concentrating 0   0   Moving slowly or fidgety/restless 0   0   Suicidal thoughts 0   0   PHQ-9 Score 0   1   Difficult doing work/chores Not difficult at all            01/15/2023    7:29 AM  Fall Risk   Falls in the past year? 0  Number falls in past yr: 0  Injury with Fall? 0  Risk for fall due to : No Fall Risks  Follow up Falls evaluation completed;Falls prevention discussed    Patient Care Team: Raynell Upton, Fritzi Mandes, MD as PCP - General (Family Medicine)   Review of Systems  Constitutional:  Negative for chills, fatigue and fever.  HENT:  Negative for congestion, rhinorrhea and sore throat.    Respiratory:  Negative for cough and shortness of breath.   Cardiovascular:  Negative for chest pain.  Gastrointestinal:  Negative for abdominal pain, constipation, diarrhea, nausea and vomiting.  Genitourinary:  Negative for dysuria and urgency.  Musculoskeletal:  Negative for back pain and myalgias.  Neurological:  Negative for dizziness, weakness, light-headedness and headaches.  Psychiatric/Behavioral:  Negative for dysphoric mood. The patient is not nervous/anxious.     Current Outpatient Medications on File Prior to Visit  Medication Sig Dispense Refill   acetaminophen (TYLENOL) 500 MG tablet Take 2 tablets (1,000 mg total) by mouth every 6 (six) hours. 30 tablet 0   blood glucose meter kit and supplies KIT Dispense based on patient and insurance preference. Check daily in am. E11.69. 1 each 0   clobetasol ointment (TEMOVATE) 0.05 % Apply 1 application topically 2 (two) times daily. 60 g 1   cyanocobalamin (VITAMIN B12) 1000 MCG/ML injection Inject 1 ml weekly for 4 weeks then 1 ml every 4 weeks. 10 mL 1   cyclobenzaprine (FLEXERIL) 10 MG tablet Take 10 mg by mouth every 6 (six) hours as needed for muscle spasms.     fluticasone (FLONASE) 50  MCG/ACT nasal spray Place 2 sprays into both nostrils daily. 16 g 6   levothyroxine (SYNTHROID) 112 MCG tablet TAKE 1 TABLET(112 MCG) BY MOUTH DAILY BEFORE BREAKFAST 90 tablet 1   lisinopril-hydrochlorothiazide (ZESTORETIC) 10-12.5 MG tablet TAKE 1 TABLET BY MOUTH EVERY DAY 90 tablet 1   loratadine (CLARITIN) 10 MG tablet Take 1 tablet (10 mg total) by mouth daily as needed for allergies. 90 tablet 1   meloxicam (MOBIC) 15 MG tablet Take 1 tablet (15 mg total) by mouth daily. 90 tablet 0   montelukast (SINGULAIR) 10 MG tablet TAKE 1 TABLET(10 MG) BY MOUTH AT BEDTIME 90 tablet 3   nystatin (MYCOSTATIN/NYSTOP) powder Apply 1 application. topically 3 (three) times daily. Apply to affected area for up to 7 days 30 g 2   ondansetron (ZOFRAN) 4 MG tablet  Take 4 mg by mouth every 8 (eight) hours as needed for nausea or vomiting.     ONETOUCH ULTRA test strip USE TO CHECK BLOOD SUGAR ONCE DAILY IN THE MORNING 50 each 1   polyethylene glycol powder (MIRALAX) 17 GM/SCOOP powder Take 8.5-34 g by mouth daily. To correct constipation.  Adjust dose over 1-2 months.  Goal = ~1 bowel movement / day     rosuvastatin (CRESTOR) 10 MG tablet TAKE 1 TABLET(10 MG) BY MOUTH DAILY 90 tablet 0   Semaglutide, 2 MG/DOSE, (OZEMPIC, 2 MG/DOSE,) 8 MG/3ML SOPN INJECT 2MG  UNDER SKIN EVERY WEEK 9 mL 0   Vitamin D, Ergocalciferol, (DRISDOL) 1.25 MG (50000 UNIT) CAPS capsule TAKE 1 CAPSULE BY MOUTH EVERY 7 DAYS 12 capsule 1   No current facility-administered medications on file prior to visit.   Past Medical History:  Diagnosis Date   Anemia    Arthritis    Cancer (HCC)    UTERINE   Diabetes mellitus    DM2, not requiring meds as of 04/2011   Dizziness    GERD (gastroesophageal reflux disease)    Hemorrhoids    Hyperlipidemia    Hypertension    Hypothyroidism    Lichen sclerosus    PONV (postoperative nausea and vomiting)    Primary osteoarthritis    Sleep apnea    USES CPAP   Swelling of both ankles    Type 2 diabetes mellitus (HCC)    Past Surgical History:  Procedure Laterality Date   ABDOMINAL HYSTERECTOMY     BARIATRIC SURGERY     BREAST LUMPECTOMY Left    BREAST SURGERY  1979   ELBOW SURGERY  1990'S   GASTRIC BYPASS  2007   INCISION AND DRAINAGE PERIRECTAL ABSCESS N/A 03/09/2022   Procedure: IRRIGATION AND DEBRIDEMENT PERIRECTAL ABSCESS;  Surgeon: Emelia Loron, MD;  Location: [facility];  Service: General;  Laterality: N/A;   JOINT REPLACEMENT  2002   RT. KNEE   KNEE ARTHROSCOPY  1993   LT. KNEE   KNEE ARTHROSCOPY W/ LATERAL RELEASE  1987   RT. KNEE   left hip replacement  05/2017   SHOULDER ARTHROSCOPY Right 05/22/2022   SHOULDER SURGERY Right 01/20/2022   rotator cuff repair   TONSILLECTOMY     TOTAL KNEE ARTHROPLASTY  07/06/2011    Procedure: TOTAL KNEE ARTHROPLASTY;  Surgeon: Raymon Mutton, MD;  Location: [facility];  Service: Orthopedics;  Laterality: Left;    Family History  Problem Relation Age of Onset   Hypertension Mother    Thyroid disease Mother    Diabetes Mother    Other Father        Wegoners disease  Thyroid disease Sister    Diabetes Sister    Breast cancer Daughter    Breast cancer Maternal Aunt    Breast cancer Paternal Aunt    Graves' disease Brother    Social History   Socioeconomic History   Marital status: Married    Spouse name: Not on file   Number of children: Not on file   Years of education: Not on file   Highest education level: Not on file  Occupational History   Occupation: retired    Comment: [employer]  Tobacco Use   Smoking status: Never   Smokeless tobacco: Never  Vaping Use   Vaping status: Never Used  Substance and Sexual Activity   Alcohol use: No   Drug use: No   Sexual activity: Yes    Partners: Male    Birth control/protection: Surgical    Comment: hysterectomy  Other Topics Concern   Not on file  Social History Narrative   Not on file   Social Determinants of Health   Financial Resource Strain: Low Risk  (09/04/2022)   Overall Financial Resource Strain (CARDIA)    Difficulty of Paying Living Expenses: Not hard at all  Food Insecurity: No Food Insecurity (03/10/2022)   Hunger Vital Sign    Worried About Running Out of Food in the Last Year: Never true    Ran Out of Food in the Last Year: Never true  Transportation Needs: No Transportation Needs (03/10/2022)   PRAPARE - Administrator, Civil Service (Medical): No    Lack of Transportation (Non-Medical): No  Physical Activity: Inactive (09/04/2022)   Exercise Vital Sign    Days of Exercise per Week: 0 days    Minutes of Exercise per Session: 0 min  Stress: No Stress Concern Present (09/04/2022)   [employer] of Occupational Health - Occupational Stress Questionnaire    Feeling of Stress : Not  at all  Social Connections: Moderately Integrated (09/04/2022)   Social Connection and Isolation Panel [NHANES]    Frequency of Communication with Friends and Family: Three times a week    Frequency of Social Gatherings with Friends and Family: Three times a week    Attends Religious Services: More than 4 times per year    Active Member of Clubs or Organizations: No    Attends Banker Meetings: Never    Marital Status: Married    Objective:  BP 110/72   Pulse 72   Temp (!) 96 F (35.6 C)   Resp 16   Ht 5\' 5"  (1.651 m)   Wt 250 lb (113.4 kg)   BMI 41.60 kg/m      01/15/2023    7:26 AM 09/04/2022    7:55 AM 06/02/2022    8:31 AM  BP/Weight  Systolic BP 110 132 110  Diastolic BP 72 68 70  Wt. (Lbs) 250 248 247  BMI 41.6 kg/m2 41.27 kg/m2 41.1 kg/m2    Physical Exam Vitals reviewed.  Constitutional:      Appearance: Normal appearance. She is normal weight.  Neck:     Vascular: No carotid bruit.  Cardiovascular:     Rate and Rhythm: Normal rate and regular rhythm.     Heart sounds: Normal heart sounds.  Pulmonary:     Effort: Pulmonary effort is normal. No respiratory distress.     Breath sounds: Normal breath sounds.  Abdominal:     General: Abdomen is flat. Bowel sounds are normal.     Palpations: Abdomen is  soft.     Tenderness: There is no abdominal tenderness.  Neurological:     Mental Status: She is alert and oriented to person, place, and time.  Psychiatric:        Mood and Affect: Mood normal.        Behavior: Behavior normal.     Diabetic Foot Exam - Simple   Simple Foot Form Visual Inspection See comments: Yes Sensation Testing Intact to touch and monofilament testing bilaterally: Yes Pulse Check Posterior Tibialis and Dorsalis pulse intact bilaterally: Yes Comments Bunion right foot.       Lab Results  Component Value Date   WBC 7.0 01/15/2023   HGB 13.3 01/15/2023   HCT 39.8 01/15/2023   PLT 332 01/15/2023   GLUCOSE 110 (H)  01/15/2023   CHOL 120 01/15/2023   TRIG 115 01/15/2023   HDL 47 01/15/2023   LDLCALC 52 01/15/2023   ALT 10 01/15/2023   AST 17 01/15/2023   NA 142 01/15/2023   K 4.5 01/15/2023   CL 101 01/15/2023   CREATININE 0.65 01/15/2023   BUN 14 01/15/2023   CO2 25 01/15/2023   TSH 8.440 (H) 01/15/2023   INR 0.92 06/25/2011   HGBA1C 6.6 (H) 09/04/2022   MICROALBUR 30 07/24/2020      Assessment & Plan:    Hypertension associated with diabetes (HCC) Assessment & Plan: Diabetes and hypertension well controlled.  No changes to medicines. Lisinopril-hydrochlorothiazide 10-12.5 daily and ozempic 2 mg weekly.  Continue to work on eating a healthy diet and exercise.  Labs drawn today.    Orders: -     CBC with Differential/Platelet -     Comprehensive metabolic panel  Mixed hyperlipidemia Assessment & Plan: Well controlled.  No changes to medicines. Continue rosuvastatin 10 mg before bed.  Continue to work on eating a healthy diet and exercise.  Labs drawn today.    Orders: -     Lipid panel  Other specified hypothyroidism Assessment & Plan: Previously well controlled Continue Synthroid at current dose  Recheck TSH and adjust Synthroid as indicated    Orders: -     TSH  Need for shingles vaccine -     Varicella-zoster vaccine IM  Class 3 severe obesity due to excess calories with serious comorbidity and body mass index (BMI) of 40.0 to 44.9 in adult [facility]) Assessment & Plan: Continue ozempic.  Recommend continue to work on eating healthy diet and exercise.       No orders of the defined types were placed in this encounter.   Orders Placed This Encounter  Procedures   Zoster Recombinant (Shingrix )   CBC with Differential/Platelet   Comprehensive metabolic panel   Lipid panel   TSH     Follow-up: Return in about 3 months (around 04/17/2023) for chronic fasting.   I,Marla I Leal-Borjas,acting as a scribe for Blane Ohara, MD.,have documented all relevant  documentation on the behalf of Blane Ohara, MD,as directed by  Blane Ohara, MD while in the presence of Blane Ohara, MD.   An After Visit Summary was printed and given to the patient.  Blane Ohara, MD [facility] [phone]

## 2023-01-14 NOTE — Assessment & Plan Note (Signed)
Well controlled.  No changes to medicines. Continue rosuvastatin 10 mg before bed  Continue to work on eating a healthy diet and exercise.  Labs drawn today.   

## 2023-01-15 ENCOUNTER — Ambulatory Visit: Payer: BC Managed Care – PPO | Admitting: Family Medicine

## 2023-01-15 VITALS — BP 110/72 | HR 72 | Temp 96.0°F | Resp 16 | Ht 65.0 in | Wt 250.0 lb

## 2023-01-15 DIAGNOSIS — E038 Other specified hypothyroidism: Secondary | ICD-10-CM

## 2023-01-15 DIAGNOSIS — E782 Mixed hyperlipidemia: Secondary | ICD-10-CM

## 2023-01-15 DIAGNOSIS — Z23 Encounter for immunization: Secondary | ICD-10-CM

## 2023-01-15 DIAGNOSIS — E1159 Type 2 diabetes mellitus with other circulatory complications: Secondary | ICD-10-CM

## 2023-01-15 DIAGNOSIS — I152 Hypertension secondary to endocrine disorders: Secondary | ICD-10-CM

## 2023-01-15 DIAGNOSIS — Z6841 Body Mass Index (BMI) 40.0 and over, adult: Secondary | ICD-10-CM

## 2023-01-15 LAB — COMPREHENSIVE METABOLIC PANEL
ALT: 10 IU/L (ref 0–32)
AST: 17 IU/L (ref 0–40)
Albumin: 4.3 g/dL (ref 3.9–4.9)
Alkaline Phosphatase: 100 IU/L (ref 44–121)
BUN/Creatinine Ratio: 22 (ref 12–28)
BUN: 14 mg/dL (ref 8–27)
Bilirubin Total: 0.4 mg/dL (ref 0.0–1.2)
CO2: 25 mmol/L (ref 20–29)
Calcium: 9.4 mg/dL (ref 8.7–10.3)
Chloride: 101 mmol/L (ref 96–106)
Creatinine, Ser: 0.65 mg/dL (ref 0.57–1.00)
Globulin, Total: 2.2 g/dL (ref 1.5–4.5)
Glucose: 110 mg/dL — ABNORMAL HIGH (ref 70–99)
Potassium: 4.5 mmol/L (ref 3.5–5.2)
Sodium: 142 mmol/L (ref 134–144)
Total Protein: 6.5 g/dL (ref 6.0–8.5)
eGFR: 99 mL/min/{1.73_m2} (ref >59)

## 2023-01-15 LAB — CBC WITH DIFFERENTIAL/PLATELET
Basophils Absolute: 0 10*3/uL (ref 0.0–0.2)
Basos: 1 %
EOS (ABSOLUTE): 0.2 10*3/uL (ref 0.0–0.4)
Eos: 2 %
Hematocrit: 39.8 % (ref 34.0–46.6)
Hemoglobin: 13.3 g/dL (ref 11.1–15.9)
Immature Grans (Abs): 0 10*3/uL (ref 0.0–0.1)
Immature Granulocytes: 0 %
Lymphocytes Absolute: 1.6 10*3/uL (ref 0.7–3.1)
Lymphs: 22 %
MCH: 28.2 pg (ref 26.6–33.0)
MCHC: 33.4 g/dL (ref 31.5–35.7)
MCV: 85 fL (ref 79–97)
Monocytes Absolute: 0.5 10*3/uL (ref 0.1–0.9)
Monocytes: 7 %
Neutrophils Absolute: 4.8 10*3/uL (ref 1.4–7.0)
Neutrophils: 68 %
Platelets: 332 10*3/uL (ref 150–450)
RBC: 4.71 x10E6/uL (ref 3.77–5.28)
RDW: 12.5 % (ref 11.7–15.4)
WBC: 7 10*3/uL (ref 3.4–10.8)

## 2023-01-15 LAB — LIPID PANEL
Chol/HDL Ratio: 2.6 ratio (ref 0.0–4.4)
Cholesterol, Total: 120 mg/dL (ref 100–199)
HDL: 47 mg/dL (ref >39)
LDL Chol Calc (NIH): 52 mg/dL (ref 0–99)
Triglycerides: 115 mg/dL (ref 0–149)
VLDL Cholesterol Cal: 21 mg/dL (ref 5–40)

## 2023-01-15 LAB — TSH: TSH: 8.44 u[IU]/mL — ABNORMAL HIGH (ref 0.450–4.500)

## 2023-01-18 ENCOUNTER — Other Ambulatory Visit: Payer: Self-pay

## 2023-01-18 ENCOUNTER — Other Ambulatory Visit: Payer: Self-pay | Admitting: Family Medicine

## 2023-01-18 ENCOUNTER — Encounter: Payer: Self-pay | Admitting: Family Medicine

## 2023-01-18 DIAGNOSIS — E039 Hypothyroidism, unspecified: Secondary | ICD-10-CM

## 2023-01-18 DIAGNOSIS — Z23 Encounter for immunization: Secondary | ICD-10-CM | POA: Insufficient documentation

## 2023-01-18 LAB — HEMOGLOBIN A1C
Est. average glucose Bld gHb Est-mCnc: 146 mg/dL
Hgb A1c MFr Bld: 6.7 % — ABNORMAL HIGH (ref 4.8–5.6)

## 2023-01-18 LAB — SPECIMEN STATUS REPORT

## 2023-01-18 MED ORDER — LEVOTHYROXINE SODIUM 125 MCG PO TABS: 125.0000 ug | ORAL_TABLET | Freq: Every day | ORAL | 0 refills | Status: DC

## 2023-01-18 NOTE — Assessment & Plan Note (Signed)
Continue ozempic.  Recommend continue to work on eating healthy diet and exercise.

## 2023-01-27 ENCOUNTER — Other Ambulatory Visit: Payer: Self-pay

## 2023-01-27 MED ORDER — AMOXICILLIN 500 MG PO CAPS: ORAL_CAPSULE | ORAL | 1 refills | Status: DC

## 2023-03-02 ENCOUNTER — Other Ambulatory Visit: Payer: BC Managed Care – PPO

## 2023-03-02 DIAGNOSIS — E039 Hypothyroidism, unspecified: Secondary | ICD-10-CM

## 2023-03-03 LAB — TSH: TSH: 2.53 u[IU]/mL (ref 0.450–4.500)

## 2023-03-03 LAB — T4, FREE: Free T4: 1.51 ng/dL (ref 0.82–1.77)

## 2023-03-04 ENCOUNTER — Other Ambulatory Visit: Payer: Self-pay | Admitting: Family Medicine

## 2023-03-04 DIAGNOSIS — E782 Mixed hyperlipidemia: Secondary | ICD-10-CM

## 2023-03-17 ENCOUNTER — Other Ambulatory Visit: Payer: Self-pay | Admitting: Family Medicine

## 2023-03-17 DIAGNOSIS — E1165 Type 2 diabetes mellitus with hyperglycemia: Secondary | ICD-10-CM

## 2023-03-17 DIAGNOSIS — E039 Hypothyroidism, unspecified: Secondary | ICD-10-CM

## 2023-03-19 ENCOUNTER — Other Ambulatory Visit: Payer: Self-pay | Admitting: Family Medicine

## 2023-03-19 DIAGNOSIS — E782 Mixed hyperlipidemia: Secondary | ICD-10-CM

## 2023-04-01 ENCOUNTER — Ambulatory Visit
Admission: RE | Admit: 2023-04-01 | Discharge: 2023-04-01 | Disposition: A | Discharge status: Home / Self Care | Payer: BC Managed Care – PPO | Source: Ambulatory Visit | Attending: Family Medicine | Admitting: Family Medicine

## 2023-04-19 ENCOUNTER — Ambulatory Visit: Payer: BC Managed Care – PPO

## 2023-04-19 VITALS — BP 104/62 | HR 95 | Temp 97.6°F | Resp 16 | Ht 65.0 in | Wt 247.4 lb

## 2023-04-19 DIAGNOSIS — K6289 Other specified diseases of anus and rectum: Secondary | ICD-10-CM | POA: Diagnosis not present

## 2023-04-19 DIAGNOSIS — R300 Dysuria: Secondary | ICD-10-CM | POA: Diagnosis not present

## 2023-04-19 LAB — POCT URINALYSIS DIP (CLINITEK)
Bilirubin, UA: NEGATIVE
Glucose, UA: NEGATIVE mg/dL
Ketones, POC UA: NEGATIVE mg/dL
Leukocytes, UA: NEGATIVE
Nitrite, UA: NEGATIVE
POC PROTEIN,UA: NEGATIVE
Spec Grav, UA: >=1.03 — AB (ref 1.010–1.025)
Urobilinogen, UA: 0.2 U/dL
pH, UA: 5.5 (ref 5.0–8.0)

## 2023-04-19 MED ORDER — CEPHALEXIN 500 MG PO CAPS: 500.0000 mg | ORAL_CAPSULE | Freq: Three times a day (TID) | ORAL | 0 refills | Status: AC

## 2023-04-19 NOTE — Assessment & Plan Note (Signed)
Reports rectal abscess last year which needed surgical intervention. I did not find any abnormal findings on exam, no induration in the rectal region or erythema but patient states the symptoms are deeper inside her rectum.  PLAN: I am going to treat her with antibiotic Keflex for possible UTI anyway. -Ordered blood work to rule out any occult infection. -Advised to apply Preparation H for symptomatic relief -If her symptoms get worse, she should be evaluated in the emergency room for imaging to rule out an abscess in her rectum.

## 2023-04-19 NOTE — Progress Notes (Signed)
Acute Office Visit  Subjective:    Patient ID: Kathleen Russo, female    DOB: 12-30-1960, 62 y.o.   MRN: [id number]  Chief Complaint  Patient presents with   Urinary Tract Infection    HPI: Patient is in today for a problem visit.  She presents with symptoms since Friday evening of pressure in her rectal area. She thought its "another perineal abscess", but Sunday, she started noticing frequency, pain with urination.  Had a perineal abscess last year which needed surgery.,  Bowels moving okay, with miralax since Friday.   Past Medical History:  Diagnosis Date   Anemia    Arthritis    Cancer (HCC)    UTERINE   Diabetes mellitus    DM2, not requiring meds as of 04/2011   Dizziness    GERD (gastroesophageal reflux disease)    Hemorrhoids    Hyperlipidemia    Hypertension    Hypothyroidism    Lichen sclerosus    PONV (postoperative nausea and vomiting)    Primary osteoarthritis    Sleep apnea    USES CPAP   Swelling of both ankles    Type 2 diabetes mellitus (HCC)     Past Surgical History:  Procedure Laterality Date   ABDOMINAL HYSTERECTOMY     BARIATRIC SURGERY     BREAST LUMPECTOMY Left    BREAST SURGERY  1979   ELBOW SURGERY  1990'S   GASTRIC BYPASS  2007   INCISION AND DRAINAGE PERIRECTAL ABSCESS N/A 03/09/2022   Procedure: IRRIGATION AND DEBRIDEMENT PERIRECTAL ABSCESS;  Surgeon: Emelia Loron, MD;  Location: [facility];  Service: General;  Laterality: N/A;   JOINT REPLACEMENT  2002   RT. KNEE   KNEE ARTHROSCOPY  1993   LT. KNEE   KNEE ARTHROSCOPY W/ LATERAL RELEASE  1987   RT. KNEE   left hip replacement  05/2017   SHOULDER ARTHROSCOPY Right 05/22/2022   SHOULDER SURGERY Right 01/20/2022   rotator cuff repair   TONSILLECTOMY     TOTAL KNEE ARTHROPLASTY  07/06/2011   Procedure: TOTAL KNEE ARTHROPLASTY;  Surgeon: Raymon Mutton, MD;  Location: [facility];  Service: Orthopedics;  Laterality: Left;    Family History  Problem Relation Age of Onset    Hypertension Mother    Thyroid disease Mother    Diabetes Mother    Other Father        Wegoners disease   Thyroid disease Sister    Diabetes Sister    Breast cancer Daughter    Breast cancer Maternal Aunt    Breast cancer Paternal Aunt    Graves' disease Brother     Social History   Socioeconomic History   Marital status: Married    Spouse name: Not on file   Number of children: Not on file   Years of education: Not on file   Highest education level: Not on file  Occupational History   Occupation: retired    Comment: [employer]  Tobacco Use   Smoking status: Never   Smokeless tobacco: Never  Vaping Use   Vaping status: Never Used  Substance and Sexual Activity   Alcohol use: No   Drug use: No   Sexual activity: Yes    Partners: Male    Birth control/protection: Surgical    Comment: hysterectomy  Other Topics Concern   Not on file  Social History Narrative   Not on file   Social Determinants of Health   Financial Resource Strain: Low Risk  (09/04/2022)  Overall Financial Resource Strain (CARDIA)    Difficulty of Paying Living Expenses: Not hard at all  Food Insecurity: No Food Insecurity (03/10/2022)   Hunger Vital Sign    Worried About Running Out of Food in the Last Year: Never true    Ran Out of Food in the Last Year: Never true  Transportation Needs: No Transportation Needs (03/10/2022)   PRAPARE - Administrator, Civil Service (Medical): No    Lack of Transportation (Non-Medical): No  Physical Activity: Inactive (09/04/2022)   Exercise Vital Sign    Days of Exercise per Week: 0 days    Minutes of Exercise per Session: 0 min  Stress: No Stress Concern Present (09/04/2022)   [employer] of Occupational Health - Occupational Stress Questionnaire    Feeling of Stress : Not at all  Social Connections: Moderately Integrated (09/04/2022)   Social Connection and Isolation Panel [NHANES]    Frequency of Communication with Friends and Family: Three  times a week    Frequency of Social Gatherings with Friends and Family: Three times a week    Attends Religious Services: More than 4 times per year    Active Member of Clubs or Organizations: No    Attends Banker Meetings: Never    Marital Status: Married  Catering manager Violence: Not At Risk (03/10/2022)   Humiliation, Afraid, Rape, and Kick questionnaire    Fear of Current or Ex-Partner: No    Emotionally Abused: No    Physically Abused: No    Sexually Abused: No    Outpatient Medications Prior to Visit  Medication Sig Dispense Refill   acetaminophen (TYLENOL) 500 MG tablet Take 2 tablets (1,000 mg total) by mouth every 6 (six) hours. 30 tablet 0   amoxicillin (AMOXIL) 500 MG capsule Take 4 capsules 1 hour prior to dental procedure. 4 capsule 1   blood glucose meter kit and supplies KIT Dispense based on patient and insurance preference. Check daily in am. E11.69. 1 each 0   clobetasol ointment (TEMOVATE) 0.05 % Apply 1 application topically 2 (two) times daily. 60 g 1   cyanocobalamin (VITAMIN B12) 1000 MCG/ML injection Inject 1 ml weekly for 4 weeks then 1 ml every 4 weeks. 10 mL 1   cyclobenzaprine (FLEXERIL) 10 MG tablet Take 10 mg by mouth every 6 (six) hours as needed for muscle spasms.     fluticasone (FLONASE) 50 MCG/ACT nasal spray Place 2 sprays into both nostrils daily. 16 g 6   levothyroxine (SYNTHROID) 125 MCG tablet TAKE 1 TABLET(125 MCG) BY MOUTH DAILY BEFORE BREAKFAST 90 tablet 0   lisinopril-hydrochlorothiazide (ZESTORETIC) 10-12.5 MG tablet TAKE 1 TABLET BY MOUTH EVERY DAY 90 tablet 1   loratadine (CLARITIN) 10 MG tablet Take 1 tablet (10 mg total) by mouth daily as needed for allergies. 90 tablet 1   meloxicam (MOBIC) 15 MG tablet Take 1 tablet (15 mg total) by mouth daily. 90 tablet 0   montelukast (SINGULAIR) 10 MG tablet TAKE 1 TABLET(10 MG) BY MOUTH AT BEDTIME 90 tablet 3   ondansetron (ZOFRAN) 4 MG tablet Take 4 mg by mouth every 8 (eight) hours  as needed for nausea or vomiting.     ONETOUCH ULTRA test strip USE TO CHECK BLOOD SUGAR ONCE DAILY IN THE MORNING 50 each 1   polyethylene glycol powder (MIRALAX) 17 GM/SCOOP powder Take 8.5-34 g by mouth daily. To correct constipation.  Adjust dose over 1-2 months.  Goal = ~1 bowel movement / day  rosuvastatin (CRESTOR) 10 MG tablet TAKE 1 TABLET(10 MG) BY MOUTH DAILY 90 tablet 0   Semaglutide, 2 MG/DOSE, (OZEMPIC, 2 MG/DOSE,) 8 MG/3ML SOPN INJECT 2 MG SUBCUTANEOUS ONCE WEEKLY 9 mL 0   Vitamin D, Ergocalciferol, (DRISDOL) 1.25 MG (50000 UNIT) CAPS capsule TAKE 1 CAPSULE BY MOUTH EVERY 7 DAYS 12 capsule 1   nystatin (MYCOSTATIN/NYSTOP) powder Apply 1 application. topically 3 (three) times daily. Apply to affected area for up to 7 days 30 g 2   No facility-administered medications prior to visit.    Allergies  Allergen Reactions   Etodolac     swelling   Quinolones     Medications don't absorb   Tolmetin Other (See Comments)    THIS MEDICATION WILL NOT ABSORB D/T GBP Medications don't absorb     Review of Systems  Constitutional:  Negative for appetite change, fatigue and fever.  HENT:  Negative for congestion, ear pain, sinus pressure and sore throat.   Respiratory:  Negative for cough, chest tightness, shortness of breath and wheezing.   Cardiovascular:  Negative for chest pain and palpitations.  Gastrointestinal:  Positive for rectal pain. Negative for abdominal pain, blood in stool, constipation, diarrhea, nausea and vomiting.  Genitourinary:  Positive for dysuria and frequency. Negative for hematuria.  Musculoskeletal:  Negative for arthralgias, back pain, joint swelling and myalgias.  Skin:  Negative for rash.  Neurological:  Negative for dizziness, weakness and headaches.  Psychiatric/Behavioral:  Negative for dysphoric mood. The patient is not nervous/anxious.        Objective:        04/19/2023    9:07 AM 01/15/2023    7:26 AM 09/04/2022    7:55 AM  Vitals with  BMI  Height 5\' 5"  5\' 5"  5\' 5"   Weight 247 lbs 6 oz 250 lbs 248 lbs  BMI 41.17 41.6 41.27  Systolic 104 110 956  Diastolic 62 72 68  Pulse 95 72 69    Orthostatic VS for the past 72 hrs (Last 3 readings):  Patient Position BP Location Cuff Size  04/19/23 0907 Sitting Left Arm Large     Physical Exam Constitutional:      General: She is in acute distress (in pain).     Appearance: She is obese.  HENT:     Head: Normocephalic and atraumatic.  Cardiovascular:     Rate and Rhythm: Normal rate and regular rhythm.  Pulmonary:     Effort: Pulmonary effort is normal.     Breath sounds: Normal breath sounds.  Abdominal:     General: There is no distension.     Tenderness: There is no abdominal tenderness. There is no right CVA tenderness or left CVA tenderness.  Genitourinary:    Comments: Rectum normal in appearance. I did not notice any abnormal findings, there was no induration or erythema. No hemorrhoids or fissures noted. But patient states the area of pain is a little deeper into the rectum  Musculoskeletal:     Cervical back: Normal range of motion.  Skin:    General: Skin is warm.  Neurological:     General: No focal deficit present.     Mental Status: She is alert.  Psychiatric:        Mood and Affect: Mood normal.     Health Maintenance Due  Topic Date Due   Cervical Cancer Screening (HPV/Pap Cotest)  03/29/2021   INFLUENZA VACCINE  01/21/2023   COVID-19 Vaccine (4 - 2023-24 season) 02/21/2023   OPHTHALMOLOGY EXAM  02/27/2023   Zoster Vaccines- Shingrix (2 of 2) 03/12/2023    There are no preventive care reminders to display for this patient.   Lab Results  Component Value Date   TSH 2.530 03/02/2023   Lab Results  Component Value Date   WBC 7.0 01/15/2023   HGB 13.3 01/15/2023   HCT 39.8 01/15/2023   MCV 85 01/15/2023   PLT 332 01/15/2023   Lab Results  Component Value Date   NA 142 01/15/2023   K 4.5 01/15/2023   CO2 25 01/15/2023   GLUCOSE 110  (H) 01/15/2023   BUN 14 01/15/2023   CREATININE 0.65 01/15/2023   BILITOT 0.4 01/15/2023   ALKPHOS 100 01/15/2023   AST 17 01/15/2023   ALT 10 01/15/2023   PROT 6.5 01/15/2023   ALBUMIN 4.3 01/15/2023   CALCIUM 9.4 01/15/2023   ANIONGAP 11 03/10/2022   EGFR 99 01/15/2023   Lab Results  Component Value Date   CHOL 120 01/15/2023   Lab Results  Component Value Date   HDL 47 01/15/2023   Lab Results  Component Value Date   LDLCALC 52 01/15/2023   Lab Results  Component Value Date   TRIG 115 01/15/2023   Lab Results  Component Value Date   CHOLHDL 2.6 01/15/2023   Lab Results  Component Value Date   HGBA1C 6.7 (H) 01/15/2023       Assessment & Plan:  Dysuria Assessment & Plan: Urine analysis done in office today showed increased specific gravity, positive ketones, positive blood, positive protein and negative nitrite and leukocyte Estrace.  No flank tenderness or abdominal tenderness on exam.  Plan: In view of the positive blood, positive protein and increase specific gravity, will empirically treat with antibiotic, also in view of her rectal pain and history of rectal abscess. -Sent a prescription for Keflex 500 3 times daily for 7 days to her pharmacy -Will also order blood work to rule out infection/ sepsis as she also complains of chills. -Advised to stay well-hydrated -Report back if any worsening symptoms or call 911 for any severe symptoms   Orders: -     C-reactive protein -     CBC with Differential/Platelet -     Comprehensive metabolic panel -     POCT UA - Glucose/Protein -     POCT URINALYSIS DIP (CLINITEK) -     Urine Culture  Rectal pain Assessment & Plan: Reports rectal abscess last year which needed surgical intervention. I did not find any abnormal findings on exam, no induration in the rectal region or erythema but patient states the symptoms are deeper inside her rectum.  PLAN: I am going to treat her with antibiotic Keflex for possible  UTI anyway. -Ordered blood work to rule out any occult infection. -Advised to apply Preparation H for symptomatic relief -If her symptoms get worse, she should be evaluated in the emergency room for imaging to rule out an abscess in her rectum.  Orders: -     C-reactive protein -     CBC with Differential/Platelet -     Comprehensive metabolic panel  Other orders -     Cephalexin; Take 1 capsule (500 mg total) by mouth 3 (three) times daily for 7 days.  Dispense: 21 capsule; Refill: 0     Meds ordered this encounter  Medications   cephALEXin (KEFLEX) 500 MG capsule    Sig: Take 1 capsule (500 mg total) by mouth 3 (three) times daily for 7 days.  Dispense:  21 capsule    Refill:  0    Orders Placed This Encounter  Procedures   Urine Culture   C-reactive protein   CBC with Differential/Platelet   Comprehensive metabolic panel   Urinalysis - Glucose/Protein   POCT URINALYSIS DIP (CLINITEK)     Follow-up: No follow-ups on file.  An After Visit Summary was printed and given to the patient.  Windell Moment, MD [facility] [phone]

## 2023-04-19 NOTE — Assessment & Plan Note (Signed)
Urine analysis done in office today showed increased specific gravity, positive ketones, positive blood, positive protein and negative nitrite and leukocyte Estrace.  No flank tenderness or abdominal tenderness on exam.  Plan: In view of the positive blood, positive protein and increase specific gravity, will empirically treat with antibiotic, also in view of her rectal pain and history of rectal abscess. -Sent a prescription for Keflex 500 3 times daily for 7 days to her pharmacy -Will also order blood work to rule out infection/ sepsis as she also complains of chills. -Advised to stay well-hydrated -Report back if any worsening symptoms or call 911 for any severe symptoms

## 2023-04-19 NOTE — Patient Instructions (Signed)
Since your urine showed possible infection and in view of the rectal pain, sent antibiotic to your pharmacy Will do blood work Apply preparation H for relief Drink plenty of fluids Report back if any worsening symptoms or call 911 for any severe symptoms

## 2023-04-20 LAB — COMPREHENSIVE METABOLIC PANEL
ALT: 11 [IU]/L (ref 0–32)
AST: 12 [IU]/L (ref 0–40)
Albumin: 3.9 g/dL (ref 3.9–4.9)
Alkaline Phosphatase: 102 [IU]/L (ref 44–121)
BUN/Creatinine Ratio: 18 (ref 12–28)
BUN: 14 mg/dL (ref 8–27)
Bilirubin Total: 0.5 mg/dL (ref 0.0–1.2)
CO2: 23 mmol/L (ref 20–29)
Calcium: 9.3 mg/dL (ref 8.7–10.3)
Chloride: 97 mmol/L (ref 96–106)
Creatinine, Ser: 0.76 mg/dL (ref 0.57–1.00)
Globulin, Total: 2.6 g/dL (ref 1.5–4.5)
Glucose: 160 mg/dL — ABNORMAL HIGH (ref 70–99)
Potassium: 4.3 mmol/L (ref 3.5–5.2)
Sodium: 138 mmol/L (ref 134–144)
Total Protein: 6.5 g/dL (ref 6.0–8.5)
eGFR: 88 mL/min/{1.73_m2} (ref >59)

## 2023-04-20 LAB — C-REACTIVE PROTEIN: CRP: 126 mg/L — ABNORMAL HIGH (ref 0–10)

## 2023-04-20 LAB — CBC WITH DIFFERENTIAL/PLATELET
Basophils Absolute: 0 10*3/uL (ref 0.0–0.2)
Basos: 0 %
EOS (ABSOLUTE): 0 10*3/uL (ref 0.0–0.4)
Eos: 0 %
Hematocrit: 39.2 % (ref 34.0–46.6)
Hemoglobin: 13.1 g/dL (ref 11.1–15.9)
Immature Grans (Abs): 0 10*3/uL (ref 0.0–0.1)
Immature Granulocytes: 0 %
Lymphocytes Absolute: 0.8 10*3/uL (ref 0.7–3.1)
Lymphs: 6 %
MCH: 28.5 pg (ref 26.6–33.0)
MCHC: 33.4 g/dL (ref 31.5–35.7)
MCV: 85 fL (ref 79–97)
Monocytes Absolute: 0.8 10*3/uL (ref 0.1–0.9)
Monocytes: 6 %
Neutrophils Absolute: 12.9 10*3/uL — ABNORMAL HIGH (ref 1.4–7.0)
Neutrophils: 88 %
Platelets: 375 10*3/uL (ref 150–450)
RBC: 4.6 x10E6/uL (ref 3.77–5.28)
RDW: 12.2 % (ref 11.7–15.4)
WBC: 14.7 10*3/uL — ABNORMAL HIGH (ref 3.4–10.8)

## 2023-04-21 ENCOUNTER — Observation Stay (HOSPITAL_COMMUNITY)
Admission: EM | Admit: 2023-04-21 | Discharge: 2023-04-22 | Disposition: A | Discharge status: Home / Self Care | Payer: BC Managed Care – PPO | Attending: Emergency Medicine | Admitting: Emergency Medicine

## 2023-04-21 ENCOUNTER — Emergency Department (HOSPITAL_COMMUNITY): Payer: BC Managed Care – PPO

## 2023-04-21 ENCOUNTER — Encounter (HOSPITAL_COMMUNITY): Payer: Self-pay | Admitting: Emergency Medicine

## 2023-04-21 ENCOUNTER — Other Ambulatory Visit: Payer: Self-pay

## 2023-04-21 DIAGNOSIS — Z8349 Family history of other endocrine, nutritional and metabolic diseases: Secondary | ICD-10-CM | POA: Diagnosis not present

## 2023-04-21 DIAGNOSIS — Z791 Long term (current) use of non-steroidal anti-inflammatories (NSAID): Secondary | ICD-10-CM

## 2023-04-21 DIAGNOSIS — L03317 Cellulitis of buttock: Secondary | ICD-10-CM | POA: Diagnosis present

## 2023-04-21 DIAGNOSIS — Z9884 Bariatric surgery status: Secondary | ICD-10-CM

## 2023-04-21 DIAGNOSIS — Z8249 Family history of ischemic heart disease and other diseases of the circulatory system: Secondary | ICD-10-CM

## 2023-04-21 DIAGNOSIS — G4733 Obstructive sleep apnea (adult) (pediatric): Secondary | ICD-10-CM | POA: Diagnosis present

## 2023-04-21 DIAGNOSIS — Z96642 Presence of left artificial hip joint: Secondary | ICD-10-CM | POA: Diagnosis present

## 2023-04-21 DIAGNOSIS — N390 Urinary tract infection, site not specified: Secondary | ICD-10-CM | POA: Diagnosis present

## 2023-04-21 DIAGNOSIS — Z833 Family history of diabetes mellitus: Secondary | ICD-10-CM | POA: Diagnosis not present

## 2023-04-21 DIAGNOSIS — K611 Rectal abscess: Principal | ICD-10-CM | POA: Diagnosis present

## 2023-04-21 DIAGNOSIS — Z79899 Other long term (current) drug therapy: Secondary | ICD-10-CM

## 2023-04-21 DIAGNOSIS — Z8542 Personal history of malignant neoplasm of other parts of uterus: Secondary | ICD-10-CM | POA: Insufficient documentation

## 2023-04-21 DIAGNOSIS — Z888 Allergy status to other drugs, medicaments and biological substances status: Secondary | ICD-10-CM | POA: Diagnosis not present

## 2023-04-21 DIAGNOSIS — E782 Mixed hyperlipidemia: Secondary | ICD-10-CM | POA: Diagnosis present

## 2023-04-21 DIAGNOSIS — E66813 Obesity, class 3: Secondary | ICD-10-CM | POA: Diagnosis present

## 2023-04-21 DIAGNOSIS — Z96653 Presence of artificial knee joint, bilateral: Secondary | ICD-10-CM | POA: Insufficient documentation

## 2023-04-21 DIAGNOSIS — E119 Type 2 diabetes mellitus without complications: Secondary | ICD-10-CM | POA: Insufficient documentation

## 2023-04-21 DIAGNOSIS — E039 Hypothyroidism, unspecified: Secondary | ICD-10-CM | POA: Insufficient documentation

## 2023-04-21 DIAGNOSIS — Z803 Family history of malignant neoplasm of breast: Secondary | ICD-10-CM

## 2023-04-21 DIAGNOSIS — I10 Essential (primary) hypertension: Secondary | ICD-10-CM | POA: Diagnosis not present

## 2023-04-21 DIAGNOSIS — Z90710 Acquired absence of both cervix and uterus: Secondary | ICD-10-CM | POA: Diagnosis not present

## 2023-04-21 DIAGNOSIS — Z6841 Body Mass Index (BMI) 40.0 and over, adult: Secondary | ICD-10-CM

## 2023-04-21 DIAGNOSIS — R197 Diarrhea, unspecified: Secondary | ICD-10-CM | POA: Diagnosis present

## 2023-04-21 DIAGNOSIS — Z7985 Long-term (current) use of injectable non-insulin antidiabetic drugs: Secondary | ICD-10-CM

## 2023-04-21 DIAGNOSIS — Z79890 Hormone replacement therapy (postmenopausal): Secondary | ICD-10-CM

## 2023-04-21 DIAGNOSIS — E785 Hyperlipidemia, unspecified: Secondary | ICD-10-CM | POA: Diagnosis present

## 2023-04-21 DIAGNOSIS — K6289 Other specified diseases of anus and rectum: Secondary | ICD-10-CM | POA: Diagnosis present

## 2023-04-21 LAB — COMPREHENSIVE METABOLIC PANEL
ALT: 32 U/L (ref 0–44)
AST: 38 U/L (ref 15–41)
Albumin: 3.6 g/dL (ref 3.5–5.0)
Alkaline Phosphatase: 97 U/L (ref 38–126)
Anion gap: 14 (ref 5–15)
BUN: 18 mg/dL (ref 8–23)
CO2: 25 mmol/L (ref 22–32)
Calcium: 8.6 mg/dL — ABNORMAL LOW (ref 8.9–10.3)
Chloride: 93 mmol/L — ABNORMAL LOW (ref 98–111)
Creatinine, Ser: 0.97 mg/dL (ref 0.44–1.00)
GFR, Estimated: >60 mL/min (ref >60)
Glucose, Bld: 153 mg/dL — ABNORMAL HIGH (ref 70–99)
Potassium: 3.5 mmol/L (ref 3.5–5.1)
Sodium: 132 mmol/L — ABNORMAL LOW (ref 135–145)
Total Bilirubin: 0.7 mg/dL (ref 0.3–1.2)
Total Protein: 8.2 g/dL — ABNORMAL HIGH (ref 6.5–8.1)

## 2023-04-21 LAB — CBC WITH DIFFERENTIAL/PLATELET
Abs Immature Granulocytes: 0.07 10*3/uL (ref 0.00–0.07)
Basophils Absolute: 0 10*3/uL (ref 0.0–0.1)
Basophils Relative: 0 %
Eosinophils Absolute: 0 10*3/uL (ref 0.0–0.5)
Eosinophils Relative: 0 %
HCT: 40.1 % (ref 36.0–46.0)
Hemoglobin: 13.2 g/dL (ref 12.0–15.0)
Immature Granulocytes: 1 %
Lymphocytes Relative: 7 %
Lymphs Abs: 1 10*3/uL (ref 0.7–4.0)
MCH: 28 pg (ref 26.0–34.0)
MCHC: 32.9 g/dL (ref 30.0–36.0)
MCV: 85.1 fL (ref 80.0–100.0)
Monocytes Absolute: 0.8 10*3/uL (ref 0.1–1.0)
Monocytes Relative: 6 %
Neutro Abs: 11.7 10*3/uL — ABNORMAL HIGH (ref 1.7–7.7)
Neutrophils Relative %: 86 %
Platelets: 406 10*3/uL — ABNORMAL HIGH (ref 150–400)
RBC: 4.71 MIL/uL (ref 3.87–5.11)
RDW: 12.1 % (ref 11.5–15.5)
WBC: 13.6 10*3/uL — ABNORMAL HIGH (ref 4.0–10.5)
nRBC: 0 % (ref 0.0–0.2)

## 2023-04-21 LAB — I-STAT CHEM 8, ED
BUN: 18 mg/dL (ref 8–23)
Calcium, Ion: 1.06 mmol/L — ABNORMAL LOW (ref 1.15–1.40)
Chloride: 95 mmol/L — ABNORMAL LOW (ref 98–111)
Creatinine, Ser: 0.9 mg/dL (ref 0.44–1.00)
Glucose, Bld: 152 mg/dL — ABNORMAL HIGH (ref 70–99)
HCT: 42 % (ref 36.0–46.0)
Hemoglobin: 14.3 g/dL (ref 12.0–15.0)
Potassium: 3.6 mmol/L (ref 3.5–5.1)
Sodium: 134 mmol/L — ABNORMAL LOW (ref 135–145)
TCO2: 25 mmol/L (ref 22–32)

## 2023-04-21 LAB — URINE CULTURE

## 2023-04-21 LAB — I-STAT CG4 LACTIC ACID, ED: Lactic Acid, Venous: 1.5 mmol/L (ref 0.5–1.9)

## 2023-04-21 MED ORDER — MORPHINE SULFATE (PF) 4 MG/ML IV SOLN
4.0000 mg | Freq: Once | INTRAVENOUS | Status: AC
  Administered 2023-04-21: 4 mg via INTRAVENOUS
  Filled 2023-04-21: qty 1

## 2023-04-21 MED ORDER — HYDROMORPHONE HCL 1 MG/ML IJ SOLN
1.0000 mg | INTRAMUSCULAR | Status: DC | PRN
  Administered 2023-04-22: 1 mg via INTRAVENOUS
  Filled 2023-04-21: qty 1

## 2023-04-21 MED ORDER — PIPERACILLIN-TAZOBACTAM 3.375 G IVPB 30 MIN
3.3750 g | Freq: Once | INTRAVENOUS | Status: AC
  Administered 2023-04-21: 3.375 g via INTRAVENOUS
  Filled 2023-04-21: qty 50

## 2023-04-21 MED ORDER — ONDANSETRON 4 MG PO TBDP
4.0000 mg | ORAL_TABLET | Freq: Four times a day (QID) | ORAL | Status: DC | PRN
Start: 2023-04-21 — End: 2023-04-22

## 2023-04-21 MED ORDER — ROSUVASTATIN CALCIUM 20 MG PO TABS
20.0000 mg | ORAL_TABLET | Freq: Every day | ORAL | Status: DC
  Filled 2023-04-21: qty 1

## 2023-04-21 MED ORDER — IOHEXOL 300 MG/ML  SOLN
100.0000 mL | Freq: Once | INTRAMUSCULAR | Status: AC | PRN
  Administered 2023-04-21: 100 mL via INTRAVENOUS

## 2023-04-21 MED ORDER — ONDANSETRON HCL 4 MG/2ML IJ SOLN
4.0000 mg | Freq: Four times a day (QID) | INTRAMUSCULAR | Status: DC | PRN
  Administered 2023-04-22: 4 mg via INTRAVENOUS

## 2023-04-21 MED ORDER — SODIUM CHLORIDE 0.9 % IV BOLUS
1000.0000 mL | Freq: Once | INTRAVENOUS | Status: AC
Start: 2023-04-21 — End: 2023-04-21
  Administered 2023-04-21: 1000 mL via INTRAVENOUS

## 2023-04-21 MED ORDER — MORPHINE SULFATE (PF) 4 MG/ML IV SOLN: 4.0000 mg | INTRAVENOUS | Status: DC | PRN

## 2023-04-21 MED ORDER — INSULIN ASPART 100 UNIT/ML IJ SOLN: 0.0000 [IU] | Freq: Three times a day (TID) | INTRAMUSCULAR | Status: DC

## 2023-04-21 MED ORDER — METRONIDAZOLE 500 MG/100ML IV SOLN
500.0000 mg | Freq: Once | INTRAVENOUS | Status: AC
  Administered 2023-04-21: 500 mg via INTRAVENOUS
  Filled 2023-04-21: qty 100

## 2023-04-21 MED ORDER — HYDROCHLOROTHIAZIDE 12.5 MG PO TABS
12.5000 mg | ORAL_TABLET | Freq: Every day | ORAL | Status: DC
  Filled 2023-04-21: qty 1

## 2023-04-21 MED ORDER — ENOXAPARIN SODIUM 40 MG/0.4ML IJ SOSY
40.0000 mg | PREFILLED_SYRINGE | Freq: Every day | INTRAMUSCULAR | Status: DC
Start: 2023-04-21 — End: 2023-04-22

## 2023-04-21 MED ORDER — LISINOPRIL 10 MG PO TABS
10.0000 mg | ORAL_TABLET | Freq: Every day | ORAL | Status: DC
  Filled 2023-04-21: qty 1

## 2023-04-21 MED ORDER — SODIUM CHLORIDE 0.9 % IV SOLN: Freq: Once | INTRAVENOUS | Status: AC

## 2023-04-21 MED ORDER — LISINOPRIL-HYDROCHLOROTHIAZIDE 10-12.5 MG PO TABS: 1.0000 | ORAL_TABLET | Freq: Every day | ORAL | Status: DC

## 2023-04-21 MED ORDER — MELATONIN 3 MG PO TABS: 3.0000 mg | ORAL_TABLET | Freq: Every evening | ORAL | Status: DC | PRN

## 2023-04-21 NOTE — ED Notes (Signed)
Attempted to call report nurse advised she would need a minute to look over the report was in a pt room at time of call

## 2023-04-21 NOTE — ED Notes (Signed)
ED TO INPATIENT HANDOFF REPORT  Name/Age/Gender Kathleen Russo 62 y.o. female  Code Status Code Status History     Date Active Date Inactive Code Status Order ID Comments User Context   03/10/2022 0038 03/11/2022 1619 Full Code [id number]  Emelia Loron, MD Inpatient       Home/SNF/Other Home  Chief Complaint Perirectal abscess [K61.1]  Level of Care/Admitting Diagnosis ED Disposition     ED Disposition  Admit   Condition  --   Comment  Hospital Area: [facility] [[id number]]  Level of Care: Med-Surg [16]  May admit patient to [facility] or [facility] if equivalent level of care is available:: No  Covid Evaluation: Asymptomatic - no recent exposure (last 10 days) testing not required  Diagnosis: Perirectal abscess [409811]  Admitting Physician: CCS, MD [[id number]]  Attending Physician: Harriette Bouillon [[id number]]  Certification:: I certify this patient will need inpatient services for at least 2 midnights  Expected Medical Readiness: 04/26/2023          Medical History Past Medical History:  Diagnosis Date   Anemia    Arthritis    Cancer (HCC)    UTERINE   Diabetes mellitus    DM2, not requiring meds as of 04/2011   Dizziness    GERD (gastroesophageal reflux disease)    Hemorrhoids    Hyperlipidemia    Hypertension    Hypothyroidism    Lichen sclerosus    PONV (postoperative nausea and vomiting)    Primary osteoarthritis    Sleep apnea    USES CPAP   Swelling of both ankles    Type 2 diabetes mellitus (HCC)     Allergies Allergies  Allergen Reactions   Etodolac     swelling   Quinolones     Medications don't absorb   Tolmetin Other (See Comments)    THIS MEDICATION WILL NOT ABSORB D/T GBP Medications don't absorb     IV Location/Drains/Wounds Patient Lines/Drains/Airways Status     Active Line/Drains/Airways     None            Labs/Imaging Results for orders placed or performed during the hospital encounter of  04/21/23 (from the past 48 hour(s))  Comprehensive metabolic panel     Status: Abnormal   Collection Time: 04/21/23  1:33 PM  Result Value Ref Range   Sodium 132 (L) 135 - 145 mmol/L   Potassium 3.5 3.5 - 5.1 mmol/L   Chloride 93 (L) 98 - 111 mmol/L   CO2 25 22 - 32 mmol/L   Glucose, Bld 153 (H) 70 - 99 mg/dL    Comment: Glucose reference range applies only to samples taken after fasting for at least 8 hours.   BUN 18 8 - 23 mg/dL   Creatinine, Ser 9.14 0.44 - 1.00 mg/dL   Calcium 8.6 (L) 8.9 - 10.3 mg/dL   Total Protein 8.2 (H) 6.5 - 8.1 g/dL   Albumin 3.6 3.5 - 5.0 g/dL   AST 38 15 - 41 U/L   ALT 32 0 - 44 U/L   Alkaline Phosphatase 97 38 - 126 U/L   Total Bilirubin 0.7 0.3 - 1.2 mg/dL   GFR, Estimated >78 >29 mL/min    Comment: (NOTE) Calculated using the CKD-EPI Creatinine Equation (2021)    Anion gap 14 5 - 15    Comment: Performed at [facility], [address], [place], [place] [address]  CBC with Differential     Status: Abnormal  Collection Time: 04/21/23  1:33 PM  Result Value Ref Range   WBC 13.6 (H) 4.0 - 10.5 K/uL   RBC 4.71 3.87 - 5.11 MIL/uL   Hemoglobin 13.2 12.0 - 15.0 g/dL   HCT 24.4 01.0 - 27.2 %   MCV 85.1 80.0 - 100.0 fL   MCH 28.0 26.0 - 34.0 pg   MCHC 32.9 30.0 - 36.0 g/dL   RDW 53.6 64.4 - 03.4 %   Platelets 406 (H) 150 - 400 K/uL   nRBC 0.0 0.0 - 0.2 %   Neutrophils Relative % 86 %   Neutro Abs 11.7 (H) 1.7 - 7.7 K/uL   Lymphocytes Relative 7 %   Lymphs Abs 1.0 0.7 - 4.0 K/uL   Monocytes Relative 6 %   Monocytes Absolute 0.8 0.1 - 1.0 K/uL   Eosinophils Relative 0 %   Eosinophils Absolute 0.0 0.0 - 0.5 K/uL   Basophils Relative 0 %   Basophils Absolute 0.0 0.0 - 0.1 K/uL   Immature Granulocytes 1 %   Abs Immature Granulocytes 0.07 0.00 - 0.07 K/uL    Comment: Performed at [facility], [address], [place], [place] [address]  I-stat chem 8, ED (not at [facility], [facility] or [facility])     Status: Abnormal    Collection Time: 04/21/23  1:44 PM  Result Value Ref Range   Sodium 134 (L) 135 - 145 mmol/L   Potassium 3.6 3.5 - 5.1 mmol/L   Chloride 95 (L) 98 - 111 mmol/L   BUN 18 8 - 23 mg/dL   Creatinine, Ser 5.63 0.44 - 1.00 mg/dL   Glucose, Bld 875 (H) 70 - 99 mg/dL    Comment: Glucose reference range applies only to samples taken after fasting for at least 8 hours.   Calcium, Ion 1.06 (L) 1.15 - 1.40 mmol/L   TCO2 25 22 - 32 mmol/L   Hemoglobin 14.3 12.0 - 15.0 g/dL   HCT 64.3 32.9 - 51.8 %  I-Stat CG4 Lactic Acid     Status: None   Collection Time: 04/21/23  4:58 PM  Result Value Ref Range   Lactic Acid, Venous 1.5 0.5 - 1.9 mmol/L   CT ABDOMEN PELVIS W CONTRAST  Result Date: 04/21/2023 CLINICAL DATA:  Groin lymphadenopathy. EXAM: CT ABDOMEN AND PELVIS WITH CONTRAST TECHNIQUE: Multidetector CT imaging of the abdomen and pelvis was performed using the standard protocol following bolus administration of intravenous contrast. RADIATION DOSE REDUCTION: This exam was performed according to the departmental dose-optimization program which includes automated exposure control, adjustment of the mA and/or kV according to patient size and/or use of iterative reconstruction technique. CONTRAST:  OMNIPAQUE IOHEXOL 300 MG/ML  SOLN COMPARISON:  CT abdomen pelvis 03/09/2022.  Hip CT scan 10/02/2022. FINDINGS: Lower chest: There is some linear opacity lung bases likely scar or atelectasis. No pleural effusion. Coronary artery calcifications are seen. Please correlate for other coronary risk factors. Hepatobiliary: Known gallstone. Gallbladder is nondilated. Patent portal vein. No space-occupying liver lesion. Pancreas: Unremarkable. No pancreatic ductal dilatation or surrounding inflammatory changes. Spleen: Normal in size without focal abnormality. Adrenals/Urinary Tract: Adrenal glands are preserved. Bilateral parapelvic renal cysts. There is also a Bosniak 2 parenchymal cyst anteriorly with a thin  septation. Unchanged from previous. No additional follow-up. No separate enhancing mass or collecting system dilatation. The ureters have normal course and caliber extending down to the urinary bladder which is contracted. Stomach/Bowel: There is a complex fluid collection with the air seen to the right of  the rectum and anal canal region this has incompletely included in the imaging field but has a dimensions on axial series 2, image 89 approaching 5.1 x 5.3 cm. Cephalocaudal length estimated at 6.0 cm. There is adjacent severe inflammatory stranding. There is wall thickening of the adjacent bowel and some displacement. This has components above and below the pelvic floor musculature, levator with significant stranding in the presacral space. This is worrisome for an abscess. A fistula communication to the bowel is difficult to confirm on this examination and if needed dedicated fistula protocol MRI could be considered as clinically appropriate. More proximally the large bowel has a normal course and caliber with some scattered stool. Few colonic diverticula. Normal appendix. Surgical changes from gastric bypass. The stomach itself is nondilated. The small bowel is nondilated. Vascular/Lymphatic: Retroaortic left renal vein. Normal caliber aorta and IVC. Scattered vascular calcifications. No specific abnormal lymph node enlargement identified in the abdomen and pelvis. Reproductive: Status post hysterectomy. No adnexal masses. Other: No free air.  No other fluid collections. Musculoskeletal: Streak artifact related to patient's left hip arthroplasty. Curvature of the spine with moderate degenerative changes. Trace anterolisthesis of L5 on S1 with pars defects. More advanced degenerative changes of the disc space at L5-S1. IMPRESSION: Presumed abscess identified in the low pelvis to the right of the anorectal region measuring 5.1 x 5.3 x 6.0 cm. Fluid and gas collection with the adjacent inflammatory stranding and  soft tissue thickening. There is mass effect along the adjacent bowel. This has a component extending across the pelvic floor musculature, levator. Please correlate for any clinical history of a fistula communication to the bowel. Otherwise no bowel obstruction. Separate colonic diverticula. Surgical changes of prior gastric bypass. Gallstone. Electronically Signed   By: Karen Kays M.D.   On: 04/21/2023 18:58    Pending Labs Unresulted Labs (From admission, onward)     Start     Ordered   04/21/23 1620  Blood culture (routine x 2)  BLOOD CULTURE X 2,   R (with STAT occurrences)      04/21/23 1619   Signed and Held  HIV Antibody (routine testing w rflx)  (HIV Antibody (Routine testing w reflex) panel)  Once,   R        Signed and Held            Vitals/Pain Today's Vitals   04/21/23 1204 04/21/23 1205 04/21/23 1640 04/21/23 1945  BP: (!) 141/83  117/65 122/61  Pulse: 87  92 72  Resp: 16  18 18   Temp: 99.6 F (37.6 C)  100 F (37.8 C) 99 F (37.2 C)  TempSrc: Oral  Oral   SpO2: 97%  97% 98%  Weight:  240 lb (108.9 kg)    Height:  5\' 5"  (1.651 m)    PainSc:  8       Isolation Precautions No active isolations  Medications Medications  0.9 %  sodium chloride infusion (has no administration in time range)  piperacillin-tazobactam (ZOSYN) IVPB 3.375 g (0 g Intravenous Stopped 04/21/23 1901)  morphine (PF) 4 MG/ML injection 4 mg (4 mg Intravenous Given 04/21/23 1744)  sodium chloride 0.9 % bolus 1,000 mL (1,000 mLs Intravenous New Bag/Given 04/21/23 1744)  metroNIDAZOLE (FLAGYL) IVPB 500 mg (500 mg Intravenous New Bag/Given 04/21/23 1750)  iohexol (OMNIPAQUE) 300 MG/ML solution 100 mL (100 mLs Intravenous Contrast Given 04/21/23 1753)    Mobility walks

## 2023-04-21 NOTE — H&P (Signed)
Kathleen Russo is an 62 y.o. female.   Chief Complaint: Rectal pain HPI: Patient presents with a 2-day history of right buttock and rectal pain.  History of a perirectal abscess drained over a year ago.  Presents with similar symptoms.  He was seen by her PCP 2 days ago and treated for UTI.  Her pain worsened and she sought care in the emergency room.  CT scan showed a 5 cm peritoneal abscess.  No nausea vomiting.  No fever or chills.  Past Medical History:  Diagnosis Date   Anemia    Arthritis    Cancer (HCC)    UTERINE   Diabetes mellitus    DM2, not requiring meds as of 04/2011   Dizziness    GERD (gastroesophageal reflux disease)    Hemorrhoids    Hyperlipidemia    Hypertension    Hypothyroidism    Lichen sclerosus    PONV (postoperative nausea and vomiting)    Primary osteoarthritis    Sleep apnea    USES CPAP   Swelling of both ankles    Type 2 diabetes mellitus (HCC)     Past Surgical History:  Procedure Laterality Date   ABDOMINAL HYSTERECTOMY     BARIATRIC SURGERY     BREAST LUMPECTOMY Left    BREAST SURGERY  1979   ELBOW SURGERY  1990'S   GASTRIC BYPASS  2007   INCISION AND DRAINAGE PERIRECTAL ABSCESS N/A 03/09/2022   Procedure: IRRIGATION AND DEBRIDEMENT PERIRECTAL ABSCESS;  Surgeon: Emelia Loron, MD;  Location: [facility];  Service: General;  Laterality: N/A;   JOINT REPLACEMENT  2002   RT. KNEE   KNEE ARTHROSCOPY  1993   LT. KNEE   KNEE ARTHROSCOPY W/ LATERAL RELEASE  1987   RT. KNEE   left hip replacement  05/2017   SHOULDER ARTHROSCOPY Right 05/22/2022   SHOULDER SURGERY Right 01/20/2022   rotator cuff repair   TONSILLECTOMY     TOTAL KNEE ARTHROPLASTY  07/06/2011   Procedure: TOTAL KNEE ARTHROPLASTY;  Surgeon: Raymon Mutton, MD;  Location: [facility];  Service: Orthopedics;  Laterality: Left;    Family History  Problem Relation Age of Onset   Hypertension Mother    Thyroid disease Mother    Diabetes Mother    Other Father        Wegoners  disease   Thyroid disease Sister    Diabetes Sister    Breast cancer Daughter    Breast cancer Maternal Aunt    Breast cancer Paternal Hollie Salk' disease Brother    Social History:  reports that she has never smoked. She has never used smokeless tobacco. She reports that she does not drink alcohol and does not use drugs.  Allergies:  Allergies  Allergen Reactions   Etodolac     swelling   Quinolones     Medications don't absorb   Tolmetin Other (See Comments)    THIS MEDICATION WILL NOT ABSORB D/T GBP Medications don't absorb     (Not in a hospital admission)   Results for orders placed or performed during the hospital encounter of 04/21/23 (from the past 48 hour(s))  Comprehensive metabolic panel     Status: Abnormal   Collection Time: 04/21/23  1:33 PM  Result Value Ref Range   Sodium 132 (L) 135 - 145 mmol/L   Potassium 3.5 3.5 - 5.1 mmol/L   Chloride 93 (L) 98 - 111 mmol/L   CO2 25 22 - 32 mmol/L  Glucose, Bld 153 (H) 70 - 99 mg/dL    Comment: Glucose reference range applies only to samples taken after fasting for at least 8 hours.   BUN 18 8 - 23 mg/dL   Creatinine, Ser 2.84 0.44 - 1.00 mg/dL   Calcium 8.6 (L) 8.9 - 10.3 mg/dL   Total Protein 8.2 (H) 6.5 - 8.1 g/dL   Albumin 3.6 3.5 - 5.0 g/dL   AST 38 15 - 41 U/L   ALT 32 0 - 44 U/L   Alkaline Phosphatase 97 38 - 126 U/L   Total Bilirubin 0.7 0.3 - 1.2 mg/dL   GFR, Estimated >13 >24 mL/min    Comment: (NOTE) Calculated using the CKD-EPI Creatinine Equation (2021)    Anion gap 14 5 - 15    Comment: Performed at [facility], [address] [place], [place] [address]  CBC with Differential     Status: Abnormal   Collection Time: 04/21/23  1:33 PM  Result Value Ref Range   WBC 13.6 (H) 4.0 - 10.5 K/uL   RBC 4.71 3.87 - 5.11 MIL/uL   Hemoglobin 13.2 12.0 - 15.0 g/dL   HCT 72.5 36.6 - 44.0 %   MCV 85.1 80.0 - 100.0 fL   MCH 28.0 26.0 - 34.0 pg   MCHC 32.9 30.0 - 36.0 g/dL   RDW  34.7 42.5 - 95.6 %   Platelets 406 (H) 150 - 400 K/uL   nRBC 0.0 0.0 - 0.2 %   Neutrophils Relative % 86 %   Neutro Abs 11.7 (H) 1.7 - 7.7 K/uL   Lymphocytes Relative 7 %   Lymphs Abs 1.0 0.7 - 4.0 K/uL   Monocytes Relative 6 %   Monocytes Absolute 0.8 0.1 - 1.0 K/uL   Eosinophils Relative 0 %   Eosinophils Absolute 0.0 0.0 - 0.5 K/uL   Basophils Relative 0 %   Basophils Absolute 0.0 0.0 - 0.1 K/uL   Immature Granulocytes 1 %   Abs Immature Granulocytes 0.07 0.00 - 0.07 K/uL    Comment: Performed at [facility], [address], [place], [place] [address]  I-stat chem 8, ED (not at [facility], [facility] or [facility])     Status: Abnormal   Collection Time: 04/21/23  1:44 PM  Result Value Ref Range   Sodium 134 (L) 135 - 145 mmol/L   Potassium 3.6 3.5 - 5.1 mmol/L   Chloride 95 (L) 98 - 111 mmol/L   BUN 18 8 - 23 mg/dL   Creatinine, Ser 4.33 0.44 - 1.00 mg/dL   Glucose, Bld 295 (H) 70 - 99 mg/dL    Comment: Glucose reference range applies only to samples taken after fasting for at least 8 hours.   Calcium, Ion 1.06 (L) 1.15 - 1.40 mmol/L   TCO2 25 22 - 32 mmol/L   Hemoglobin 14.3 12.0 - 15.0 g/dL   HCT 18.8 41.6 - 60.6 %  I-Stat CG4 Lactic Acid     Status: None   Collection Time: 04/21/23  4:58 PM  Result Value Ref Range   Lactic Acid, Venous 1.5 0.5 - 1.9 mmol/L   CT ABDOMEN PELVIS W CONTRAST  Result Date: 04/21/2023 CLINICAL DATA:  Groin lymphadenopathy. EXAM: CT ABDOMEN AND PELVIS WITH CONTRAST TECHNIQUE: Multidetector CT imaging of the abdomen and pelvis was performed using the standard protocol following bolus administration of intravenous contrast. RADIATION DOSE REDUCTION: This exam was performed according to the departmental dose-optimization program which includes automated exposure control, adjustment of  the mA and/or kV according to patient size and/or use of iterative reconstruction technique. CONTRAST:  OMNIPAQUE IOHEXOL 300 MG/ML  SOLN COMPARISON:  CT abdomen  pelvis 03/09/2022.  Hip CT scan 10/02/2022. FINDINGS: Lower chest: There is some linear opacity lung bases likely scar or atelectasis. No pleural effusion. Coronary artery calcifications are seen. Please correlate for other coronary risk factors. Hepatobiliary: Known gallstone. Gallbladder is nondilated. Patent portal vein. No space-occupying liver lesion. Pancreas: Unremarkable. No pancreatic ductal dilatation or surrounding inflammatory changes. Spleen: Normal in size without focal abnormality. Adrenals/Urinary Tract: Adrenal glands are preserved. Bilateral parapelvic renal cysts. There is also a Bosniak 2 parenchymal cyst anteriorly with a thin septation. Unchanged from previous. No additional follow-up. No separate enhancing mass or collecting system dilatation. The ureters have normal course and caliber extending down to the urinary bladder which is contracted. Stomach/Bowel: There is a complex fluid collection with the air seen to the right of the rectum and anal canal region this has incompletely included in the imaging field but has a dimensions on axial series 2, image 89 approaching 5.1 x 5.3 cm. Cephalocaudal length estimated at 6.0 cm. There is adjacent severe inflammatory stranding. There is wall thickening of the adjacent bowel and some displacement. This has components above and below the pelvic floor musculature, levator with significant stranding in the presacral space. This is worrisome for an abscess. A fistula communication to the bowel is difficult to confirm on this examination and if needed dedicated fistula protocol MRI could be considered as clinically appropriate. More proximally the large bowel has a normal course and caliber with some scattered stool. Few colonic diverticula. Normal appendix. Surgical changes from gastric bypass. The stomach itself is nondilated. The small bowel is nondilated. Vascular/Lymphatic: Retroaortic left renal vein. Normal caliber aorta and IVC. Scattered  vascular calcifications. No specific abnormal lymph node enlargement identified in the abdomen and pelvis. Reproductive: Status post hysterectomy. No adnexal masses. Other: No free air.  No other fluid collections. Musculoskeletal: Streak artifact related to patient's left hip arthroplasty. Curvature of the spine with moderate degenerative changes. Trace anterolisthesis of L5 on S1 with pars defects. More advanced degenerative changes of the disc space at L5-S1. IMPRESSION: Presumed abscess identified in the low pelvis to the right of the anorectal region measuring 5.1 x 5.3 x 6.0 cm. Fluid and gas collection with the adjacent inflammatory stranding and soft tissue thickening. There is mass effect along the adjacent bowel. This has a component extending across the pelvic floor musculature, levator. Please correlate for any clinical history of a fistula communication to the bowel. Otherwise no bowel obstruction. Separate colonic diverticula. Surgical changes of prior gastric bypass. Gallstone. Electronically Signed   By: Karen Kays M.D.   On: 04/21/2023 18:58    Review of Systems  Gastrointestinal:  Positive for rectal pain.  All other systems reviewed and are negative.   Blood pressure 122/61, pulse 72, temperature 99 F (37.2 C), resp. rate 18, height 5\' 5"  (1.651 m), weight 108.9 kg, SpO2 98%. Physical Exam Cardiovascular:     Rate and Rhythm: Normal rate.  Pulmonary:     Effort: Pulmonary effort is normal.  Abdominal:     General: Abdomen is flat.     Tenderness: There is no abdominal tenderness.  Genitourinary:      Comments: Redness and fluctuance but soft Musculoskeletal:     Cervical back: Normal range of motion.  Skin:    General: Skin is warm.  Neurological:  General: No focal deficit present.     Mental Status: She is alert.  Psychiatric:        Mood and Affect: Mood normal.      Assessment/Plan Recurrent perirectal abscess right  Type 2 diabetes  mellitus  Hypertension  Obesity with BMI 40  Admit for IV hydration, IV antibiotics.  Plan will be for exam under anesthesia and incision and drainage of abscess tomorrow with Dr. Magnus Ivan at his availability.  Discussed with the patient.  N.p.o. after midnight    Moderate complexity  Dortha Schwalbe, MD 04/21/2023, 8:46 PM

## 2023-04-21 NOTE — ED Triage Notes (Signed)
Patient arrives ambulatory by POV c/o rectal pain over past 2-3 days. States September 2023 had a rectal abscess that required surgery.

## 2023-04-21 NOTE — ED Provider Notes (Signed)
[place] EMERGENCY DEPARTMENT AT [facility] Provider Note   CSN: [id number] Arrival date & time: 04/21/23  1146     History  Chief Complaint  Patient presents with   Rectal Pain    Kathleen Russo is a 62 y.o. female hx of HTN, rectal abscess, here with rectal pain.  Patient states that she had a rectal abscess that required surgery about a year ago.  Patient has some chills and also right-sided rectal pain for about 3 to 4 days.  Patient is concerned for possible recurrent abscess.  Patient has a history of diverticulitis as well.  The history is provided by the patient.       Home Medications Prior to Admission medications   Medication Sig Start Date End Date Taking? Authorizing Provider  acetaminophen (TYLENOL) 500 MG tablet Take 2 tablets (1,000 mg total) by mouth every 6 (six) hours. 03/11/22   Adam Phenix, PA-C  amoxicillin (AMOXIL) 500 MG capsule Take 4 capsules 1 hour prior to dental procedure. 01/27/23   Blane Ohara, MD  blood glucose meter kit and supplies KIT Dispense based on patient and insurance preference. Check daily in am. E11.69. 09/26/19   Cox, Fritzi Mandes, MD  cephALEXin (KEFLEX) 500 MG capsule Take 1 capsule (500 mg total) by mouth 3 (three) times daily for 7 days. 04/19/23 04/26/23  Windell Moment, MD  clobetasol ointment (TEMOVATE) 0.05 % Apply 1 application topically 2 (two) times daily. 10/23/20   Cox, Fritzi Mandes, MD  cyanocobalamin (VITAMIN B12) 1000 MCG/ML injection Inject 1 ml weekly for 4 weeks then 1 ml every 4 weeks. 02/27/22   Cox, Fritzi Mandes, MD  cyclobenzaprine (FLEXERIL) 10 MG tablet Take 10 mg by mouth every 6 (six) hours as needed for muscle spasms. 01/20/22   [provider]  fluticasone (FLONASE) 50 MCG/ACT nasal spray Place 2 sprays into both nostrils daily. 08/07/22   Delorse Lek, FNP  levothyroxine (SYNTHROID) 125 MCG tablet TAKE 1 TABLET(125 MCG) BY MOUTH DAILY BEFORE BREAKFAST 03/17/23   Cox, Fritzi Mandes, MD   lisinopril-hydrochlorothiazide (ZESTORETIC) 10-12.5 MG tablet TAKE 1 TABLET BY MOUTH EVERY DAY 03/04/23   Cox, Fritzi Mandes, MD  loratadine (CLARITIN) 10 MG tablet Take 1 tablet (10 mg total) by mouth daily as needed for allergies. 12/01/22   Cox, Fritzi Mandes, MD  meloxicam (MOBIC) 15 MG tablet Take 1 tablet (15 mg total) by mouth daily. 12/17/22   Cox, Fritzi Mandes, MD  montelukast (SINGULAIR) 10 MG tablet TAKE 1 TABLET(10 MG) BY MOUTH AT BEDTIME 05/17/22   Janie Morning, NP  ondansetron (ZOFRAN) 4 MG tablet Take 4 mg by mouth every 8 (eight) hours as needed for nausea or vomiting. 01/15/21   [provider]  [facility] ULTRA test strip USE TO CHECK BLOOD SUGAR ONCE DAILY IN THE MORNING 02/09/20   Cox, Fritzi Mandes, MD  polyethylene glycol powder (MIRALAX) 17 GM/SCOOP powder Take 8.5-34 g by mouth daily. To correct constipation.  Adjust dose over 1-2 months.  Goal = ~1 bowel movement / day 03/11/22   Adam Phenix, PA-C  rosuvastatin (CRESTOR) 10 MG tablet TAKE 1 TABLET(10 MG) BY MOUTH DAILY 03/19/23   Cox, Kirsten, MD  Semaglutide, 2 MG/DOSE, (OZEMPIC, 2 MG/DOSE,) 8 MG/3ML SOPN INJECT 2 MG SUBCUTANEOUS ONCE WEEKLY 03/17/23   Cox, Kirsten, MD  Vitamin D, Ergocalciferol, (DRISDOL) 1.25 MG (50000 UNIT) CAPS capsule TAKE 1 CAPSULE BY MOUTH EVERY 7 DAYS 03/19/23   Blane Ohara, MD      Allergies    Etodolac, Quinolones,  and Tolmetin    Review of Systems   Review of Systems  Gastrointestinal:  Positive for rectal pain.  All other systems reviewed and are negative.   Physical Exam Updated Vital Signs BP 117/65 (BP Location: Left Arm)   Pulse 92   Temp 100 F (37.8 C) (Oral)   Resp 18   Ht 5\' 5"  (1.651 m)   Wt 108.9 kg   SpO2 97%   BMI 39.94 kg/m  Physical Exam Vitals and nursing note reviewed.  Constitutional:      Appearance: Normal appearance.  HENT:     Head: Normocephalic.     Nose: Nose normal.     Mouth/Throat:     Mouth: Mucous membranes are moist.  Eyes:     Extraocular  Movements: Extraocular movements intact.     Pupils: Pupils are equal, round, and reactive to light.  Cardiovascular:     Rate and Rhythm: Normal rate and regular rhythm.     Pulses: Normal pulses.     Heart sounds: Normal heart sounds.  Pulmonary:     Effort: Pulmonary effort is normal.     Breath sounds: Normal breath sounds.  Abdominal:     General: Abdomen is flat.     Palpations: Abdomen is soft.  Genitourinary:    Comments: Rectal-right buttock cellulitis.  Questionable perirectal abscess. Musculoskeletal:     Cervical back: Normal range of motion and neck supple.  Neurological:     General: No focal deficit present.     Mental Status: She is alert.  Psychiatric:        Mood and Affect: Mood normal.        Behavior: Behavior normal.     ED Results / Procedures / Treatments   Labs (all labs ordered are listed, but only abnormal results are displayed) Labs Reviewed  COMPREHENSIVE METABOLIC PANEL - Abnormal; Notable for the following components:      Result Value   Sodium 132 (*)    Chloride 93 (*)    Glucose, Bld 153 (*)    Calcium 8.6 (*)    Total Protein 8.2 (*)    All other components within normal limits  CBC WITH DIFFERENTIAL/PLATELET - Abnormal; Notable for the following components:   WBC 13.6 (*)    Platelets 406 (*)    Neutro Abs 11.7 (*)    All other components within normal limits  I-STAT CHEM 8, ED - Abnormal; Notable for the following components:   Sodium 134 (*)    Chloride 95 (*)    Glucose, Bld 152 (*)    Calcium, Ion 1.06 (*)    All other components within normal limits  CULTURE, BLOOD (ROUTINE X 2)  CULTURE, BLOOD (ROUTINE X 2)  I-STAT CG4 LACTIC ACID, ED  I-STAT CG4 LACTIC ACID, ED    EKG None  Radiology CT ABDOMEN PELVIS W CONTRAST  Result Date: 04/21/2023 CLINICAL DATA:  Groin lymphadenopathy. EXAM: CT ABDOMEN AND PELVIS WITH CONTRAST TECHNIQUE: Multidetector CT imaging of the abdomen and pelvis was performed using the standard  protocol following bolus administration of intravenous contrast. RADIATION DOSE REDUCTION: This exam was performed according to the departmental dose-optimization program which includes automated exposure control, adjustment of the mA and/or kV according to patient size and/or use of iterative reconstruction technique. CONTRAST:  OMNIPAQUE IOHEXOL 300 MG/ML  SOLN COMPARISON:  CT abdomen pelvis 03/09/2022.  Hip CT scan 10/02/2022. FINDINGS: Lower chest: There is some linear opacity lung bases likely scar or atelectasis. No  pleural effusion. Coronary artery calcifications are seen. Please correlate for other coronary risk factors. Hepatobiliary: Known gallstone. Gallbladder is nondilated. Patent portal vein. No space-occupying liver lesion. Pancreas: Unremarkable. No pancreatic ductal dilatation or surrounding inflammatory changes. Spleen: Normal in size without focal abnormality. Adrenals/Urinary Tract: Adrenal glands are preserved. Bilateral parapelvic renal cysts. There is also a Bosniak 2 parenchymal cyst anteriorly with a thin septation. Unchanged from previous. No additional follow-up. No separate enhancing mass or collecting system dilatation. The ureters have normal course and caliber extending down to the urinary bladder which is contracted. Stomach/Bowel: There is a complex fluid collection with the air seen to the right of the rectum and anal canal region this has incompletely included in the imaging field but has a dimensions on axial series 2, image 89 approaching 5.1 x 5.3 cm. Cephalocaudal length estimated at 6.0 cm. There is adjacent severe inflammatory stranding. There is wall thickening of the adjacent bowel and some displacement. This has components above and below the pelvic floor musculature, levator with significant stranding in the presacral space. This is worrisome for an abscess. A fistula communication to the bowel is difficult to confirm on this examination and if needed dedicated  fistula protocol MRI could be considered as clinically appropriate. More proximally the large bowel has a normal course and caliber with some scattered stool. Few colonic diverticula. Normal appendix. Surgical changes from gastric bypass. The stomach itself is nondilated. The small bowel is nondilated. Vascular/Lymphatic: Retroaortic left renal vein. Normal caliber aorta and IVC. Scattered vascular calcifications. No specific abnormal lymph node enlargement identified in the abdomen and pelvis. Reproductive: Status post hysterectomy. No adnexal masses. Other: No free air.  No other fluid collections. Musculoskeletal: Streak artifact related to patient's left hip arthroplasty. Curvature of the spine with moderate degenerative changes. Trace anterolisthesis of L5 on S1 with pars defects. More advanced degenerative changes of the disc space at L5-S1. IMPRESSION: Presumed abscess identified in the low pelvis to the right of the anorectal region measuring 5.1 x 5.3 x 6.0 cm. Fluid and gas collection with the adjacent inflammatory stranding and soft tissue thickening. There is mass effect along the adjacent bowel. This has a component extending across the pelvic floor musculature, levator. Please correlate for any clinical history of a fistula communication to the bowel. Otherwise no bowel obstruction. Separate colonic diverticula. Surgical changes of prior gastric bypass. Gallstone. Electronically Signed   By: Karen Kays M.D.   On: 04/21/2023 18:58    Procedures Procedures    CRITICAL CARE Performed by: Richardean Canal   Total critical care time: 40 minutes  Critical care time was exclusive of separately billable procedures and treating other patients.  Critical care was necessary to treat or prevent imminent or life-threatening deterioration.  Critical care was time spent personally by me on the following activities: development of treatment plan with patient and/or surrogate as well as nursing,  discussions with consultants, evaluation of patient's response to treatment, examination of patient, obtaining history from patient or surrogate, ordering and performing treatments and interventions, ordering and review of laboratory studies, ordering and review of radiographic studies, pulse oximetry and re-evaluation of patient's condition.   Medications Ordered in ED Medications  piperacillin-tazobactam (ZOSYN) IVPB 3.375 g (0 g Intravenous Stopped 04/21/23 1901)  morphine (PF) 4 MG/ML injection 4 mg (4 mg Intravenous Given 04/21/23 1744)  sodium chloride 0.9 % bolus 1,000 mL (1,000 mLs Intravenous New Bag/Given 04/21/23 1744)  metroNIDAZOLE (FLAGYL) IVPB 500 mg (500 mg Intravenous New Bag/Given  04/21/23 1750)  iohexol (OMNIPAQUE) 300 MG/ML solution 100 mL (100 mLs Intravenous Contrast Given 04/21/23 1753)    ED Course/ Medical Decision Making/ A&P                                 Medical Decision Making Aira Neva Seat Aloise is a 62 y.o. female here presenting with possible perirectal abscess.  Patient had history of perirectal abscesses require admission and I&D.  Plan to get CBC and CMP and CT abdomen pelvis.  Will give empiric antibiotics.  7:01 PM I reviewed patient's labs and patient had a 5 x 5 x 6 cm perirectal abscess.  Consulted surgery.   7:17 PM Talked to Dr. Luisa Hart from surgery. He agreed with IV antibiotics.  He will admit the patient to the surgery service and anticipate OR tomorrow  Problems Addressed: Perirectal abscess: acute illness or injury  Amount and/or Complexity of Data Reviewed Labs: ordered. Decision-making details documented in ED Course. Radiology: ordered and independent interpretation performed. Decision-making details documented in ED Course.  Risk Prescription drug management. Decision regarding hospitalization.    Final Clinical Impression(s) / ED Diagnoses Final diagnoses:  None    Rx / DC Orders ED Discharge Orders     None          Charlynne Pander, MD 04/21/23 [phone]

## 2023-04-21 NOTE — ED Provider Triage Note (Addendum)
Emergency Medicine Provider Triage Evaluation Note  Kathleen Russo , a 62 y.o. female  was evaluated in triage.  Pt complains of perirectal abscess.  Review of Systems  Positive:  Negative:   Physical Exam  BP (!) 141/83 (BP Location: Right Arm)   Pulse 87   Temp 99.6 F (37.6 C) (Oral)   Resp 16   Ht 5\' 5"  (1.651 m)   Wt 108.9 kg   SpO2 97%   BMI 39.94 kg/m  Gen:   Awake, no distress   Resp:  Normal effort  MSK:   Moves extremities without difficulty  Other:    Medical Decision Making  Medically screening exam initiated at 12:06 PM.  Appropriate orders placed.  Kathleen Russo was informed that the remainder of the evaluation will be completed by another provider, this initial triage assessment does not replace that evaluation, and the importance of remaining in the ED until their evaluation is complete.  Hx of perirectal abscess treated by general surgery 02/2022. States that this area started being painful 2 days ago. Concerned that abscess is happening again. Endorses severe chills. Also endorses diarrhea but states that she has been taking Miralax. Denies chest pain, dyspnea, nausea, vomiting.  Of note, patient stating that she is currently taking ABX for UTI.  Ordering CT and basic labs.      Valrie Hart F, [place] 04/21/23 1210

## 2023-04-22 ENCOUNTER — Inpatient Hospital Stay (HOSPITAL_COMMUNITY): Payer: BC Managed Care – PPO | Admitting: Anesthesiology

## 2023-04-22 ENCOUNTER — Encounter (HOSPITAL_COMMUNITY): Payer: Self-pay

## 2023-04-22 ENCOUNTER — Encounter (HOSPITAL_COMMUNITY)
Admission: EM | Disposition: A | Discharge status: Home / Self Care | Payer: Self-pay | Source: Home / Self Care | Attending: Emergency Medicine

## 2023-04-22 ENCOUNTER — Other Ambulatory Visit: Payer: Self-pay

## 2023-04-22 HISTORY — PX: INCISION AND DRAINAGE PERIRECTAL ABSCESS: SHX1804

## 2023-04-22 LAB — HIV ANTIBODY (ROUTINE TESTING W REFLEX): HIV Screen 4th Generation wRfx: NONREACTIVE

## 2023-04-22 LAB — GLUCOSE, CAPILLARY
Glucose-Capillary: 120 mg/dL — ABNORMAL HIGH (ref 70–99)
Glucose-Capillary: 116 mg/dL — ABNORMAL HIGH (ref 70–99)
Glucose-Capillary: 89 mg/dL (ref 70–99)
Glucose-Capillary: 144 mg/dL — ABNORMAL HIGH (ref 70–99)

## 2023-04-22 SURGERY — INCISION AND DRAINAGE, ABSCESS, PERIRECTAL
Anesthesia: General

## 2023-04-22 MED ORDER — ACETAMINOPHEN 500 MG PO TABS: 1000.0000 mg | ORAL_TABLET | Freq: Four times a day (QID) | ORAL | 3 refills | Status: AC

## 2023-04-22 MED ORDER — ACETAMINOPHEN 160 MG/5ML PO SOLN: 325.0000 mg | ORAL | Status: DC | PRN

## 2023-04-22 MED ORDER — ONDANSETRON HCL 4 MG/2ML IJ SOLN
INTRAMUSCULAR | Status: AC
  Filled 2023-04-22: qty 2

## 2023-04-22 MED ORDER — HYDROCODONE-ACETAMINOPHEN 5-325 MG PO TABS: 1.0000 | ORAL_TABLET | ORAL | 0 refills | Status: DC | PRN

## 2023-04-22 MED ORDER — CHLORHEXIDINE GLUCONATE 0.12 % MT SOLN
15.0000 mL | OROMUCOSAL | Status: AC
  Filled 2023-04-22: qty 15

## 2023-04-22 MED ORDER — PHENYLEPHRINE 80 MCG/ML (10ML) SYRINGE FOR IV PUSH (FOR BLOOD PRESSURE SUPPORT)
PREFILLED_SYRINGE | INTRAVENOUS | Status: DC | PRN
  Administered 2023-04-22 (×2): 80 ug via INTRAVENOUS
  Administered 2023-04-22: 160 ug via INTRAVENOUS
  Administered 2023-04-22: 80 ug via INTRAVENOUS

## 2023-04-22 MED ORDER — HYDROCODONE-ACETAMINOPHEN 5-325 MG PO TABS: 1.0000 | ORAL_TABLET | ORAL | Status: DC | PRN

## 2023-04-22 MED ORDER — MEPERIDINE HCL 25 MG/ML IJ SOLN: 6.2500 mg | INTRAMUSCULAR | Status: DC | PRN

## 2023-04-22 MED ORDER — ROCURONIUM BROMIDE 10 MG/ML (PF) SYRINGE
PREFILLED_SYRINGE | INTRAVENOUS | Status: DC | PRN
  Administered 2023-04-22: 30 mg via INTRAVENOUS

## 2023-04-22 MED ORDER — SUCCINYLCHOLINE CHLORIDE 200 MG/10ML IV SOSY
PREFILLED_SYRINGE | INTRAVENOUS | Status: AC
  Filled 2023-04-22: qty 10

## 2023-04-22 MED ORDER — DOCUSATE SODIUM 100 MG PO CAPS: 100.0000 mg | ORAL_CAPSULE | Freq: Two times a day (BID) | ORAL | 2 refills | Status: DC

## 2023-04-22 MED ORDER — ROCURONIUM BROMIDE 10 MG/ML (PF) SYRINGE
PREFILLED_SYRINGE | INTRAVENOUS | Status: AC
  Filled 2023-04-22: qty 10

## 2023-04-22 MED ORDER — OXYCODONE HCL 5 MG/5ML PO SOLN: 5.0000 mg | Freq: Once | ORAL | Status: DC | PRN

## 2023-04-22 MED ORDER — FENTANYL CITRATE (PF) 250 MCG/5ML IJ SOLN
INTRAMUSCULAR | Status: DC | PRN
  Administered 2023-04-22: 50 ug via INTRAVENOUS

## 2023-04-22 MED ORDER — MIDAZOLAM HCL 2 MG/2ML IJ SOLN
INTRAMUSCULAR | Status: AC
  Filled 2023-04-22: qty 2

## 2023-04-22 MED ORDER — FENTANYL CITRATE (PF) 250 MCG/5ML IJ SOLN
INTRAMUSCULAR | Status: AC
  Filled 2023-04-22: qty 5

## 2023-04-22 MED ORDER — LIDOCAINE 2% (20 MG/ML) 5 ML SYRINGE
INTRAMUSCULAR | Status: AC
  Filled 2023-04-22: qty 5

## 2023-04-22 MED ORDER — DEXAMETHASONE SODIUM PHOSPHATE 10 MG/ML IJ SOLN
INTRAMUSCULAR | Status: AC
  Filled 2023-04-22: qty 1

## 2023-04-22 MED ORDER — PIPERACILLIN-TAZOBACTAM 3.375 G IVPB
3.3750 g | Freq: Three times a day (TID) | INTRAVENOUS | Status: DC
  Administered 2023-04-22 (×2): 3.375 g via INTRAVENOUS
  Filled 2023-04-22 (×2): qty 50

## 2023-04-22 MED ORDER — PROPOFOL 10 MG/ML IV BOLUS
INTRAVENOUS | Status: DC | PRN
  Administered 2023-04-22: 140 mg via INTRAVENOUS
  Administered 2023-04-22: 150 ug/kg/min via INTRAVENOUS
  Administered 2023-04-22: 60 ug via INTRAVENOUS

## 2023-04-22 MED ORDER — BUPIVACAINE LIPOSOME 1.3 % IJ SUSP
INTRAMUSCULAR | Status: AC
  Filled 2023-04-22: qty 20

## 2023-04-22 MED ORDER — OXYCODONE HCL 5 MG PO TABS: 5.0000 mg | ORAL_TABLET | Freq: Once | ORAL | Status: DC | PRN

## 2023-04-22 MED ORDER — LIDOCAINE 2% (20 MG/ML) 5 ML SYRINGE
INTRAMUSCULAR | Status: DC | PRN
  Administered 2023-04-22: 100 mg via INTRAVENOUS

## 2023-04-22 MED ORDER — METHOCARBAMOL 750 MG PO TABS
750.0000 mg | ORAL_TABLET | Freq: Four times a day (QID) | ORAL | 1 refills | Status: AC
  Filled 2023-12-03: qty 120, 30d supply, fill #0

## 2023-04-22 MED ORDER — DEXAMETHASONE SODIUM PHOSPHATE 10 MG/ML IJ SOLN
INTRAMUSCULAR | Status: DC | PRN
  Administered 2023-04-22: 10 mg via INTRAVENOUS

## 2023-04-22 MED ORDER — FENTANYL CITRATE (PF) 100 MCG/2ML IJ SOLN: 25.0000 ug | INTRAMUSCULAR | Status: DC | PRN

## 2023-04-22 MED ORDER — MIDAZOLAM HCL 2 MG/2ML IJ SOLN
INTRAMUSCULAR | Status: DC | PRN
  Administered 2023-04-22 (×2): 1 mg via INTRAVENOUS

## 2023-04-22 MED ORDER — ACETAMINOPHEN 325 MG PO TABS: 325.0000 mg | ORAL_TABLET | ORAL | Status: DC | PRN

## 2023-04-22 MED ORDER — 0.9 % SODIUM CHLORIDE (POUR BTL) OPTIME
TOPICAL | Status: DC | PRN
  Administered 2023-04-22: 1000 mL

## 2023-04-22 MED ORDER — ONDANSETRON HCL 4 MG/2ML IJ SOLN: 4.0000 mg | Freq: Once | INTRAMUSCULAR | Status: DC | PRN

## 2023-04-22 MED ORDER — OXYCODONE HCL 5 MG PO TABS: 5.0000 mg | ORAL_TABLET | ORAL | 0 refills | Status: DC | PRN

## 2023-04-22 MED ORDER — PROPOFOL 10 MG/ML IV BOLUS
INTRAVENOUS | Status: AC
  Filled 2023-04-22: qty 20

## 2023-04-22 MED ORDER — SUGAMMADEX SODIUM 200 MG/2ML IV SOLN
INTRAVENOUS | Status: DC | PRN
  Administered 2023-04-22: 200 mg via INTRAVENOUS

## 2023-04-22 MED ORDER — CHLORHEXIDINE GLUCONATE 0.12 % MT SOLN
OROMUCOSAL | Status: AC
  Administered 2023-04-22: 15 mL via OROMUCOSAL
  Filled 2023-04-22: qty 15

## 2023-04-22 MED ORDER — SODIUM CHLORIDE 0.9 % IV SOLN: INTRAVENOUS | Status: DC | PRN

## 2023-04-22 SURGICAL SUPPLY — 32 items
BAG COUNTER SPONGE SURGICOUNT (BAG) ×1 IMPLANT
BAG SPNG CNTER NS LX DISP (BAG) ×1
CANISTER SUCT 3000ML PPV (MISCELLANEOUS) ×1 IMPLANT
COVER SURGICAL LIGHT HANDLE (MISCELLANEOUS) ×1 IMPLANT
DRAPE UTILITY XL STRL (DRAPES) ×1 IMPLANT
ELECT REM PT RETURN 9FT ADLT (ELECTROSURGICAL)
ELECTRODE REM PT RTRN 9FT ADLT (ELECTROSURGICAL) IMPLANT
GAUZE 4X4 16PLY ~~LOC~~+RFID DBL (SPONGE) ×1 IMPLANT
GAUZE PACKING IODOFORM 2 (PACKING) IMPLANT
GAUZE PAD ABD 7.5X8 STRL (GAUZE/BANDAGES/DRESSINGS) IMPLANT
GAUZE PAD ABD 8X10 STRL (GAUZE/BANDAGES/DRESSINGS) ×1 IMPLANT
GAUZE SPONGE 4X4 12PLY STRL (GAUZE/BANDAGES/DRESSINGS) ×1 IMPLANT
GLOVE BIO SURGEON STRL SZ8 (GLOVE) ×1 IMPLANT
GLOVE BIOGEL PI IND STRL 8 (GLOVE) ×1 IMPLANT
GOWN STRL REUS W/ TWL LRG LVL3 (GOWN DISPOSABLE) ×2 IMPLANT
GOWN STRL REUS W/ TWL XL LVL3 (GOWN DISPOSABLE) ×1 IMPLANT
GOWN STRL REUS W/TWL LRG LVL3 (GOWN DISPOSABLE) ×2
GOWN STRL REUS W/TWL XL LVL3 (GOWN DISPOSABLE) ×1
KIT BASIN OR (CUSTOM PROCEDURE TRAY) ×1 IMPLANT
KIT TURNOVER KIT B (KITS) ×1 IMPLANT
NS IRRIG 1000ML POUR BTL (IV SOLUTION) ×1 IMPLANT
PACK LITHOTOMY IV (CUSTOM PROCEDURE TRAY) ×1 IMPLANT
PACKING GAUZE IODOFORM 1INX5YD (GAUZE/BANDAGES/DRESSINGS) IMPLANT
PAD ARMBOARD 7.5X6 YLW CONV (MISCELLANEOUS) ×2 IMPLANT
PENCIL SMOKE EVACUATOR (MISCELLANEOUS) IMPLANT
SWAB COLLECTION DEVICE MRSA (MISCELLANEOUS) ×1 IMPLANT
SWAB CULTURE ESWAB REG 1ML (MISCELLANEOUS) ×1 IMPLANT
TOWEL GREEN STERILE (TOWEL DISPOSABLE) ×1 IMPLANT
TOWEL GREEN STERILE FF (TOWEL DISPOSABLE) ×1 IMPLANT
TUBE CONNECTING 12X1/4 (SUCTIONS) ×1 IMPLANT
UNDERPAD 30X36 HEAVY ABSORB (UNDERPADS AND DIAPERS) ×1 IMPLANT
YANKAUER SUCT BULB TIP NO VENT (SUCTIONS) ×1 IMPLANT

## 2023-04-22 NOTE — Discharge Instructions (Addendum)
[facility]  SURGERY: POST OP INSTRUCTIONS Always review your discharge instruction sheet given to you by the facility where your surgery was performed. IF YOU HAVE DISABILITY OR FAMILY LEAVE FORMS, YOU MUST BRING THEM TO THE OFFICE FOR PROCESSING.   DO NOT GIVE THEM TO YOUR DOCTOR.  PAIN CONTROL  Pain regimen: take over-the-counter tylenol (acetaminophen) 1000mg  every six hours and the robaxin (methocarbamol) 750mg  every six hours. With both of these, you should be taking something every three hours. Example: tylenol ( acetaminophen) at 9am, robaxin (methocarbamol) at 12pm, tylenol (acetaminophen) again at 3pm, robaxin (methocarbamol) at 6pm. You also have a prescription for oxycodone, which should be taken if the tylenol (acetaminophen), ibuprofen, and robaxin (methocarbamol) are not enough to control your pain. You may take the oxycodone as frequently as every four hours as needed, but if you are taking the other medications as above, you should not need the oxycodone this frequently. You have also been given a prescription for colace (docusate) which is a stool softener. Please take this as prescribed because the oxycodone can cause constipation and the colace (docusate) will minimize or prevent constipation. Do not drive while taking or under the influence of the oxycodone as it is a narcotic medication. Use ice packs to help control pain. If you need a refill on your pain medication, please contact your pharmacy.  They will contact our office to request authorization. Prescriptions will not be filled after 5pm or on week-ends.  HOME MEDICATIONS Take your usually prescribed medications unless otherwise directed.  DIET You should follow a light diet the first few days after arrival home.  Be sure to include lots of fluids daily.   CONSTIPATION It is common to experience some constipation after surgery and if you are taking pain medication.  Increasing fluid intake and  taking a stool softener (such as Colace) will usually help or prevent this problem from occurring.  A mild laxative (Milk of Magnesia or Miralax) should be taken according to package instructions if there are no bowel movements after 48 hours.  WOUND/INCISION CARE Most patients will experience some swelling and bruising in the area of the incisions. Sitz baths will help.  Swelling and bruising can take several days to resolve.  Remove ~5 inches of packing on 11/1, ~5inches of packing on 11/2, and remove all remaining packing on 11/3 May shower once all packing is removed.  Do not soak in any water (tubs, hot tubs, pools, lakes, oceans) until approved by your doctor  ACTIVITIES You may resume regular (light) daily activities beginning the next day--such as daily self-care, walking, climbing stairs--gradually increasing activities as tolerated.  You may have sexual intercourse when it is comfortable.   No lifting greater than 5 pounds for six weeks.  You may drive when you are no longer taking narcotic pain medication, you can comfortably wear a seatbelt, and you can safely maneuver your car and apply brakes.  FOLLOW-UP You should see your doctor in the office for a follow-up appointment approximately 2-3 weeks after your surgery.  You should have been given your post-op/follow-up appointment when your surgery was scheduled.  If you did not receive a post-op/follow-up appointment, make sure that you call for this appointment within a day or two after you arrive home to insure a convenient appointment time.  WHEN TO CALL YOUR DOCTOR: Fever over 101.5 Inability to urinate Continued bleeding from incision. Increased pain, redness, or drainage from the incision. Increasing abdominal pain  The  clinic staff is available to answer your questions during regular business hours.  Please don't hesitate to call and ask to speak to one of the nurses for clinical concerns.  If you have a medical emergency, go  to the nearest emergency room or call 911.  A surgeon from [facility] is always on call at the hospital. [address], [address], [place], [place]  [address] ? [address] [address], [place], [place]   [address] [phone] ? [phone] ? FAX [phone] Web site: [email or web address]

## 2023-04-22 NOTE — Plan of Care (Signed)
  Problem: Activity: Goal: Risk for activity intolerance will decrease Outcome: Progressing   Problem: Pain Management: Goal: General experience of comfort will improve Outcome: Progressing

## 2023-04-22 NOTE — Anesthesia Preprocedure Evaluation (Signed)
Anesthesia Evaluation  Patient identified by MRN, date of birth, ID band Patient awake    Reviewed: Allergy & Precautions, NPO status , Patient's Chart, lab work & pertinent test results  History of Anesthesia Complications (+) PONV and history of anesthetic complications  Airway Mallampati: II  TM Distance: >3 FB Neck ROM: Full    Dental  (+) Dental Advisory Given, Teeth Intact   Pulmonary sleep apnea    Pulmonary exam normal        Cardiovascular hypertension, Pt. on medications Normal cardiovascular exam     Neuro/Psych  PSYCHIATRIC DISORDERS      negative neurological ROS     GI/Hepatic Neg liver ROS,GERD  Controlled,, Ozempic, last dose 5 days ago    Endo/Other  diabetes, Type 2Hypothyroidism  Morbid obesity  Renal/GU Renal InsufficiencyRenal disease Bladder dysfunction      Musculoskeletal  (+) Arthritis , Osteoarthritis and Rheumatoid disorders,    Abdominal   Peds  Hematology  (+) Blood dyscrasia, anemia   Anesthesia Other Findings   Reproductive/Obstetrics                             Anesthesia Physical Anesthesia Plan  ASA: 3 and emergent  Anesthesia Plan: General   Post-op Pain Management: Ofirmev IV (intra-op)*   Induction: Intravenous, Rapid sequence and Cricoid pressure planned  PONV Risk Score and Plan: 4 or greater and Treatment may vary due to age or medical condition, Ondansetron, Dexamethasone and Midazolam  Airway Management Planned: Oral ETT  Additional Equipment: None  Intra-op Plan:   Post-operative Plan: Extubation in OR  Informed Consent: I have reviewed the patients History and Physical, chart, labs and discussed the procedure including the risks, benefits and alternatives for the proposed anesthesia with the patient or authorized representative who has indicated his/her understanding and acceptance.     Dental advisory given  Plan  Discussed with: CRNA and Anesthesiologist  Anesthesia Plan Comments:         Anesthesia Quick Evaluation

## 2023-04-22 NOTE — Plan of Care (Signed)
Pt received back from the PACU, alert and oriented x 4. No c/o pain.

## 2023-04-22 NOTE — Anesthesia Postprocedure Evaluation (Signed)
Anesthesia Post Note  Patient: Kathleen Russo  Procedure(s) Performed: IRRIGATION AND DEBRIDEMENT PERIRECTAL ABSCESS     Patient location during evaluation: PACU Anesthesia Type: General Level of consciousness: awake and alert Pain management: pain level controlled Vital Signs Assessment: post-procedure vital signs reviewed and stable Respiratory status: spontaneous breathing, nonlabored ventilation, respiratory function stable and patient connected to nasal cannula oxygen Cardiovascular status: blood pressure returned to baseline and stable Postop Assessment: no apparent nausea or vomiting Anesthetic complications: no  No notable events documented.  Last Vitals:  Vitals:   04/22/23 1405 04/22/23 1415  BP: (!) 104/57 108/61  Pulse: 70 68  Resp: 20 14  Temp: 36.7 C   SpO2: 99% 97%    Last Pain:  Vitals:   04/22/23 1405  TempSrc:   PainSc: 0-No pain                 Bowen Goyal

## 2023-04-22 NOTE — Op Note (Signed)
   Operative Note   Date: 04/22/2023  Procedure: perirectal abscess  Pre-op diagnosis: perirectal abscess Post-op diagnosis: same  Indication and clinical history: The patient is a 62 y.o. year old female with peri-rectal abscess     Surgeon: Diamantina Monks, MD  Anesthesiologist: Tacy Dura, MD Anesthesia: General  Findings:  Specimen: aerobic and anaerobic cultures EBL: <5cc Drains/Implants: iodoform packing  Disposition: PACU - hemodynamically stable.  Description of procedure: The patient was positioned supine on the operating room table. General anesthetic induction and intubation were uneventful. Foley catheter insertion was performed and was atraumatic. Time-out was performed verifying correct patient, procedure, signature of informed consent, and administration of pre-operative antibiotics. The perineum was prepped and draped in the usual sterile fashion.  A curvilinear incision was made over the right buttock at approximately 7-8 o'clock and deepened until the abscess cavity was reached. Aerobic and anaerobic cultures were taken and sent for Gram stain and culture. The abscess cavity was digitally explored, completely evacuated of pus, and irrigated copiously. The wound and abscess cavity was packed with 2 inch iodoform gauze and dressed.   All sponge and instrument counts were correct at the conclusion of the procedure. The patient was awakened from anesthesia, extubated uneventfully, and transported to the PACU in good condition. There were no complications.   Upon entering the abdomen (organ space), I encountered an abscess in the peri-rectal space .  CASE DATA:  Type of patient?: DOW CASE (Surgical Hospitalist [facility])  Status of Case? URGENT Add On  Infection Present At Time Of Surgery (PATOS)?  INFECTION of the peri-rectal space    Diamantina Monks, MD General and Trauma Surgery [facility]

## 2023-04-22 NOTE — Progress Notes (Signed)
General Surgery Follow Up Note  Subjective:    Overnight Issues:   Objective:  Vital signs for last 24 hours: Temp:  [98.3 F (36.8 C)-100 F (37.8 C)] 98.3 F (36.8 C) (10/31 1218) Pulse Rate:  [65-92] 65 (10/31 1218) Resp:  [16-18] 16 (10/31 1218) BP: (102-122)/(44-65) 110/61 (10/31 1218) SpO2:  [94 %-98 %] 98 % (10/31 1218) Weight:  [108.9 kg] 108.9 kg (10/31 1218)  Hemodynamic parameters for last 24 hours:    Intake/Output from previous day: 10/30 0701 - 10/31 0700 In: 90.6 [IV Piggyback:90.6] Out: -   Intake/Output this shift: No intake/output data recorded.  Vent settings for last 24 hours:    Physical Exam:  Gen: comfortable, no distress Neuro: follows commands, alert, communicative HEENT: PERRL Neck: supple CV: RRR Pulm: unlabored breathing on RA Abd: soft, NT     Results for orders placed or performed during the hospital encounter of 04/21/23 (from the past 24 hour(s))  Comprehensive metabolic panel     Status: Abnormal   Collection Time: 04/21/23  1:33 PM  Result Value Ref Range   Sodium 132 (L) 135 - 145 mmol/L   Potassium 3.5 3.5 - 5.1 mmol/L   Chloride 93 (L) 98 - 111 mmol/L   CO2 25 22 - 32 mmol/L   Glucose, Bld 153 (H) 70 - 99 mg/dL   BUN 18 8 - 23 mg/dL   Creatinine, Ser 5.63 0.44 - 1.00 mg/dL   Calcium 8.6 (L) 8.9 - 10.3 mg/dL   Total Protein 8.2 (H) 6.5 - 8.1 g/dL   Albumin 3.6 3.5 - 5.0 g/dL   AST 38 15 - 41 U/L   ALT 32 0 - 44 U/L   Alkaline Phosphatase 97 38 - 126 U/L   Total Bilirubin 0.7 0.3 - 1.2 mg/dL   GFR, Estimated >87 >56 mL/min   Anion gap 14 5 - 15  CBC with Differential     Status: Abnormal   Collection Time: 04/21/23  1:33 PM  Result Value Ref Range   WBC 13.6 (H) 4.0 - 10.5 K/uL   RBC 4.71 3.87 - 5.11 MIL/uL   Hemoglobin 13.2 12.0 - 15.0 g/dL   HCT 43.3 29.5 - 18.8 %   MCV 85.1 80.0 - 100.0 fL   MCH 28.0 26.0 - 34.0 pg   MCHC 32.9 30.0 - 36.0 g/dL   RDW 41.6 60.6 - 30.1 %   Platelets 406 (H) 150 - 400 K/uL    nRBC 0.0 0.0 - 0.2 %   Neutrophils Relative % 86 %   Neutro Abs 11.7 (H) 1.7 - 7.7 K/uL   Lymphocytes Relative 7 %   Lymphs Abs 1.0 0.7 - 4.0 K/uL   Monocytes Relative 6 %   Monocytes Absolute 0.8 0.1 - 1.0 K/uL   Eosinophils Relative 0 %   Eosinophils Absolute 0.0 0.0 - 0.5 K/uL   Basophils Relative 0 %   Basophils Absolute 0.0 0.0 - 0.1 K/uL   Immature Granulocytes 1 %   Abs Immature Granulocytes 0.07 0.00 - 0.07 K/uL  I-stat chem 8, ED (not at [facility], [facility] or [facility])     Status: Abnormal   Collection Time: 04/21/23  1:44 PM  Result Value Ref Range   Sodium 134 (L) 135 - 145 mmol/L   Potassium 3.6 3.5 - 5.1 mmol/L   Chloride 95 (L) 98 - 111 mmol/L   BUN 18 8 - 23 mg/dL   Creatinine, Ser 6.01 0.44 - 1.00 mg/dL   Glucose, Bld  152 (H) 70 - 99 mg/dL   Calcium, Ion 1.30 (L) 1.15 - 1.40 mmol/L   TCO2 25 22 - 32 mmol/L   Hemoglobin 14.3 12.0 - 15.0 g/dL   HCT 86.5 78.4 - 69.6 %  Blood culture (routine x 2)     Status: None (Preliminary result)   Collection Time: 04/21/23  4:50 PM   Specimen: BLOOD  Result Value Ref Range   Specimen Description      BLOOD LEFT ANTECUBITAL Performed at [facility], [address], [place], [place] [address]    Special Requests      BOTTLES DRAWN AEROBIC AND ANAEROBIC Blood Culture adequate volume Performed at [facility], [address], [place], [place] [address]    Culture      NO GROWTH < 12 HOURS Performed at [facility], [address], [place], [place] [address]    Report Status PENDING   I-Stat CG4 Lactic Acid     Status: None   Collection Time: 04/21/23  4:58 PM  Result Value Ref Range   Lactic Acid, Venous 1.5 0.5 - 1.9 mmol/L  Blood culture (routine x 2)     Status: None (Preliminary result)   Collection Time: 04/21/23  5:44 PM   Specimen: BLOOD  Result Value Ref Range   Specimen Description      BLOOD RIGHT ANTECUBITAL Performed at [facility], [address],  [place], [place] [address]    Special Requests      BOTTLES DRAWN AEROBIC AND ANAEROBIC Blood Culture adequate volume Performed at [facility], [address] [place], [place] [address]    Culture      NO GROWTH < 12 HOURS Performed at [facility], [address] [place], [place] [address]    Report Status PENDING   Glucose, capillary     Status: Abnormal   Collection Time: 04/22/23  7:28 AM  Result Value Ref Range   Glucose-Capillary 120 (H) 70 - 99 mg/dL  HIV Antibody (routine testing w rflx)     Status: None   Collection Time: 04/22/23  9:11 AM  Result Value Ref Range   HIV Screen 4th Generation wRfx Non Reactive Non Reactive  Glucose, capillary     Status: Abnormal   Collection Time: 04/22/23 11:26 AM  Result Value Ref Range   Glucose-Capillary 116 (H) 70 - 99 mg/dL    Assessment & Plan: The plan of care was discussed with the bedside nurse for the day, who is in agreement with this plan and no additional concerns were raised.   Present on Admission:  Perirectal abscess    LOS: 1 day   Additional comments:I reviewed the patient's new clinical lab test results.   and I reviewed the patients new imaging test results.    Perirectal abscess - imaging reviewed and discussed with CRS regarding best approach for drainage. Plan operative I&D today. Informed consent was obtained after detailed explanation of risks, including bleeding, infection, hematoma/seroma, recurrence. All questions answered to the patient's satisfaction. FEN - strict NPO DVT - SCDs, LMWH Dispo - med-surg    Diamantina Monks, MD Trauma & General Surgery Please use [email or web address] to contact on call provider  04/22/2023  *Care during the described time interval was provided by me. I have reviewed this patient's available data, including medical history, events of note, physical examination and test results as part of my evaluation.

## 2023-04-22 NOTE — Progress Notes (Signed)
Pt ready to be discharged to home with husband. AVS copy given and reviewed with pt and husband. Pt understands and has sitz baths at home. Understands how to daily remove packing and follow up with Dr. Bedelia Person in 2 weeks. No further questions about home self care.

## 2023-04-22 NOTE — Anesthesia Procedure Notes (Signed)
Procedure Name: Intubation Date/Time: 04/22/2023 1:30 PM  Performed by: Debbe Odea, CRNAPre-anesthesia Checklist: Patient identified, Emergency Drugs available, Suction available and Patient being monitored Patient Re-evaluated:Patient Re-evaluated prior to induction Oxygen Delivery Method: Circle System Utilized Preoxygenation: Pre-oxygenation with 100% oxygen Induction Type: IV induction Ventilation: Mask ventilation without difficulty Laryngoscope Size: Miller and 2 Grade View: Grade II Tube type: Oral Tube size: 7.0 mm Number of attempts: 1 Airway Equipment and Method: Stylet Placement Confirmation: ETT inserted through vocal cords under direct vision, positive ETCO2 and breath sounds checked- equal and bilateral Secured at: 22 cm Tube secured with: Tape Dental Injury: Teeth and Oropharynx as per pre-operative assessment

## 2023-04-22 NOTE — Transfer of Care (Signed)
Immediate Anesthesia Transfer of Care Note  Patient: Kathleen Russo  Procedure(s) Performed: IRRIGATION AND DEBRIDEMENT PERIRECTAL ABSCESS  Patient Location: PACU  Anesthesia Type:General  Level of Consciousness: awake and sedated  Airway & Oxygen Therapy: Patient Spontanous Breathing and Patient connected to face mask oxygen  Post-op Assessment: Report given to RN and Post -op Vital signs reviewed and stable  Post vital signs: Reviewed and stable  Last Vitals:  Vitals Value Taken Time  BP 104/57 04/22/23 1407  Temp    Pulse 69 04/22/23 1409  Resp 14 04/22/23 1409  SpO2 98 % 04/22/23 1409  Vitals shown include unfiled device data.  Last Pain:  Vitals:   04/22/23 1218  TempSrc: Oral  PainSc: 5       Patients Stated Pain Goal: 0 (04/22/23 1218)  Complications: No notable events documented.

## 2023-04-23 ENCOUNTER — Encounter (HOSPITAL_COMMUNITY): Payer: Self-pay | Admitting: Surgery

## 2023-04-26 LAB — CULTURE, BLOOD (ROUTINE X 2)
Culture: NO GROWTH
Culture: NO GROWTH
Special Requests: ADEQUATE
Special Requests: ADEQUATE

## 2023-04-27 LAB — AEROBIC/ANAEROBIC CULTURE W GRAM STAIN (SURGICAL/DEEP WOUND)

## 2023-04-27 NOTE — Discharge Summary (Signed)
Patient ID: Kathleen Russo [id number] 1960/09/20 62 y.o.  Admit date: 04/21/2023 Discharge date: 04/27/2023  Admitting Diagnosis: Perirectal abscess  Discharge Diagnosis Patient Active Problem List   Diagnosis Date Noted   Dysuria 04/19/2023   Need for shingles vaccine 01/18/2023   Rectal pain 03/09/2022   Tachycardia 03/09/2022   Perirectal abscess 03/09/2022   Non-seasonal allergic rhinitis due to pollen 11/29/2021   Localized swelling of both lower legs 11/29/2021   B12 deficiency 04/29/2021   Vitamin D deficiency 04/29/2021   Chronic fatigue syndrome 04/29/2021   Rheumatoid arthritis (HCC) 04/29/2021   OSA (obstructive sleep apnea)    Breast pain in female 01/03/2020   Mixed hyperlipidemia 09/26/2019   Class 3 severe obesity due to excess calories with serious comorbidity and body mass index (BMI) of 40.0 to 44.9 in adult [facility]) 09/26/2019   History of left hip replacement 08/27/2017   Hypertension associated with diabetes (HCC) 06/08/2017   Hypothyroidism 06/08/2017   Arthritis of left hip 05/12/2017   Chronic left hip pain 01/28/2017   Reduced libido 03/11/2016   OAB (overactive bladder) 04/03/2014    Consultants none  Reason for Admission: Perirectal abscess  Procedures I&D perirectal abscess  Hospital Course:  uncomplicated    Physical Exam: Gen: comfortable, no distress Neuro: non-focal exam HEENT: PERRL Neck: supple CV: RRR Pulm: unlabored breathing Abd: soft, NT GU: s/p I&D and packing of perirectal abscess Extr: wwp, no edema   Allergies as of 04/22/2023       Reactions   Etodolac    swelling   Quinolones    Medications don't absorb   Tolmetin Other (See Comments)   THIS MEDICATION WILL NOT ABSORB D/T GBP Medications don't absorb        Medication List     TAKE these medications    acetaminophen 500 MG tablet Commonly known as: TYLENOL Take 2 tablets (1,000 mg total) by mouth 4 (four) times daily. What changed:  when to take this   calcium carbonate 1500 (600 Ca) MG Tabs tablet Commonly known as: OSCAL Take 600 mg of elemental calcium by mouth daily.   cyanocobalamin 1000 MCG/ML injection Commonly known as: VITAMIN B12 Inject 1 ml weekly for 4 weeks then 1 ml every 4 weeks.   docusate sodium 100 MG capsule Commonly known as: Colace Take 1 capsule (100 mg total) by mouth 2 (two) times daily.   fluticasone 50 MCG/ACT nasal spray Commonly known as: FLONASE Place 2 sprays into both nostrils daily.   HYDROcodone-acetaminophen 5-325 MG tablet Commonly known as: NORCO/VICODIN Take 1 tablet by mouth every 4 (four) hours as needed for moderate pain (pain score 4-6).   hydroxychloroquine 200 MG tablet Commonly known as: PLAQUENIL Take 200 mg by mouth 2 (two) times daily.   levothyroxine 125 MCG tablet Commonly known as: SYNTHROID TAKE 1 TABLET(125 MCG) BY MOUTH DAILY BEFORE BREAKFAST What changed: See the new instructions.   lisinopril-hydrochlorothiazide 10-12.5 MG tablet Commonly known as: ZESTORETIC TAKE 1 TABLET BY MOUTH EVERY DAY   loratadine 10 MG tablet Commonly known as: CLARITIN Take 1 tablet (10 mg total) by mouth daily as needed for allergies.   meloxicam 15 MG tablet Commonly known as: MOBIC Take 1 tablet (15 mg total) by mouth daily.   methocarbamol 750 MG tablet Commonly known as: Robaxin-750 Take 1 tablet (750 mg total) by mouth 4 (four) times daily.   montelukast 10 MG tablet Commonly known as: SINGULAIR TAKE 1 TABLET(10 MG) BY MOUTH AT BEDTIME What  changed: See the new instructions.   Ozempic (2 MG/DOSE) 8 MG/3ML Sopn Generic drug: Semaglutide (2 MG/DOSE) INJECT 2 MG SUBCUTANEOUS ONCE WEEKLY What changed:  how much to take how to take this when to take this additional instructions   polyethylene glycol powder 17 GM/SCOOP powder Commonly known as: MiraLax Take 8.5-34 g by mouth daily. To correct constipation.  Adjust dose over 1-2 months.  Goal = ~1 bowel  movement / day   rosuvastatin 10 MG tablet Commonly known as: CRESTOR TAKE 1 TABLET(10 MG) BY MOUTH DAILY What changed: See the new instructions.   Vitamin D (Ergocalciferol) 1.25 MG (50000 UNIT) Caps capsule Commonly known as: DRISDOL TAKE 1 CAPSULE BY MOUTH EVERY 7 DAYS What changed: additional instructions       ASK your doctor about these medications    cephALEXin 500 MG capsule Commonly known as: KEFLEX Take 1 capsule (500 mg total) by mouth 3 (three) times daily for 7 days. Ask about: Should I take this medication?          Follow-up Information     Diamantina Monks, MD Follow up in 2 week(s).   Specialty: Surgery Why: For wound re-check Contact information: [address] [place] [address] [facility] [place] [address] [phone]                  Signed: Diamantina Monks, MD [facility] 04/27/2023, 6:06 PM

## 2023-05-06 ENCOUNTER — Encounter: Payer: Self-pay | Admitting: Family Medicine

## 2023-05-06 ENCOUNTER — Ambulatory Visit: Payer: BC Managed Care – PPO | Admitting: Family Medicine

## 2023-05-06 VITALS — BP 102/70 | HR 68 | Temp 98.0°F | Resp 16 | Ht 65.0 in | Wt 246.6 lb

## 2023-05-06 DIAGNOSIS — I152 Hypertension secondary to endocrine disorders: Secondary | ICD-10-CM

## 2023-05-06 DIAGNOSIS — E1159 Type 2 diabetes mellitus with other circulatory complications: Secondary | ICD-10-CM

## 2023-05-06 DIAGNOSIS — E782 Mixed hyperlipidemia: Secondary | ICD-10-CM

## 2023-05-06 DIAGNOSIS — Z23 Encounter for immunization: Secondary | ICD-10-CM | POA: Diagnosis not present

## 2023-05-06 DIAGNOSIS — E038 Other specified hypothyroidism: Secondary | ICD-10-CM

## 2023-05-06 DIAGNOSIS — K611 Rectal abscess: Secondary | ICD-10-CM

## 2023-05-06 DIAGNOSIS — M0579 Rheumatoid arthritis with rheumatoid factor of multiple sites without organ or systems involvement: Secondary | ICD-10-CM | POA: Diagnosis not present

## 2023-05-06 NOTE — Assessment & Plan Note (Signed)
Well controlled.  No changes to medicines. Continue rosuvastatin 10 mg before bed  Continue to work on eating a healthy diet and exercise.  Labs drawn today.   

## 2023-05-06 NOTE — Assessment & Plan Note (Signed)
resolving

## 2023-05-06 NOTE — Assessment & Plan Note (Signed)
Diabetes and hypertension well controlled.  No changes to medicines. Lisinopril-hydrochlorothiazide 10-12.5 daily and ozempic 2 mg weekly.  Continue to work on eating a healthy diet and exercise.  Labs drawn today.

## 2023-05-06 NOTE — Assessment & Plan Note (Signed)
Management per specialist. 

## 2023-05-06 NOTE — Progress Notes (Signed)
Subjective:  Patient ID: Kathleen Russo, female    DOB: Nov 11, 1960  Age: 62 y.o. MRN: [id number]  Chief Complaint  Patient presents with   Medical Management of Chronic Issues    HPI Diabetes:  Complications:hypoglycemia. Glucose checking:  three to four times weekly  Glucose logs: 110. Hypoglycemia: none Most recent A1C: 6.7% Current medications: ozempic 2 mg weekly  Last Eye Exam: few months ago.  Foot checks: daily    Hyperlipidemia: Current medications: Rosuvastatin 10 mg daily    Hypertension: Current medications: Lisinopril-hydrochlorothiazide 10-12.5 daily   Hypothyroidism: on synthroid 112 mcg once daily in am.   RA: [facility] rheumatology following. She was on mtx and folate, but these were stopped. Rheumatology stopped meds and are monitoring.   Vitamin D deficiency: taking vitamin D once weekly.   Vitamin B12 deficiency: Monthly injections.    Diet: healthy Exercise: walking.   Recent Hospitalization for perirectal abscess from 04/21/2023 to 04/27/2023. I and D. Was packed, but is nearly healed. No fever or chills. Completed keflex.      05/06/2023    7:34 AM 01/15/2023    7:29 AM 09/04/2022    7:57 AM 07/28/2021   11:22 AM 04/29/2021    7:23 AM  Depression screen PHQ 2/9  Decreased Interest  0 0 0 0  Down, Depressed, Hopeless  0 0 0 0  PHQ - 2 Score  0 0 0 0  Altered sleeping 0 0   0  Tired, decreased energy 0 0   0  Change in appetite 0 0   1  Feeling bad or failure about yourself  0 0   0  Trouble concentrating 0 0   0  Moving slowly or fidgety/restless 0 0   0  Suicidal thoughts 0 0   0  PHQ-9 Score  0   1  Difficult doing work/chores Not difficult at all Not difficult at all           01/15/2023    7:29 AM  Fall Risk   Falls in the past year? 0  Number falls in past yr: 0  Injury with Fall? 0  Risk for fall due to : No Fall Risks  Follow up Falls evaluation completed;Falls prevention discussed    Patient Care Team: Thelma Viana, Fritzi Mandes,  MD as PCP - General (Family Medicine)   Review of Systems  Constitutional:  Negative for chills, fatigue and fever.  HENT:  Negative for congestion, ear pain and sore throat.   Respiratory:  Negative for cough and shortness of breath.   Cardiovascular:  Negative for chest pain.  Gastrointestinal:  Negative for abdominal pain, constipation, diarrhea, nausea and vomiting.  Endocrine: Negative for polydipsia, polyphagia and polyuria.  Genitourinary:  Negative for dysuria and urgency.  Musculoskeletal:  Negative for arthralgias and myalgias.  Skin:  Negative for rash.  Neurological:  Negative for dizziness and headaches.  Psychiatric/Behavioral:  Negative for dysphoric mood. The patient is not nervous/anxious.     Current Outpatient Medications on File Prior to Visit  Medication Sig Dispense Refill   acetaminophen (TYLENOL) 500 MG tablet Take 2 tablets (1,000 mg total) by mouth 4 (four) times daily. 120 tablet 3   calcium carbonate (OSCAL) 1500 (600 Ca) MG TABS tablet Take 600 mg of elemental calcium by mouth daily.     cyanocobalamin (VITAMIN B12) 1000 MCG/ML injection Inject 1 ml weekly for 4 weeks then 1 ml every 4 weeks. 10 mL 1   docusate sodium (COLACE) 100 MG  capsule Take 1 capsule (100 mg total) by mouth 2 (two) times daily. 60 capsule 2   fluticasone (FLONASE) 50 MCG/ACT nasal spray Place 2 sprays into both nostrils daily. 16 g 6   HYDROcodone-acetaminophen (NORCO/VICODIN) 5-325 MG tablet Take 1 tablet by mouth every 4 (four) hours as needed for moderate pain (pain score 4-6). 40 tablet 0   hydroxychloroquine (PLAQUENIL) 200 MG tablet Take 200 mg by mouth 2 (two) times daily.     levothyroxine (SYNTHROID) 125 MCG tablet TAKE 1 TABLET(125 MCG) BY MOUTH DAILY BEFORE BREAKFAST (Patient taking differently: Take 125 mcg by mouth daily before breakfast.) 90 tablet 0   lisinopril-hydrochlorothiazide (ZESTORETIC) 10-12.5 MG tablet TAKE 1 TABLET BY MOUTH EVERY DAY 90 tablet 1   loratadine  (CLARITIN) 10 MG tablet Take 1 tablet (10 mg total) by mouth daily as needed for allergies. 90 tablet 1   methocarbamol (ROBAXIN-750) 750 MG tablet Take 1 tablet (750 mg total) by mouth 4 (four) times daily. 120 tablet 1   montelukast (SINGULAIR) 10 MG tablet TAKE 1 TABLET(10 MG) BY MOUTH AT BEDTIME (Patient taking differently: Take 10 mg by mouth at bedtime.) 90 tablet 3   polyethylene glycol powder (MIRALAX) 17 GM/SCOOP powder Take 8.5-34 g by mouth daily. To correct constipation.  Adjust dose over 1-2 months.  Goal = ~1 bowel movement / day     rosuvastatin (CRESTOR) 10 MG tablet TAKE 1 TABLET(10 MG) BY MOUTH DAILY (Patient taking differently: Take 10 mg by mouth daily.) 90 tablet 0   Semaglutide, 2 MG/DOSE, (OZEMPIC, 2 MG/DOSE,) 8 MG/3ML SOPN INJECT 2 MG SUBCUTANEOUS ONCE WEEKLY (Patient taking differently: Inject 2 mg into the skin once a week. Sundays. Last injection 04/04/2023) 9 mL 0   Vitamin D, Ergocalciferol, (DRISDOL) 1.25 MG (50000 UNIT) CAPS capsule TAKE 1 CAPSULE BY MOUTH EVERY 7 DAYS (Patient taking differently: Take 50,000 Units by mouth every 7 (seven) days. Tuesdays last dose 04/20/2023) 12 capsule 1   No current facility-administered medications on file prior to visit.   Past Medical History:  Diagnosis Date   Anemia    Arthritis    Cancer (HCC)    UTERINE   Diabetes mellitus    DM2, not requiring meds as of 04/2011   Dizziness    GERD (gastroesophageal reflux disease)    Hemorrhoids    Hyperlipidemia    Hypertension    Hypothyroidism    Lichen sclerosus    PONV (postoperative nausea and vomiting)    Primary osteoarthritis    Sleep apnea    USES CPAP   Swelling of both ankles    Type 2 diabetes mellitus (HCC)    Past Surgical History:  Procedure Laterality Date   ABDOMINAL HYSTERECTOMY     BARIATRIC SURGERY     BREAST LUMPECTOMY Left    BREAST SURGERY  1979   ELBOW SURGERY  1990'S   GASTRIC BYPASS  2007   INCISION AND DRAINAGE PERIRECTAL ABSCESS N/A  03/09/2022   Procedure: IRRIGATION AND DEBRIDEMENT PERIRECTAL ABSCESS;  Surgeon: Emelia Loron, MD;  Location: [facility];  Service: General;  Laterality: N/A;   INCISION AND DRAINAGE PERIRECTAL ABSCESS N/A 04/22/2023   Procedure: IRRIGATION AND DEBRIDEMENT PERIRECTAL ABSCESS;  Surgeon: Diamantina Monks, MD;  Location: [facility];  Service: General;  Laterality: N/A;   JOINT REPLACEMENT  2002   RT. KNEE   KNEE ARTHROSCOPY  1993   LT. KNEE   KNEE ARTHROSCOPY W/ LATERAL RELEASE  1987   RT. KNEE   left  hip replacement  05/2017   SHOULDER ARTHROSCOPY Right 05/22/2022   SHOULDER SURGERY Right 01/20/2022   rotator cuff repair   TONSILLECTOMY     TOTAL KNEE ARTHROPLASTY  07/06/2011   Procedure: TOTAL KNEE ARTHROPLASTY;  Surgeon: Raymon Mutton, MD;  Location: [facility];  Service: Orthopedics;  Laterality: Left;    Family History  Problem Relation Age of Onset   Hypertension Mother    Thyroid disease Mother    Diabetes Mother    Other Father        Wegoners disease   Thyroid disease Sister    Diabetes Sister    Breast cancer Daughter    Breast cancer Maternal Aunt    Breast cancer Paternal Aunt    Graves' disease Brother    Social History   Socioeconomic History   Marital status: Married    Spouse name: Not on file   Number of children: Not on file   Years of education: Not on file   Highest education level: Associate degree: occupational, Scientist, product/process development, or vocational program  Occupational History   Occupation: retired    Comment: [employer]  Tobacco Use   Smoking status: Never   Smokeless tobacco: Never  Vaping Use   Vaping status: Never Used  Substance and Sexual Activity   Alcohol use: No   Drug use: No   Sexual activity: Yes    Partners: Male    Birth control/protection: Surgical    Comment: hysterectomy  Other Topics Concern   Not on file  Social History Narrative   Not on file   Social Determinants of Health   Financial Resource Strain: Low Risk  (05/05/2023)   Overall  Financial Resource Strain (CARDIA)    Difficulty of Paying Living Expenses: Not hard at all  Food Insecurity: No Food Insecurity (05/05/2023)   Hunger Vital Sign    Worried About Running Out of Food in the Last Year: Never true    Ran Out of Food in the Last Year: Never true  Transportation Needs: No Transportation Needs (05/05/2023)   PRAPARE - Administrator, Civil Service (Medical): No    Lack of Transportation (Non-Medical): No  Physical Activity: Inactive (05/05/2023)   Exercise Vital Sign    Days of Exercise per Week: 0 days    Minutes of Exercise per Session: 0 min  Stress: No Stress Concern Present (05/05/2023)   [employer] of Occupational Health - Occupational Stress Questionnaire    Feeling of Stress : Not at all  Social Connections: Moderately Integrated (05/05/2023)   Social Connection and Isolation Panel [NHANES]    Frequency of Communication with Friends and Family: Never    Frequency of Social Gatherings with Friends and Family: Twice a week    Attends Religious Services: More than 4 times per year    Active Member of [employer] or Organizations: Yes    Attends Engineer, structural: More than 4 times per year    Marital Status: Married    Objective:  BP 102/70   Pulse 68   Temp 98 F (36.7 C)   Resp 16   Ht 5\' 5"  (1.651 m)   Wt 246 lb 9.6 oz (111.9 kg)   SpO2 93%   BMI 41.04 kg/m      05/06/2023    7:27 AM 04/22/2023    3:32 PM 04/22/2023    3:00 PM  BP/Weight  Systolic BP 102 112 106  Diastolic BP 70 61 56  Wt. (Lbs) 246.6  BMI 41.04 kg/m2      Physical Exam Vitals reviewed.  Constitutional:      Appearance: Normal appearance. She is obese.  Neck:     Vascular: No carotid bruit.  Cardiovascular:     Rate and Rhythm: Normal rate and regular rhythm.     Heart sounds: Normal heart sounds.  Pulmonary:     Effort: Pulmonary effort is normal. No respiratory distress.     Breath sounds: Normal breath sounds.  Abdominal:      General: Abdomen is flat. Bowel sounds are normal.     Palpations: Abdomen is soft.     Tenderness: There is no abdominal tenderness.  Neurological:     Mental Status: She is alert and oriented to person, place, and time.  Psychiatric:        Mood and Affect: Mood normal.        Behavior: Behavior normal.   trop  Diabetic Foot Exam - Simple   Simple Foot Form Diabetic Foot exam was performed with the following findings: Yes 05/06/2023  7:57 AM  Visual Inspection See comments: Yes Sensation Testing Intact to touch and monofilament testing bilaterally: Yes Pulse Check Posterior Tibialis and Dorsalis pulse intact bilaterally: Yes Comments Pes planus BL.       Lab Results  Component Value Date   WBC 13.6 (H) 04/21/2023   HGB 14.3 04/21/2023   HCT 42.0 04/21/2023   PLT 406 (H) 04/21/2023   GLUCOSE 152 (H) 04/21/2023   CHOL 120 01/15/2023   TRIG 115 01/15/2023   HDL 47 01/15/2023   LDLCALC 52 01/15/2023   ALT 32 04/21/2023   AST 38 04/21/2023   NA 134 (L) 04/21/2023   K 3.6 04/21/2023   CL 95 (L) 04/21/2023   CREATININE 0.90 04/21/2023   BUN 18 04/21/2023   CO2 25 04/21/2023   TSH 2.530 03/02/2023   INR 0.92 06/25/2011   HGBA1C 6.7 (H) 01/15/2023   MICROALBUR 30 07/24/2020      Assessment & Plan:    Hypertension associated with diabetes (HCC) Assessment & Plan: Diabetes and hypertension well controlled.  No changes to medicines. Lisinopril-hydrochlorothiazide 10-12.5 daily and ozempic 2 mg weekly.  Continue to work on eating a healthy diet and exercise.  Labs drawn today.    Orders: -     CBC with Differential/Platelet -     Comprehensive metabolic panel -     Hemoglobin A1c  Other specified hypothyroidism Assessment & Plan: Previously well controlled Continue Synthroid at current dose    Rheumatoid arthritis involving multiple sites with positive rheumatoid factor (HCC) Assessment & Plan: Management per specialist.     Mixed  hyperlipidemia Assessment & Plan: Well controlled.  No changes to medicines. Continue rosuvastatin 10 mg before bed.  Continue to work on eating a healthy diet and exercise.  Labs drawn today.    Orders: -     Lipid panel  Perirectal abscess Assessment & Plan: resolving   Encounter for immunization -     Influenza, MDCK, trivalent, PF(Flucelvax egg-free) -     Varicella-zoster vaccine IM     No orders of the defined types were placed in this encounter.   Orders Placed This Encounter  Procedures   Influenza, MDCK, trivalent, PF(Flucelvax egg-free)   Zoster Recombinant (Shingrix )   CBC with Differential/Platelet   Comprehensive metabolic panel   Hemoglobin A1c   Lipid panel     Follow-up: Return in about 3 months (around 08/06/2023) for chronic follow up.  I,Marla I Leal-Borjas,acting as a scribe for Blane Ohara, MD.,have documented all relevant documentation on the behalf of Blane Ohara, MD,as directed by  Blane Ohara, MD while in the presence of Blane Ohara, MD.   An After Visit Summary was printed and given to the patient.  Blane Ohara, MD [facility] [phone]

## 2023-05-06 NOTE — Assessment & Plan Note (Addendum)
Previously well controlled Continue Synthroid at current dose  

## 2023-05-07 LAB — LIPID PANEL
Chol/HDL Ratio: 2.7 ratio (ref 0.0–4.4)
Cholesterol, Total: 145 mg/dL (ref 100–199)
HDL: 54 mg/dL (ref >39)
LDL Chol Calc (NIH): 67 mg/dL (ref 0–99)
Triglycerides: 142 mg/dL (ref 0–149)
VLDL Cholesterol Cal: 24 mg/dL (ref 5–40)

## 2023-05-07 LAB — CBC WITH DIFFERENTIAL/PLATELET
Basophils Absolute: 0.1 10*3/uL (ref 0.0–0.2)
Basos: 1 %
EOS (ABSOLUTE): 0.1 10*3/uL (ref 0.0–0.4)
Eos: 2 %
Hematocrit: 38.2 % (ref 34.0–46.6)
Hemoglobin: 12.4 g/dL (ref 11.1–15.9)
Immature Grans (Abs): 0 10*3/uL (ref 0.0–0.1)
Immature Granulocytes: 0 %
Lymphocytes Absolute: 1.4 10*3/uL (ref 0.7–3.1)
Lymphs: 18 %
MCH: 28 pg (ref 26.6–33.0)
MCHC: 32.5 g/dL (ref 31.5–35.7)
MCV: 86 fL (ref 79–97)
Monocytes Absolute: 0.5 10*3/uL (ref 0.1–0.9)
Monocytes: 7 %
Neutrophils Absolute: 5.4 10*3/uL (ref 1.4–7.0)
Neutrophils: 72 %
Platelets: 323 10*3/uL (ref 150–450)
RBC: 4.43 x10E6/uL (ref 3.77–5.28)
RDW: 12.7 % (ref 11.7–15.4)
WBC: 7.5 10*3/uL (ref 3.4–10.8)

## 2023-05-07 LAB — COMPREHENSIVE METABOLIC PANEL
ALT: 10 [IU]/L (ref 0–32)
AST: 11 [IU]/L (ref 0–40)
Albumin: 4.2 g/dL (ref 3.9–4.9)
Alkaline Phosphatase: 94 [IU]/L (ref 44–121)
BUN/Creatinine Ratio: 21 (ref 12–28)
BUN: 15 mg/dL (ref 8–27)
Bilirubin Total: 0.3 mg/dL (ref 0.0–1.2)
CO2: 24 mmol/L (ref 20–29)
Calcium: 9.3 mg/dL (ref 8.7–10.3)
Chloride: 103 mmol/L (ref 96–106)
Creatinine, Ser: 0.7 mg/dL (ref 0.57–1.00)
Globulin, Total: 2.2 g/dL (ref 1.5–4.5)
Glucose: 114 mg/dL — ABNORMAL HIGH (ref 70–99)
Potassium: 4.8 mmol/L (ref 3.5–5.2)
Sodium: 144 mmol/L (ref 134–144)
Total Protein: 6.4 g/dL (ref 6.0–8.5)
eGFR: 97 mL/min/{1.73_m2} (ref >59)

## 2023-05-07 LAB — HEMOGLOBIN A1C
Est. average glucose Bld gHb Est-mCnc: 146 mg/dL
Hgb A1c MFr Bld: 6.7 % — ABNORMAL HIGH (ref 4.8–5.6)

## 2023-05-12 IMAGING — MG MM DIGITAL SCREENING BILAT W/ TOMO AND CAD
8 series · 8 of 24 positions shown · non-contrast
Comparison: Previous exam(s).

CLINICAL DATA: Screening.

EXAM:
DIGITAL SCREENING BILATERAL MAMMOGRAM WITH TOMOSYNTHESIS AND CAD
TECHNIQUE: Bilateral screening digital craniocaudal and mediolateral oblique
mammograms were obtained. Bilateral screening digital breast
tomosynthesis was performed. The images were evaluated with
computer-aided detection.

[R MLO synth-2D]
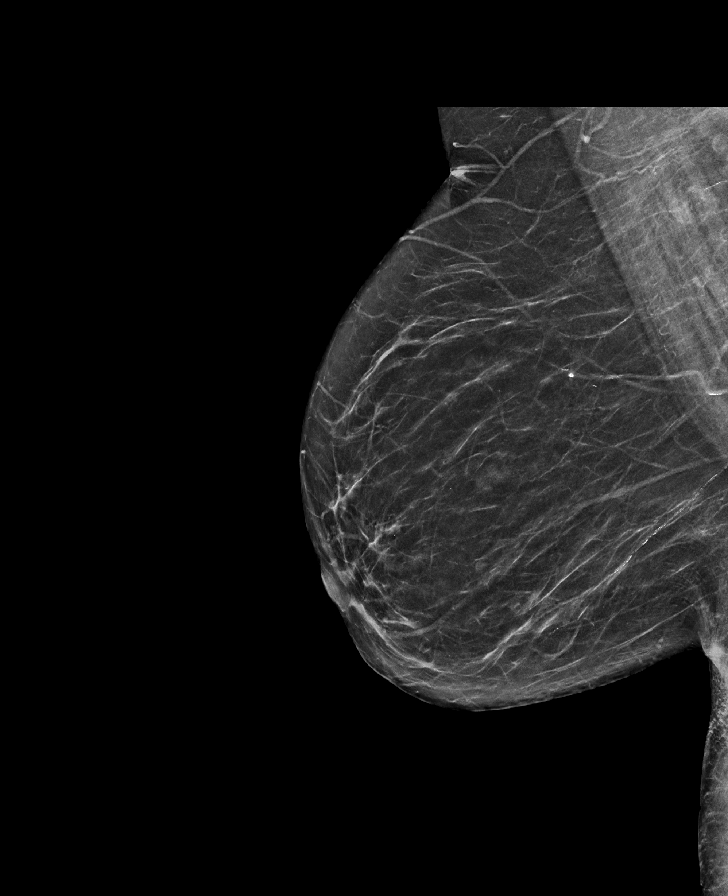

[R CC synth-2D]
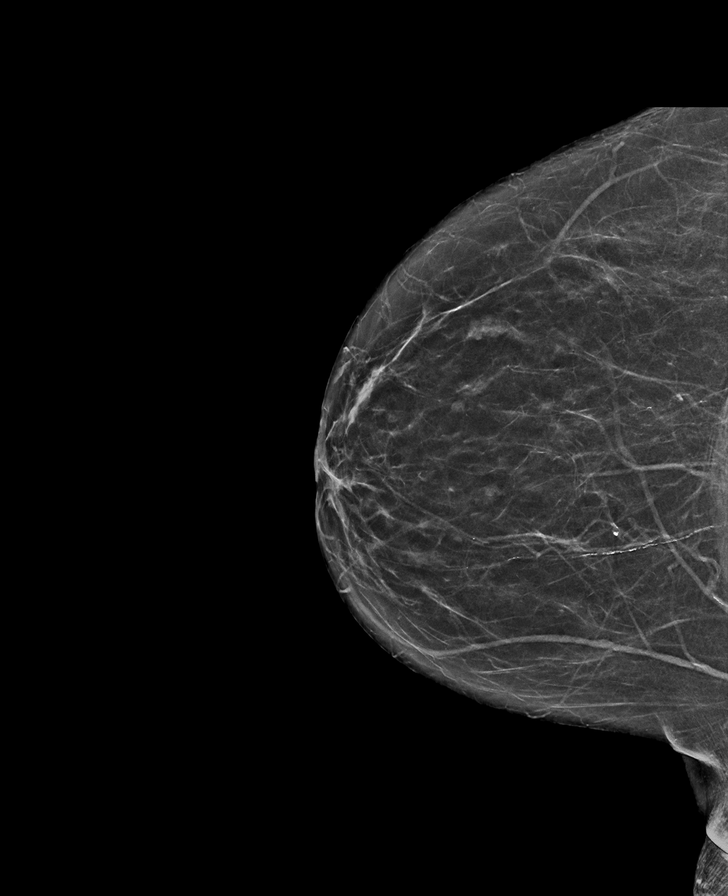

[L MLO synth-2D]
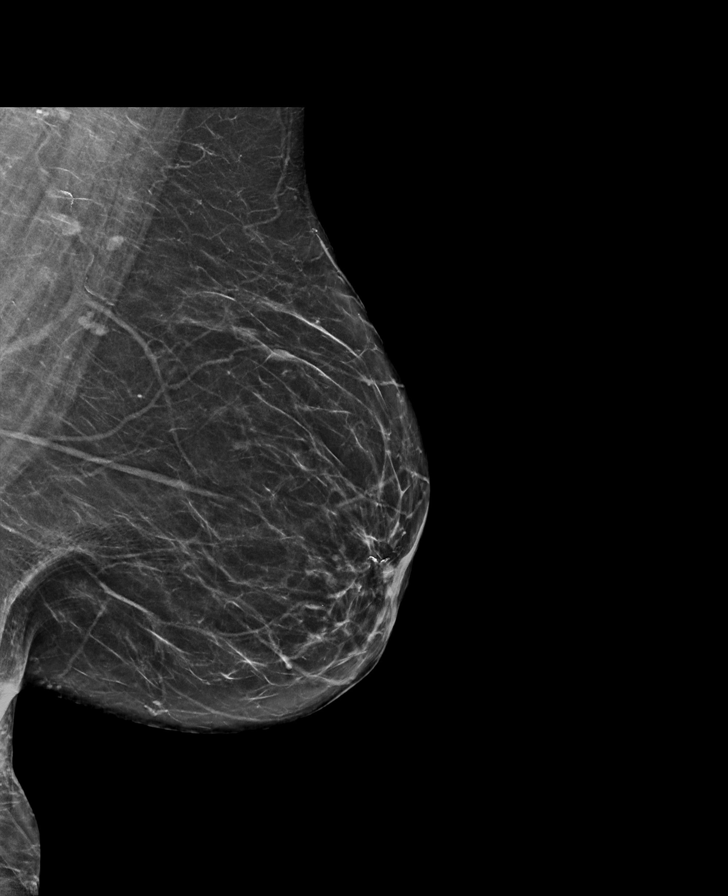

[L CC synth-2D]
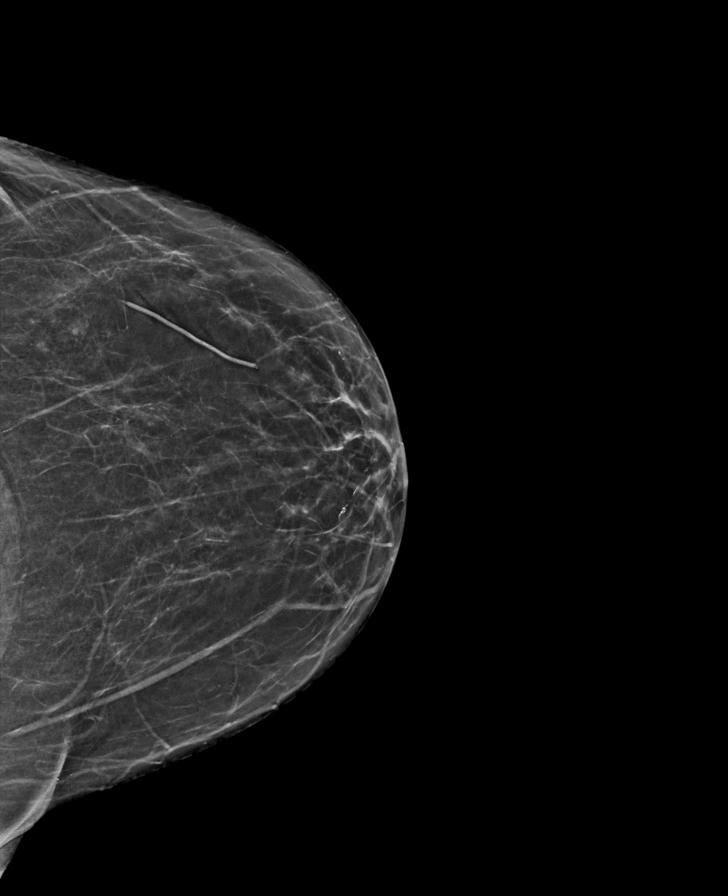

[R MLO tomo · tomo slice 31/61.0]
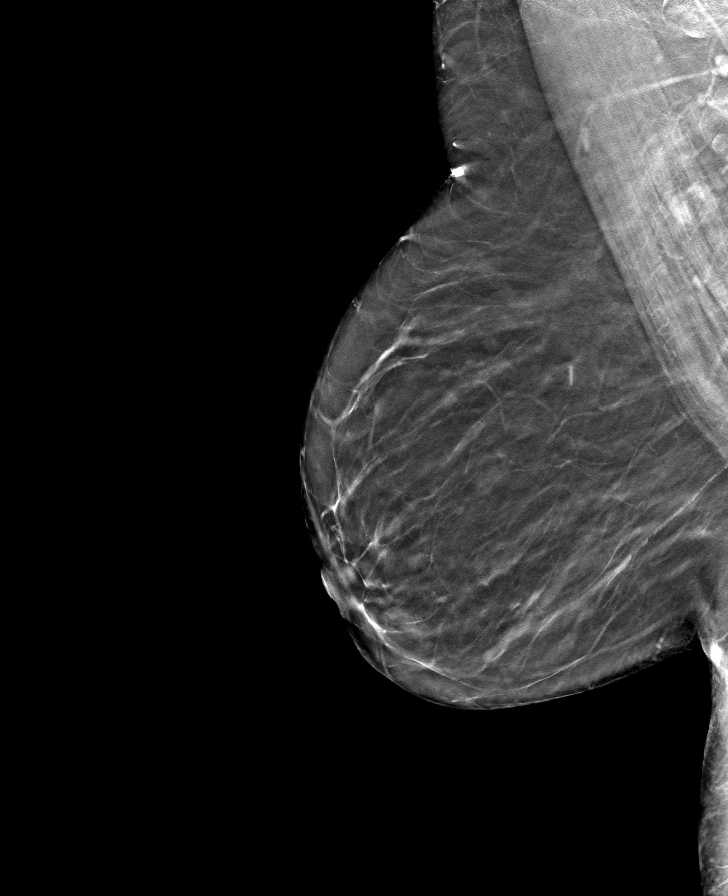

[L CC tomo · tomo slice 27/53.0]
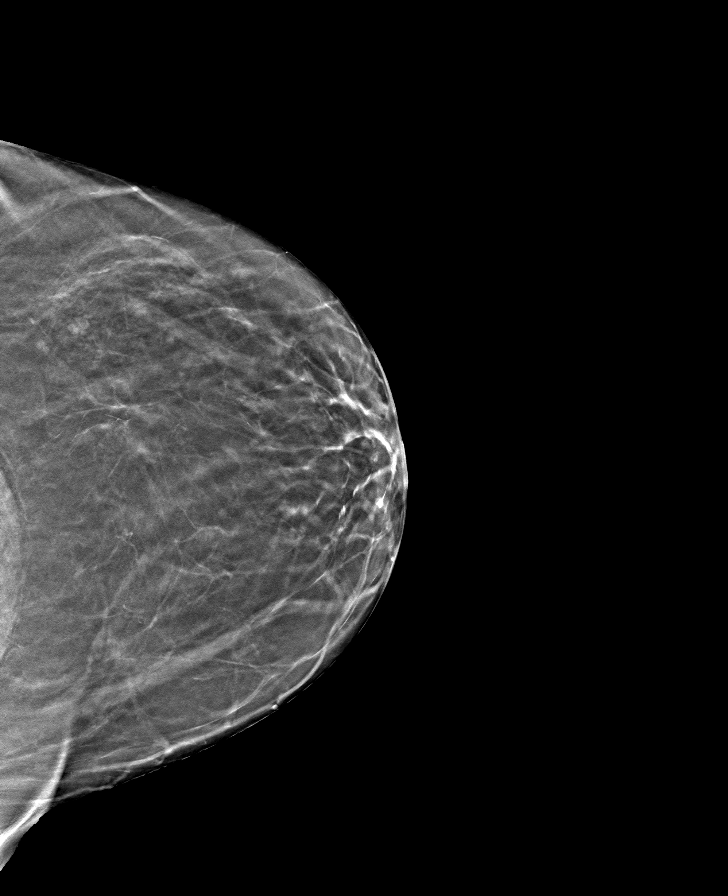

[R CC tomo · tomo slice 27/54.0]
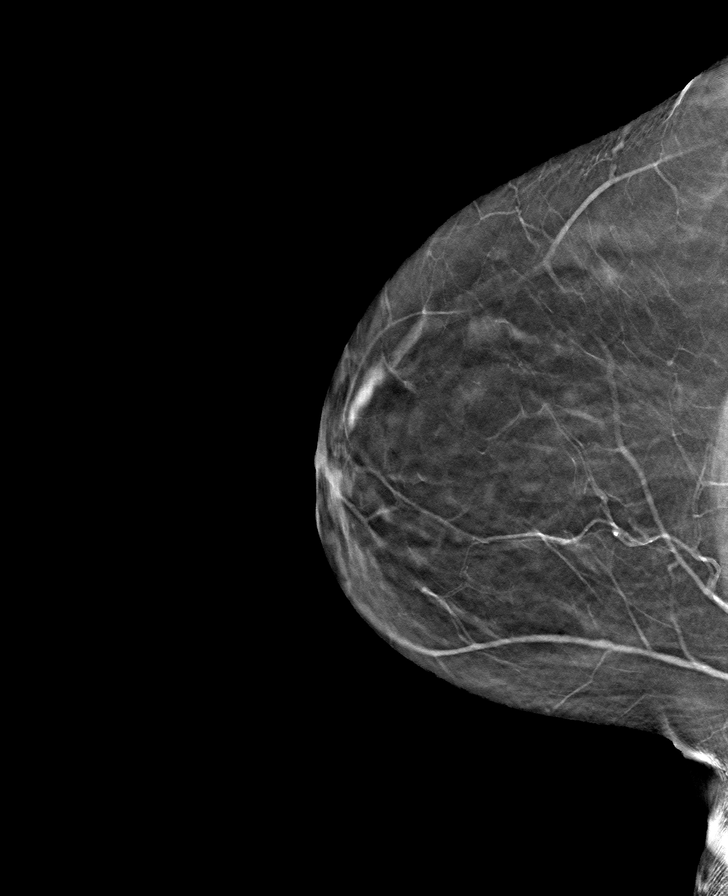

[L MLO tomo · tomo slice 33/66.0]
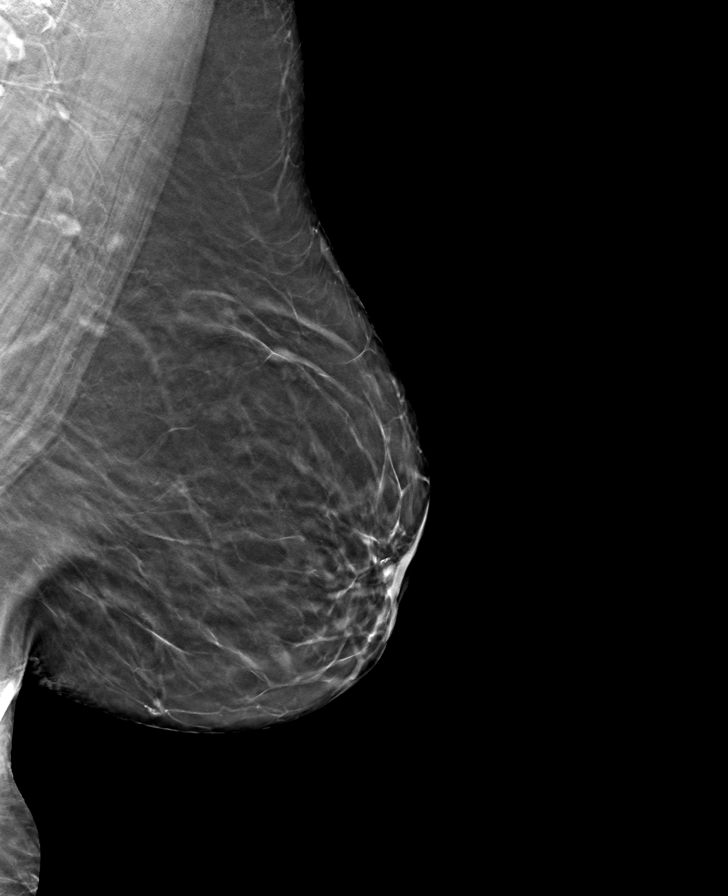

[8 of 24 positions shown; findings below may reference images not displayed]

ACR Breast Density Category b: There are scattered areas of
fibroglandular density.
FINDINGS: There are no findings suspicious for malignancy.
IMPRESSION: No mammographic evidence of malignancy. A result letter of this
screening mammogram will be mailed directly to the patient.

RECOMMENDATION:
Screening mammogram in one year. (Code:51-O-LD2)

BI-RADS CATEGORY  1: Negative.

## 2023-06-01 ENCOUNTER — Other Ambulatory Visit: Payer: Self-pay | Admitting: Family Medicine

## 2023-06-01 DIAGNOSIS — Z1231 Encounter for screening mammogram for malignant neoplasm of breast: Secondary | ICD-10-CM

## 2023-06-02 ENCOUNTER — Inpatient Hospital Stay
Admission: RE | Admit: 2023-06-02 | Discharge: 2023-06-02 | Discharge status: Home / Self Care | Payer: BC Managed Care – PPO | Source: Ambulatory Visit | Attending: Family Medicine | Admitting: Family Medicine

## 2023-06-02 DIAGNOSIS — Z1231 Encounter for screening mammogram for malignant neoplasm of breast: Secondary | ICD-10-CM

## 2023-06-11 ENCOUNTER — Other Ambulatory Visit: Payer: Self-pay | Admitting: Family Medicine

## 2023-06-11 DIAGNOSIS — J018 Other acute sinusitis: Secondary | ICD-10-CM

## 2023-06-11 MED ORDER — MONTELUKAST SODIUM 10 MG PO TABS
10.0000 mg | ORAL_TABLET | Freq: Every day | ORAL | 3 refills | Status: DC
  Filled 2023-11-23: qty 90, 90d supply, fill #0
  Filled 2024-03-12 – 2024-03-20 (×2): qty 90, 90d supply, fill #1

## 2023-06-25 ENCOUNTER — Other Ambulatory Visit: Payer: Self-pay | Admitting: Family Medicine

## 2023-06-25 DIAGNOSIS — E039 Hypothyroidism, unspecified: Secondary | ICD-10-CM

## 2023-06-29 ENCOUNTER — Other Ambulatory Visit: Payer: Self-pay | Admitting: Family Medicine

## 2023-06-29 DIAGNOSIS — E1165 Type 2 diabetes mellitus with hyperglycemia: Secondary | ICD-10-CM

## 2023-07-03 ENCOUNTER — Other Ambulatory Visit: Payer: Self-pay | Admitting: Family Medicine

## 2023-07-03 DIAGNOSIS — E782 Mixed hyperlipidemia: Secondary | ICD-10-CM

## 2023-09-01 NOTE — Progress Notes (Unsigned)
 Subjective:  Patient ID: Kathleen Russo, female    DOB: 13-May-1961  Age: 63 y.o. MRN: [id number]  Chief Complaint  Patient presents with   Medical Management of Chronic Issues   Discussed the use of AI scribe software for clinical note transcription with the patient, who gave verbal consent to proceed.  History of Present Illness   The patient, with a history of diabetes, hypothyroidism, hypertension, vitamin D deficiency, rheumatoid arthritis, and fibromyalgia, presents with worsening allergies. She is currently taking Claritin and Flonase, but she feels that the Claritin is not as effective as it used to be. She is considering trying a different antihistamine, such as Zyrtec or Allegra.  Her blood sugars have been around 120-125 in the mornings, but she admits to a poor diet. She has been trying to improve her exercise habits, walking a little further each day since February. She is checking her feet regularly.  She is due for an eye exam, as her last one was sometime last year. She is also out of an ointment that was prescribed by her gynecologist for a lichen spot.  She has been experiencing pain in her hands, which she attributes to her rheumatoid arthritis. She was previously on methotrexate, but stopped taking it for reasons she can't recall. She is considering going back on it, but is open to trying other medications.  She also has a prescription for Robaxin for hip tendonitis, and occasionally takes Miralax for constipation. She takes a B12 injection once a month and calcium with vitamin D once a day, which she notes has strengthened her nails.       History of Present Illness   The patient, with a history of diabetes, hypothyroidism, hypertension, vitamin D deficiency, rheumatoid arthritis, and b12 deficiency, presents with worsening allergies. She is currently taking Claritin, singulair and Flonase, but she feels that these medicines are not as effective as it used to be. She  is considering trying a different antihistamine, such as Zyrtec or Allegra.  Her blood sugars have been around 120-125 in the mornings, but she admits to a poor diet. She has been trying to improve her exercise habits, walking a little further each day since February. She is checking her feet regularly. She is due for an eye exam, as her last one was sometime last year.   She is also out of an ointment that was prescribed by her gynecologist for a lichen spot.  She has been experiencing pain in her hands, which she attributes to her rheumatoid arthritis. She was previously on methotrexate, but stopped taking it for reasons she can't recall. She is considering going back on it, but is open to trying other medications.  She also has a prescription for Robaxin for hip pain, and occasionally takes Miralax and colace for constipation. She takes a B12 injection once a month, Vitamin D once weekly, and calcium with vitamin D once a day, which she notes has strengthened her nails. She is requesting tessalon perles which she takes at night.    Diabetes:  Complications:hypoglycemia. Glucose checking:  three to four times weekly  Glucose logs: 120s. Poor diet. Exercising since February. Walking daily.  Hypoglycemia: none Most recent A1C: 6.7% Current medications: ozempic 2 mg weekly  Last Eye Exam: few months ago.  Foot checks: daily    Hyperlipidemia: Current medications: Rosuvastatin 10 mg daily    Hypertension: Current medications: Lisinopril-hydrochlorothiazide 10-12.5 daily   Hypothyroidism: on synthroid 112 mcg once daily in am.  RA: [facility] rheumatology following. ON plaquenil 200 mg 2 daily. Came off METHOTREXATE because it made her feel "bad." Joints are hurting. Sees Azucena Fallen, NP.     Vitamin D deficiency: taking vitamin D once weekly.   Vitamin B12 deficiency: Monthly injections.    Diet: healthy Exercise: walking.        09/02/2023    7:33 AM 05/06/2023    7:34 AM  01/15/2023    7:29 AM 09/04/2022    7:57 AM 07/28/2021   11:22 AM  Depression screen PHQ 2/9  Decreased Interest 0  0 0 0  Down, Depressed, Hopeless 0  0 0 0  PHQ - 2 Score 0  0 0 0  Altered sleeping  0 0    Tired, decreased energy  0 0    Change in appetite  0 0    Feeling bad or failure about yourself   0 0    Trouble concentrating  0 0    Moving slowly or fidgety/restless  0 0    Suicidal thoughts  0 0    PHQ-9 Score   0    Difficult doing work/chores  Not difficult at all Not difficult at all          09/02/2023    7:33 AM  Fall Risk   Falls in the past year? 0  Number falls in past yr: 0  Injury with Fall? 0  Risk for fall due to : No Fall Risks    Patient Care Team: Blane Ohara, MD as PCP - General (Family Medicine)   Review of Systems  Constitutional:  Negative for chills, fatigue and fever.  HENT:  Negative for congestion, ear pain, sinus pressure and sore throat.   Respiratory:  Negative for cough.   Cardiovascular:  Negative for chest pain.  Gastrointestinal:  Negative for abdominal pain, constipation, diarrhea, nausea and vomiting.  Genitourinary:  Negative for dysuria and frequency.  Musculoskeletal:  Positive for arthralgias. Negative for back pain and myalgias.  Neurological:  Negative for dizziness and headaches.  Psychiatric/Behavioral:  Negative for dysphoric mood. The patient is not nervous/anxious.     Current Outpatient Medications on File Prior to Visit  Medication Sig Dispense Refill   acetaminophen (TYLENOL) 500 MG tablet Take 2 tablets (1,000 mg total) by mouth 4 (four) times daily. 120 tablet 3   calcium carbonate (OSCAL) 1500 (600 Ca) MG TABS tablet Take 600 mg of elemental calcium by mouth daily.     cyanocobalamin (VITAMIN B12) 1000 MCG/ML injection Inject 1 ml weekly for 4 weeks then 1 ml every 4 weeks. 10 mL 1   fluticasone (FLONASE) 50 MCG/ACT nasal spray Place 2 sprays into both nostrils daily. 16 g 6   hydroxychloroquine (PLAQUENIL) 200 MG  tablet Take 200 mg by mouth 2 (two) times daily.     levothyroxine (SYNTHROID) 125 MCG tablet TAKE 1 TABLET(125 MCG) BY MOUTH DAILY BEFORE BREAKFAST 90 tablet 1   lisinopril-hydrochlorothiazide (ZESTORETIC) 10-12.5 MG tablet TAKE 1 TABLET BY MOUTH EVERY DAY 90 tablet 1   methocarbamol (ROBAXIN-750) 750 MG tablet Take 1 tablet (750 mg total) by mouth 4 (four) times daily. 120 tablet 1   montelukast (SINGULAIR) 10 MG tablet Take 1 tablet (10 mg total) by mouth at bedtime. 90 tablet 3   OZEMPIC, 2 MG/DOSE, 8 MG/3ML SOPN INJECT 2 MG SUBCUTANEOUS ONCE WEEKLY 9 mL 0   polyethylene glycol powder (MIRALAX) 17 GM/SCOOP powder Take 8.5-34 g by mouth daily. To correct constipation.  Adjust dose over 1-2 months.  Goal = ~1 bowel movement / day     rosuvastatin (CRESTOR) 10 MG tablet Take 1 tablet (10 mg total) by mouth daily. 90 tablet 0   Vitamin D, Ergocalciferol, (DRISDOL) 1.25 MG (50000 UNIT) CAPS capsule TAKE 1 CAPSULE BY MOUTH EVERY 7 DAYS (Patient taking differently: Take 50,000 Units by mouth every 7 (seven) days. Tuesdays last dose 04/20/2023) 12 capsule 1   No current facility-administered medications on file prior to visit.   Past Medical History:  Diagnosis Date   Anemia    Arthritis    Cancer (HCC)    UTERINE   Diabetes mellitus    DM2, not requiring meds as of 04/2011   Dizziness    GERD (gastroesophageal reflux disease)    Hemorrhoids    Hyperlipidemia    Hypertension    Hypothyroidism    Lichen sclerosus    PONV (postoperative nausea and vomiting)    Primary osteoarthritis    Sleep apnea    USES CPAP   Swelling of both ankles    Type 2 diabetes mellitus (HCC)    Past Surgical History:  Procedure Laterality Date   ABDOMINAL HYSTERECTOMY     BARIATRIC SURGERY     BREAST LUMPECTOMY Left    BREAST SURGERY  1979   ELBOW SURGERY  1990'S   GASTRIC BYPASS  2007   INCISION AND DRAINAGE PERIRECTAL ABSCESS N/A 03/09/2022   Procedure: IRRIGATION AND DEBRIDEMENT PERIRECTAL ABSCESS;   Surgeon: Emelia Loron, MD;  Location: [facility];  Service: General;  Laterality: N/A;   INCISION AND DRAINAGE PERIRECTAL ABSCESS N/A 04/22/2023   Procedure: IRRIGATION AND DEBRIDEMENT PERIRECTAL ABSCESS;  Surgeon: Diamantina Monks, MD;  Location: [facility] OR;  Service: General;  Laterality: N/A;   JOINT REPLACEMENT  2002   RT. KNEE   KNEE ARTHROSCOPY  1993   LT. KNEE   KNEE ARTHROSCOPY W/ LATERAL RELEASE  1987   RT. KNEE   left hip replacement  05/2017   SHOULDER ARTHROSCOPY Right 05/22/2022   SHOULDER SURGERY Right 01/20/2022   rotator cuff repair   TONSILLECTOMY     TOTAL KNEE ARTHROPLASTY  07/06/2011   Procedure: TOTAL KNEE ARTHROPLASTY;  Surgeon: Raymon Mutton, MD;  Location: [facility];  Service: Orthopedics;  Laterality: Left;    Family History  Problem Relation Age of Onset   Hypertension Mother    Thyroid disease Mother    Diabetes Mother    Other Father        Wegoners disease   Thyroid disease Sister    Diabetes Sister    Breast cancer Daughter    Breast cancer Maternal Aunt    Breast cancer Paternal Aunt    Graves' disease Brother    Social History   Socioeconomic History   Marital status: Married    Spouse name: Not on file   Number of children: Not on file   Years of education: Not on file   Highest education level: Associate degree: occupational, Scientist, product/process development, or vocational program  Occupational History   Occupation: retired    Comment: [employer]  Tobacco Use   Smoking status: Never   Smokeless tobacco: Never  Vaping Use   Vaping status: Never Used  Substance and Sexual Activity   Alcohol use: No   Drug use: No   Sexual activity: Yes    Partners: Male    Birth control/protection: Surgical    Comment: hysterectomy  Other Topics Concern   Not on file  Social  History Narrative   Not on file   Social Drivers of Health   Financial Resource Strain: Low Risk  (05/05/2023)   Overall Financial Resource Strain (CARDIA)    Difficulty of Paying Living Expenses: Not  hard at all  Food Insecurity: No Food Insecurity (05/05/2023)   Hunger Vital Sign    Worried About Running Out of Food in the Last Year: Never true    Ran Out of Food in the Last Year: Never true  Transportation Needs: No Transportation Needs (05/05/2023)   PRAPARE - Administrator, Civil Service (Medical): No    Lack of Transportation (Non-Medical): No  Physical Activity: Inactive (05/05/2023)   Exercise Vital Sign    Days of Exercise per Week: 0 days    Minutes of Exercise per Session: 0 min  Stress: No Stress Concern Present (05/05/2023)   [employer] of Occupational Health - Occupational Stress Questionnaire    Feeling of Stress : Not at all  Social Connections: Moderately Integrated (05/05/2023)   Social Connection and Isolation Panel [NHANES]    Frequency of Communication with Friends and Family: Never    Frequency of Social Gatherings with Friends and Family: Twice a week    Attends Religious Services: More than 4 times per year    Active Member of [employer] or Organizations: Yes    Attends Engineer, structural: More than 4 times per year    Marital Status: Married    Objective:  BP 110/66   Pulse 67   Temp 97.8 F (36.6 C)   Ht 5\' 5"  (1.651 m)   Wt 255 lb (115.7 kg)   SpO2 97%   BMI 42.43 kg/m      09/02/2023    7:30 AM 05/06/2023    7:27 AM 04/22/2023    3:32 PM  BP/Weight  Systolic BP 110 102 112  Diastolic BP 66 70 61  Wt. (Lbs) 255 246.6   BMI 42.43 kg/m2 41.04 kg/m2     Physical Exam Vitals reviewed.  Constitutional:      Appearance: Normal appearance. She is obese.  Neck:     Vascular: No carotid bruit.  Cardiovascular:     Rate and Rhythm: Normal rate and regular rhythm.     Heart sounds: Normal heart sounds.  Pulmonary:     Effort: Pulmonary effort is normal. No respiratory distress.     Breath sounds: Normal breath sounds.  Abdominal:     General: Abdomen is flat. Bowel sounds are normal.     Palpations: Abdomen is  soft.     Tenderness: There is no abdominal tenderness.  Neurological:     Mental Status: She is alert and oriented to person, place, and time.  Psychiatric:        Mood and Affect: Mood normal.        Behavior: Behavior normal.     Diabetic Foot Exam - Simple   Simple Foot Form Diabetic Foot exam was performed with the following findings: Yes 09/02/2023  7:55 AM  Visual Inspection See comments: Yes Sensation Testing See comments: Yes Pulse Check Posterior Tibialis and Dorsalis pulse intact bilaterally: Yes Comments Flat feet BL.  Numbness of right lateral foot      Lab Results  Component Value Date   WBC 7.5 05/06/2023   HGB 12.4 05/06/2023   HCT 38.2 05/06/2023   PLT 323 05/06/2023   GLUCOSE 114 (H) 05/06/2023   CHOL 145 05/06/2023   TRIG 142 05/06/2023   HDL  54 05/06/2023   LDLCALC 67 05/06/2023   ALT 10 05/06/2023   AST 11 05/06/2023   NA 144 05/06/2023   K 4.8 05/06/2023   CL 103 05/06/2023   CREATININE 0.70 05/06/2023   BUN 15 05/06/2023   CO2 24 05/06/2023   TSH 2.530 03/02/2023   INR 0.92 06/25/2011   HGBA1C 6.7 (H) 05/06/2023   MICROALBUR 30 07/24/2020      Assessment & Plan:  Assessment and Plan    Type 2 Diabetes Mellitus Morning blood glucose levels are 120-125 mg/dL. Non-adherent to strict diet but increased physical activity since February. On Ozempic. - Continue Ozempic. - Encourage continued physical activity and dietary improvements. - Schedule follow-up in 3 months.  Rheumatoid Arthritis Significant hand pain. On Plaquenil. Considering restarting methotrexate due to increased pain. Alternative medications to be discussed with rheumatologist. - Discuss alternative medications with rheumatologist. - Consider restarting methotrexate if appropriate.  Allergic Rhinitis Uses Claritin but may be ineffective. Also uses Flonase. Alternatives like Zyrtec or Allegra suggested. - Refill Claritin. - Consider Zyrtec or Allegra if Claritin is  ineffective.  Hypothyroidism On levothyroxine 125 mcg. TSH and free thyroxine within normal range. - Continue levothyroxine 125 mcg daily.  Hypertension On lisinopril and hydrochlorothiazide. - Continue lisinopril and hydrochlorothiazide.  Hyperlipidemia On rosuvastatin 10 mg daily. - Continue rosuvastatin 10 mg daily.  Vitamin D Deficiency On weekly vitamin D supplementation. - Continue weekly vitamin D supplementation.  Constipation Uses Miralax as needed and occasionally a stool softener. - Continue Miralax as needed. - Use stool softener as needed.  Lichen Sclerosus Uses ointment prescribed by gynecologist. - Refill ointment.             Hypertension associated with diabetes (HCC) -     Comprehensive metabolic panel -     CBC with Differential/Platelet -     Hemoglobin A1c -     Microalbumin / creatinine urine ratio  Other specified hypothyroidism  Rheumatoid arthritis involving multiple sites with positive rheumatoid factor (HCC)  Mixed hyperlipidemia -     Lipid panel  Non-seasonal allergic rhinitis due to pollen -     Loratadine; Take 1 tablet (10 mg total) by mouth daily as needed for allergies.  Dispense: 90 tablet; Refill: 3  Other orders -     Clobetasol Propionate; Apply 1 Application topically 2 (two) times daily.  Dispense: 30 g; Refill: 2     Meds ordered this encounter  Medications   loratadine (CLARITIN) 10 MG tablet    Sig: Take 1 tablet (10 mg total) by mouth daily as needed for allergies.    Dispense:  90 tablet    Refill:  3   clobetasol ointment (TEMOVATE) 0.05 %    Sig: Apply 1 Application topically 2 (two) times daily.    Dispense:  30 g    Refill:  2    Orders Placed This Encounter  Procedures   Comprehensive metabolic panel   CBC with Differential/Platelet   Hemoglobin A1c   Lipid panel   Microalbumin / creatinine urine ratio     Follow-up: Return in about 3 months (around 12/03/2023) for chronic  fasting.   I,Marla I Leal-Borjas,acting as a scribe for Blane Ohara, MD.,have documented all relevant documentation on the behalf of Blane Ohara, MD,as directed by  Blane Ohara, MD while in the presence of Blane Ohara, MD.   An After Visit Summary was printed and given to the patient. I attest that I have reviewed this visit and agree with  the plan scribed by my staff.   Blane Ohara, MD [facility] [phone]

## 2023-09-02 ENCOUNTER — Ambulatory Visit: Payer: BC Managed Care – PPO | Admitting: Family Medicine

## 2023-09-02 ENCOUNTER — Encounter: Payer: Self-pay | Admitting: Family Medicine

## 2023-09-02 VITALS — BP 110/66 | HR 67 | Temp 97.8°F | Ht 65.0 in | Wt 255.0 lb

## 2023-09-02 DIAGNOSIS — N904 Leukoplakia of vulva: Secondary | ICD-10-CM

## 2023-09-02 DIAGNOSIS — E038 Other specified hypothyroidism: Secondary | ICD-10-CM

## 2023-09-02 DIAGNOSIS — E538 Deficiency of other specified B group vitamins: Secondary | ICD-10-CM

## 2023-09-02 DIAGNOSIS — M0579 Rheumatoid arthritis with rheumatoid factor of multiple sites without organ or systems involvement: Secondary | ICD-10-CM | POA: Diagnosis not present

## 2023-09-02 DIAGNOSIS — E782 Mixed hyperlipidemia: Secondary | ICD-10-CM | POA: Diagnosis not present

## 2023-09-02 DIAGNOSIS — E559 Vitamin D deficiency, unspecified: Secondary | ICD-10-CM

## 2023-09-02 DIAGNOSIS — J301 Allergic rhinitis due to pollen: Secondary | ICD-10-CM

## 2023-09-02 DIAGNOSIS — E1159 Type 2 diabetes mellitus with other circulatory complications: Secondary | ICD-10-CM | POA: Diagnosis not present

## 2023-09-02 DIAGNOSIS — I152 Hypertension secondary to endocrine disorders: Secondary | ICD-10-CM

## 2023-09-02 DIAGNOSIS — E66813 Obesity, class 3: Secondary | ICD-10-CM

## 2023-09-02 DIAGNOSIS — Z6841 Body Mass Index (BMI) 40.0 and over, adult: Secondary | ICD-10-CM

## 2023-09-02 MED ORDER — BENZONATATE 200 MG PO CAPS: 200.0000 mg | ORAL_CAPSULE | Freq: Three times a day (TID) | ORAL | 5 refills | Status: DC | PRN

## 2023-09-02 MED ORDER — CLOBETASOL PROPIONATE 0.05 % EX OINT
1.0000 | TOPICAL_OINTMENT | Freq: Two times a day (BID) | CUTANEOUS | 2 refills | Status: AC
  Filled 2023-11-23: qty 30, 15d supply, fill #0
  Filled 2024-03-12 – 2024-05-11 (×2): qty 30, 15d supply, fill #1

## 2023-09-02 MED ORDER — LORATADINE 10 MG PO TABS
10.0000 mg | ORAL_TABLET | Freq: Every day | ORAL | 3 refills | Status: AC | PRN
  Filled 2023-11-23: qty 90, 90d supply, fill #0

## 2023-09-02 NOTE — Assessment & Plan Note (Signed)
Well controlled.  No changes to medicines. Continue rosuvastatin 10 mg before bed  Continue to work on eating a healthy diet and exercise.  Labs drawn today.   

## 2023-09-02 NOTE — Patient Instructions (Signed)
 VISIT SUMMARY:  During today's visit, we discussed your ongoing health concerns, including your diabetes, rheumatoid arthritis, allergies, hypothyroidism, hypertension, hyperlipidemia, vitamin D deficiency, constipation, and lichen sclerosus. We reviewed your current medications and made some recommendations for adjustments and follow-ups.  YOUR PLAN:  -TYPE 2 DIABETES MELLITUS: Type 2 diabetes is a condition where your body does not use insulin properly, leading to high blood sugar levels. Your morning blood sugar levels are between 120-125 mg/dL. Continue taking Ozempic, keep up with your increased physical activity, and work on improving your diet. We will schedule a follow-up in 3 months.  -RHEUMATOID ARTHRITIS: Rheumatoid arthritis is an autoimmune disease that causes joint pain and damage. You are experiencing significant hand pain and are currently on Plaquenil. We will discuss alternative medications with your rheumatologist and consider restarting methotrexate if appropriate.  -ALLERGIC RHINITIS: Allergic rhinitis is an allergic reaction that causes sneezing, congestion, and a runny nose. You are currently using Claritin and Flonase. If Claritin is not effective, consider trying Zyrtec or Allegra. We will refill your Claritin prescription.  -HYPOTHYROIDISM: Hypothyroidism is a condition where your thyroid gland does not produce enough thyroid hormone.  Continue taking levothyroxine 125 mcg daily.  -HYPERTENSION: Hypertension is high blood pressure. You are currently taking lisinopril and hydrochlorothiazide. Continue with these medications as prescribed.  -HYPERLIPIDEMIA: Hyperlipidemia is having high levels of lipids (fats) in your blood. You are taking rosuvastatin 10 mg daily. Continue with this medication as prescribed.  -VITAMIN D DEFICIENCY: Vitamin D deficiency means you have lower than normal levels of vitamin D. You are on weekly vitamin D supplementation. Continue with this  supplementation.  -CONSTIPATION: Constipation is having infrequent or hard-to-pass bowel movements. You use Miralax as needed and occasionally a stool softener. Continue using these as needed.  -LICHEN SCLEROSUS: Lichen sclerosus is a skin condition that causes thin, white patches of skin, often in the genital area. We will refill the ointment prescribed by your gynecologist.  INSTRUCTIONS:  Please schedule a follow-up appointment in 3 months to review your diabetes management. Additionally, make sure to discuss alternative medications for your rheumatoid arthritis with your rheumatologist. Don't forget to schedule your eye exam as you are due for one. Lastly, ensure you get a refill for the ointment prescribed by your gynecologist for lichen sclerosus.

## 2023-09-02 NOTE — Assessment & Plan Note (Signed)
 Management per specialist.  Discuss worsening joint pain.

## 2023-09-02 NOTE — Assessment & Plan Note (Addendum)
 Previously well controlled Continue Synthroid 125 mcg daily daily

## 2023-09-02 NOTE — Assessment & Plan Note (Signed)
 Uses Claritin but may be ineffective. Also uses Flonase. Alternatives like Zyrtec or Allegra suggested. - Refill Claritin. - Consider Zyrtec or Allegra if Claritin is ineffective.

## 2023-09-02 NOTE — Assessment & Plan Note (Addendum)
 Diabetes and hypertension well controlled.  Continue to check glucose daily.  Check feet daily.  No changes to medicines. Lisinopril-hydrochlorothiazide 10-12.5 daily and ozempic 2 mg weekly.  Continue to work on eating a healthy diet and exercise.  Labs drawn today.   Check feet daily. Go to eye doctor annually.

## 2023-09-03 LAB — CBC WITH DIFFERENTIAL/PLATELET
Basophils Absolute: 0 10*3/uL (ref 0.0–0.2)
Basos: 1 %
EOS (ABSOLUTE): 0.1 10*3/uL (ref 0.0–0.4)
Eos: 2 %
Hematocrit: 39 % (ref 34.0–46.6)
Hemoglobin: 12.5 g/dL (ref 11.1–15.9)
Immature Grans (Abs): 0 10*3/uL (ref 0.0–0.1)
Immature Granulocytes: 0 %
Lymphocytes Absolute: 1.2 10*3/uL (ref 0.7–3.1)
Lymphs: 20 %
MCH: 27.2 pg (ref 26.6–33.0)
MCHC: 32.1 g/dL (ref 31.5–35.7)
MCV: 85 fL (ref 79–97)
Monocytes Absolute: 0.4 10*3/uL (ref 0.1–0.9)
Monocytes: 7 %
Neutrophils Absolute: 4.2 10*3/uL (ref 1.4–7.0)
Neutrophils: 70 %
Platelets: 295 10*3/uL (ref 150–450)
RBC: 4.59 x10E6/uL (ref 3.77–5.28)
RDW: 13.1 % (ref 11.7–15.4)
WBC: 6 10*3/uL (ref 3.4–10.8)

## 2023-09-03 LAB — COMPREHENSIVE METABOLIC PANEL
ALT: 14 IU/L (ref 0–32)
AST: 13 IU/L (ref 0–40)
Albumin: 4.1 g/dL (ref 3.9–4.9)
Alkaline Phosphatase: 93 IU/L (ref 44–121)
BUN/Creatinine Ratio: 26 (ref 12–28)
BUN: 18 mg/dL (ref 8–27)
Bilirubin Total: 0.3 mg/dL (ref 0.0–1.2)
CO2: 25 mmol/L (ref 20–29)
Calcium: 9.1 mg/dL (ref 8.7–10.3)
Chloride: 101 mmol/L (ref 96–106)
Creatinine, Ser: 0.69 mg/dL (ref 0.57–1.00)
Globulin, Total: 1.9 g/dL (ref 1.5–4.5)
Glucose: 102 mg/dL — ABNORMAL HIGH (ref 70–99)
Potassium: 4.1 mmol/L (ref 3.5–5.2)
Sodium: 141 mmol/L (ref 134–144)
Total Protein: 6 g/dL (ref 6.0–8.5)
eGFR: 97 mL/min/{1.73_m2} (ref >59)

## 2023-09-03 LAB — LIPID PANEL
Chol/HDL Ratio: 2.5 ratio (ref 0.0–4.4)
Cholesterol, Total: 109 mg/dL (ref 100–199)
HDL: 44 mg/dL (ref >39)
LDL Chol Calc (NIH): 41 mg/dL (ref 0–99)
Triglycerides: 137 mg/dL (ref 0–149)
VLDL Cholesterol Cal: 24 mg/dL (ref 5–40)

## 2023-09-03 LAB — MICROALBUMIN / CREATININE URINE RATIO
Creatinine, Urine: 76.6 mg/dL
Microalb/Creat Ratio: 19 mg/g{creat} (ref 0–29)
Microalbumin, Urine: 14.5 ug/mL

## 2023-09-03 LAB — HEMOGLOBIN A1C
Est. average glucose Bld gHb Est-mCnc: 143 mg/dL
Hgb A1c MFr Bld: 6.6 % — ABNORMAL HIGH (ref 4.8–5.6)

## 2023-09-05 ENCOUNTER — Encounter: Payer: Self-pay | Admitting: Family Medicine

## 2023-09-07 ENCOUNTER — Other Ambulatory Visit: Payer: Self-pay | Admitting: Family Medicine

## 2023-09-10 DIAGNOSIS — N904 Leukoplakia of vulva: Secondary | ICD-10-CM | POA: Insufficient documentation

## 2023-09-10 NOTE — Assessment & Plan Note (Signed)
 Recommend continue to work on eating healthy diet and exercise.

## 2023-09-10 NOTE — Assessment & Plan Note (Signed)
Continue clobetasol ointment

## 2023-09-10 NOTE — Assessment & Plan Note (Signed)
 Continue b12 shot weekly.

## 2023-09-10 NOTE — Assessment & Plan Note (Signed)
The current medical regimen is effective;  continue present plan and medications. Continue vitamin D 50K weekly.

## 2023-10-06 ENCOUNTER — Other Ambulatory Visit: Payer: Self-pay | Admitting: Family Medicine

## 2023-10-06 DIAGNOSIS — E039 Hypothyroidism, unspecified: Secondary | ICD-10-CM

## 2023-10-21 ENCOUNTER — Other Ambulatory Visit: Payer: Self-pay

## 2023-10-21 DIAGNOSIS — E1165 Type 2 diabetes mellitus with hyperglycemia: Secondary | ICD-10-CM

## 2023-10-21 MED ORDER — OZEMPIC (2 MG/DOSE) 8 MG/3ML ~~LOC~~ SOPN: PEN_INJECTOR | SUBCUTANEOUS | 0 refills | Status: DC

## 2023-10-23 ENCOUNTER — Other Ambulatory Visit: Payer: Self-pay | Admitting: Family Medicine

## 2023-10-23 DIAGNOSIS — E782 Mixed hyperlipidemia: Secondary | ICD-10-CM

## 2023-11-23 ENCOUNTER — Other Ambulatory Visit: Payer: Self-pay

## 2023-11-23 ENCOUNTER — Other Ambulatory Visit (HOSPITAL_BASED_OUTPATIENT_CLINIC_OR_DEPARTMENT_OTHER): Payer: Self-pay

## 2023-11-23 DIAGNOSIS — E1165 Type 2 diabetes mellitus with hyperglycemia: Secondary | ICD-10-CM

## 2023-11-23 MED ORDER — FOLIC ACID 1 MG PO TABS
1.0000 mg | ORAL_TABLET | Freq: Every day | ORAL | 5 refills | Status: DC
  Filled 2023-11-23: qty 30, 30d supply, fill #0
  Filled 2024-01-05: qty 30, 30d supply, fill #1
  Filled 2024-02-14: qty 30, 30d supply, fill #2
  Filled 2024-03-12 – 2024-04-16 (×2): qty 30, 30d supply, fill #3

## 2023-11-23 MED ORDER — BD LUER-LOK SYRINGE 25G X 5/8" 3 ML MISC
4 refills | Status: AC
  Filled 2023-11-23 – 2024-05-11 (×2): qty 13, 90d supply, fill #0

## 2023-11-23 MED ORDER — OZEMPIC (2 MG/DOSE) 8 MG/3ML ~~LOC~~ SOPN
PEN_INJECTOR | SUBCUTANEOUS | 0 refills | Status: DC
  Filled 2023-11-23: qty 9, 84d supply, fill #0

## 2023-11-23 MED ORDER — HYDROXYCHLOROQUINE SULFATE 200 MG PO TABS
200.0000 mg | ORAL_TABLET | Freq: Two times a day (BID) | ORAL | 1 refills | Status: AC
  Filled 2024-01-05: qty 180, 90d supply, fill #0
  Filled 2024-04-16: qty 180, 90d supply, fill #1

## 2023-11-23 MED ORDER — MELOXICAM 15 MG PO TABS
15.0000 mg | ORAL_TABLET | Freq: Every day | ORAL | 0 refills | Status: DC
  Filled 2023-11-23: qty 90, 90d supply, fill #0

## 2023-11-23 MED ORDER — METHOTREXATE SODIUM CHEMO INJECTION 50 MG/2ML
15.0000 mg | INTRAMUSCULAR | 1 refills | Status: DC
  Filled 2023-11-23: qty 8, 91d supply, fill #0
  Filled 2024-03-12: qty 8, 91d supply, fill #1

## 2023-11-23 MED ORDER — AMOXICILLIN 500 MG PO CAPS
2000.0000 mg | ORAL_CAPSULE | ORAL | 1 refills | Status: DC
  Filled 2023-11-23 (×2): qty 4, 1d supply, fill #0

## 2023-11-23 MED ORDER — DOCUSATE SODIUM 100 MG PO CAPS: 100.0000 mg | ORAL_CAPSULE | Freq: Two times a day (BID) | ORAL | 2 refills | Status: AC

## 2023-11-23 MED FILL — Lisinopril & Hydrochlorothiazide Tab 10-12.5 MG: ORAL | 90 days supply | Qty: 90 | Fill #0 | Status: AC

## 2023-11-23 MED FILL — Rosuvastatin Calcium Tab 10 MG: ORAL | 90 days supply | Qty: 90 | Fill #0 | Status: CN

## 2023-11-23 MED FILL — Levothyroxine Sodium Tab 125 MCG: ORAL | 90 days supply | Qty: 90 | Fill #0 | Status: CN

## 2023-12-03 ENCOUNTER — Other Ambulatory Visit (HOSPITAL_BASED_OUTPATIENT_CLINIC_OR_DEPARTMENT_OTHER): Payer: Self-pay

## 2023-12-05 NOTE — Progress Notes (Unsigned)
 Subjective:  Patient ID: Kathleen Russo, female    DOB: 1961/01/16  Age: 63 y.o. MRN: [id number]  No chief complaint on file.   HPI:  Diabetes:  Complications:hypoglycemia. Glucose checking:  three to four times weekly  Glucose logs: 120s. Poor diet. Exercising since February. Walking daily.  Hypoglycemia: none Most recent A1C: 6.6% Current medications: ozempic  2 mg weekly  Last Eye Exam: few months ago.  Foot checks: daily    Hyperlipidemia: Current medications: Rosuvastatin  10 mg daily    Hypertension: Current medications: Lisinopril -hydrochlorothiazide  10-12.5 daily   Hypothyroidism: on synthroid  112 mcg once daily in am.   RA: [facility] rheumatology following. ON plaquenil  200 mg 2 daily. Came off METHOTREXATE  because it made her feel bad. Joints are hurting. Sees Vinita Greenspan, NP.     Vitamin D  deficiency: taking vitamin D  once weekly.   Vitamin B12 deficiency: Monthly injections.        09/02/2023    7:33 AM 05/06/2023    7:34 AM 01/15/2023    7:29 AM 09/04/2022    7:57 AM 07/28/2021   11:22 AM  Depression screen PHQ 2/9  Decreased Interest 0  0 0 0  Down, Depressed, Hopeless 0  0 0 0  PHQ - 2 Score 0  0 0 0  Altered sleeping  0 0    Tired, decreased energy  0 0    Change in appetite  0 0    Feeling bad or failure about yourself   0 0    Trouble concentrating  0 0    Moving slowly or fidgety/restless  0 0    Suicidal thoughts  0 0    PHQ-9 Score   0    Difficult doing work/chores  Not difficult at all Not difficult at all          09/02/2023    7:33 AM  Fall Risk   Falls in the past year? 0  Number falls in past yr: 0  Injury with Fall? 0  Risk for fall due to : No Fall Risks    Patient Care Team: Anibal Quinby, Burleigh Carp, MD as PCP - General (Family Medicine)   Review of Systems  Current Outpatient Medications on File Prior to Visit  Medication Sig Dispense Refill   acetaminophen  (TYLENOL ) 500 MG tablet Take 2 tablets (1,000 mg total) by mouth 4  (four) times daily. 120 tablet 3   amoxicillin  (AMOXIL ) 500 MG capsule Take 4 capsules (2,000 mg total) by mouth 1 hour before dental procedure 4 capsule 1   benzonatate  (TESSALON ) 200 MG capsule Take 1 capsule (200 mg total) by mouth 3 (three) times daily as needed for cough. 30 capsule 5   calcium  carbonate (OSCAL) 1500 (600 Ca) MG TABS tablet Take 600 mg of elemental calcium  by mouth daily.     clobetasol  ointment (TEMOVATE ) 0.05 % Apply 1 Application topically to affected area 2 (two) times daily. 30 g 2   cyanocobalamin  (VITAMIN B12) 1000 MCG/ML injection Inject 1 ml weekly for 4 weeks then 1 ml every 4 weeks. 10 mL 1   docusate sodium  (COLACE) 100 MG capsule Take 1 capsule (100 mg total) by mouth 2 (two) times daily. 60 capsule 2   fluticasone  (FLONASE ) 50 MCG/ACT nasal spray Place 2 sprays into both nostrils daily. 16 g 6   folic acid  (FOLVITE ) 1 MG tablet Take 1 tablet (1 mg total) by mouth daily. 30 tablet 5   hydroxychloroquine  (PLAQUENIL ) 200 MG tablet Take 200 mg by  mouth 2 (two) times daily.     hydroxychloroquine  (PLAQUENIL ) 200 MG tablet Take 1 tablet (200 mg total) by mouth 2 (two) times daily with food or milk 180 tablet 1   levothyroxine  (SYNTHROID ) 125 MCG tablet Take 1 tablet (125 mcg total) by mouth daily before breakfast 90 tablet 1   lisinopril -hydrochlorothiazide  (ZESTORETIC ) 10-12.5 MG tablet TAKE 1 TABLET BY MOUTH EVERY DAY 90 tablet 1   loratadine  (CLARITIN ) 10 MG tablet Take 1 tablet (10 mg total) by mouth daily as needed for allergies. 90 tablet 3   meloxicam  (MOBIC ) 15 MG tablet Take 1 tablet (15 mg total) by mouth daily. 90 tablet 0   methocarbamol  (ROBAXIN ) 750 MG tablet Take 1 tablet (750 mg total) by mouth 4 (four) times daily. 120 tablet 1   methotrexate  50 MG/2ML injection Inject 0.6 mLs (15 mg total) into the skin once a week on the same day of the week 10 mL 1   montelukast  (SINGULAIR ) 10 MG tablet Take 1 tablet (10 mg total) by mouth at bedtime. 90 tablet 3    polyethylene glycol powder (MIRALAX ) 17 GM/SCOOP powder Take 8.5-34 g by mouth daily. To correct constipation.  Adjust dose over 1-2 months.  Goal = ~1 bowel movement / day     rosuvastatin  (CRESTOR ) 10 MG tablet Take 1 tablet (10 mg total) by mouth daily. 90 tablet 0   Semaglutide , 2 MG/DOSE, (OZEMPIC , 2 MG/DOSE,) 8 MG/3ML SOPN INJECT 2 MG SUBCUTANEOUS ONCE WEEKLY 9 mL 0   SYRINGE-NEEDLE, DISP, 3 ML (B-D 3CC LUER-LOK SYR 25GX5/8) 25G X 5/8 3 ML MISC Use once a week as directed 13 each 4   Vitamin D , Ergocalciferol , (DRISDOL ) 1.25 MG (50000 UNIT) CAPS capsule TAKE 1 CAPSULE BY MOUTH EVERY 7 DAYS (Patient taking differently: Take 50,000 Units by mouth every 7 (seven) days. Tuesdays last dose 04/20/2023) 12 capsule 1   No current facility-administered medications on file prior to visit.   Past Medical History:  Diagnosis Date   Anemia    Arthritis    Cancer (HCC)    UTERINE   Diabetes mellitus    DM2, not requiring meds as of 04/2011   Dizziness    GERD (gastroesophageal reflux disease)    Hemorrhoids    Hyperlipidemia    Hypertension    Hypothyroidism    Lichen sclerosus    Perirectal abscess 03/09/2022   PONV (postoperative nausea and vomiting)    Primary osteoarthritis    Sleep apnea    USES CPAP   Swelling of both ankles    Type 2 diabetes mellitus (HCC)    Past Surgical History:  Procedure Laterality Date   ABDOMINAL HYSTERECTOMY     BARIATRIC SURGERY     BREAST LUMPECTOMY Left    BREAST SURGERY  1979   ELBOW SURGERY  1990'S   GASTRIC BYPASS  2007   INCISION AND DRAINAGE PERIRECTAL ABSCESS N/A 03/09/2022   Procedure: IRRIGATION AND DEBRIDEMENT PERIRECTAL ABSCESS;  Surgeon: Enid Harry, MD;  Location: [facility];  Service: General;  Laterality: N/A;   INCISION AND DRAINAGE PERIRECTAL ABSCESS N/A 04/22/2023   Procedure: IRRIGATION AND DEBRIDEMENT PERIRECTAL ABSCESS;  Surgeon: Anda Bamberg, MD;  Location: [facility];  Service: General;  Laterality: N/A;   JOINT  REPLACEMENT  2002   RT. KNEE   KNEE ARTHROSCOPY  1993   LT. KNEE   KNEE ARTHROSCOPY W/ LATERAL RELEASE  1987   RT. KNEE   left hip replacement  05/2017   SHOULDER ARTHROSCOPY  Right 05/22/2022   SHOULDER SURGERY Right 01/20/2022   rotator cuff repair   TONSILLECTOMY     TOTAL KNEE ARTHROPLASTY  07/06/2011   Procedure: TOTAL KNEE ARTHROPLASTY;  Surgeon: Florencia Hunter, MD;  Location: [facility];  Service: Orthopedics;  Laterality: Left;    Family History  Problem Relation Age of Onset   Hypertension Mother    Thyroid disease Mother    Diabetes Mother    Other Father        Wegoners disease   Thyroid disease Sister    Diabetes Sister    Breast cancer Daughter    Breast cancer Maternal Aunt    Breast cancer Paternal Aunt    Graves' disease Brother    Social History   Socioeconomic History   Marital status: Married    Spouse name: Not on file   Number of children: Not on file   Years of education: Not on file   Highest education level: Associate degree: occupational, Scientist, product/process development, or vocational program  Occupational History   Occupation: retired    Comment: [employer]  Tobacco Use   Smoking status: Never   Smokeless tobacco: Never  Vaping Use   Vaping status: Never Used  Substance and Sexual Activity   Alcohol use: No   Drug use: No   Sexual activity: Yes    Partners: Male    Birth control/protection: Surgical    Comment: hysterectomy  Other Topics Concern   Not on file  Social History Narrative   Not on file   Social Drivers of Health   Financial Resource Strain: Low Risk  (05/05/2023)   Overall Financial Resource Strain (CARDIA)    Difficulty of Paying Living Expenses: Not hard at all  Food Insecurity: No Food Insecurity (05/05/2023)   Hunger Vital Sign    Worried About Running Out of Food in the Last Year: Never true    Ran Out of Food in the Last Year: Never true  Transportation Needs: No Transportation Needs (05/05/2023)   PRAPARE - Scientist, research (physical sciences) (Medical): No    Lack of Transportation (Non-Medical): No  Physical Activity: Unknown (05/05/2023)   Exercise Vital Sign    Days of Exercise per Week: 0 days    Minutes of Exercise per Session: Not on file  Recent Concern: Physical Activity - Inactive (05/05/2023)   Exercise Vital Sign    Days of Exercise per Week: 0 days    Minutes of Exercise per Session: 0 min  Stress: No Stress Concern Present (05/05/2023)   [employer] of Occupational Health - Occupational Stress Questionnaire    Feeling of Stress : Not at all  Social Connections: Moderately Integrated (05/05/2023)   Social Connection and Isolation Panel    Frequency of Communication with Friends and Family: Never    Frequency of Social Gatherings with Friends and Family: Twice a week    Attends Religious Services: More than 4 times per year    Active Member of [employer] or Organizations: Yes    Attends Engineer, structural: More than 4 times per year    Marital Status: Married    Objective:  There were no vitals taken for this visit.     09/02/2023    7:30 AM 05/06/2023    7:27 AM 04/22/2023    3:32 PM  BP/Weight  Systolic BP 110 102 112  Diastolic BP 66 70 61  Wt. (Lbs) 255 246.6   BMI 42.43 kg/m2 41.04 kg/m2  Physical Exam  {Perform Simple Foot Exam  Perform Detailed exam:1} {Insert foot Exam (Optional):30965}   Lab Results  Component Value Date   WBC 6.0 09/02/2023   HGB 12.5 09/02/2023   HCT 39.0 09/02/2023   PLT 295 09/02/2023   GLUCOSE 102 (H) 09/02/2023   CHOL 109 09/02/2023   TRIG 137 09/02/2023   HDL 44 09/02/2023   LDLCALC 41 09/02/2023   ALT 14 09/02/2023   AST 13 09/02/2023   NA 141 09/02/2023   K 4.1 09/02/2023   CL 101 09/02/2023   CREATININE 0.69 09/02/2023   BUN 18 09/02/2023   CO2 25 09/02/2023   TSH 2.530 03/02/2023   INR 0.92 06/25/2011   HGBA1C 6.6 (H) 09/02/2023   MICROALBUR 30 07/24/2020      Assessment & Plan:  Hypertension associated with  diabetes (HCC)  Other specified hypothyroidism  Mixed hyperlipidemia  Rheumatoid arthritis involving multiple sites with positive rheumatoid factor (HCC)  Vitamin D  deficiency     No orders of the defined types were placed in this encounter.   No orders of the defined types were placed in this encounter.    Follow-up: No follow-ups on file.   I,Marla I Leal-Borjas,acting as a scribe for Mercy Stall, MD.,have documented all relevant documentation on the behalf of Mercy Stall, MD,as directed by  Mercy Stall, MD while in the presence of Mercy Stall, MD.   An After Visit Summary was printed and given to the patient.  Mercy Stall, MD [facility] [phone]

## 2023-12-06 ENCOUNTER — Ambulatory Visit: Admitting: Family Medicine

## 2023-12-06 ENCOUNTER — Encounter: Payer: Self-pay | Admitting: Family Medicine

## 2023-12-06 VITALS — BP 128/68 | HR 81 | Temp 98.0°F | Ht 65.0 in | Wt 254.0 lb

## 2023-12-06 DIAGNOSIS — E1159 Type 2 diabetes mellitus with other circulatory complications: Secondary | ICD-10-CM | POA: Diagnosis not present

## 2023-12-06 DIAGNOSIS — E782 Mixed hyperlipidemia: Secondary | ICD-10-CM | POA: Diagnosis not present

## 2023-12-06 DIAGNOSIS — M0579 Rheumatoid arthritis with rheumatoid factor of multiple sites without organ or systems involvement: Secondary | ICD-10-CM

## 2023-12-06 DIAGNOSIS — I152 Hypertension secondary to endocrine disorders: Secondary | ICD-10-CM

## 2023-12-06 DIAGNOSIS — E038 Other specified hypothyroidism: Secondary | ICD-10-CM | POA: Diagnosis not present

## 2023-12-06 DIAGNOSIS — E538 Deficiency of other specified B group vitamins: Secondary | ICD-10-CM

## 2023-12-06 DIAGNOSIS — E559 Vitamin D deficiency, unspecified: Secondary | ICD-10-CM

## 2023-12-06 NOTE — Assessment & Plan Note (Signed)
 Diabetes and hypertension well controlled.  Continue to check glucose daily.  Check feet daily.  No changes to medicines. Lisinopril-hydrochlorothiazide 10-12.5 daily and ozempic 2 mg weekly.  Continue to work on eating a healthy diet and exercise.  Labs drawn today.   Check feet daily. Go to eye doctor annually.

## 2023-12-06 NOTE — Assessment & Plan Note (Signed)
 Management per specialist.

## 2023-12-06 NOTE — Assessment & Plan Note (Signed)
Continue b12 shot monthly

## 2023-12-06 NOTE — Assessment & Plan Note (Signed)
Well controlled.  No changes to medicines. Continue rosuvastatin 10 mg before bed  Continue to work on eating a healthy diet and exercise.  Labs drawn today.   

## 2023-12-06 NOTE — Assessment & Plan Note (Signed)
 Previously well controlled Continue Synthroid 125 mcg daily daily

## 2023-12-06 NOTE — Assessment & Plan Note (Signed)
The current medical regimen is effective;  continue present plan and medications. Continue vitamin D 50K weekly.

## 2023-12-07 ENCOUNTER — Ambulatory Visit: Payer: Self-pay | Admitting: Family Medicine

## 2023-12-07 LAB — CBC WITH DIFFERENTIAL/PLATELET
Basophils Absolute: 0.1 10*3/uL (ref 0.0–0.2)
Basos: 1 %
EOS (ABSOLUTE): 0.1 10*3/uL (ref 0.0–0.4)
Eos: 2 %
Hematocrit: 38.6 % (ref 34.0–46.6)
Hemoglobin: 13 g/dL (ref 11.1–15.9)
Immature Grans (Abs): 0 10*3/uL (ref 0.0–0.1)
Immature Granulocytes: 0 %
Lymphocytes Absolute: 1.1 10*3/uL (ref 0.7–3.1)
Lymphs: 18 %
MCH: 29.1 pg (ref 26.6–33.0)
MCHC: 33.7 g/dL (ref 31.5–35.7)
MCV: 86 fL (ref 79–97)
Monocytes Absolute: 0.4 10*3/uL (ref 0.1–0.9)
Monocytes: 6 %
Neutrophils Absolute: 4.2 10*3/uL (ref 1.4–7.0)
Neutrophils: 73 %
Platelets: 301 10*3/uL (ref 150–450)
RBC: 4.47 x10E6/uL (ref 3.77–5.28)
RDW: 14.1 % (ref 11.7–15.4)
WBC: 5.8 10*3/uL (ref 3.4–10.8)

## 2023-12-07 LAB — COMPREHENSIVE METABOLIC PANEL WITH GFR
ALT: 12 IU/L (ref 0–32)
AST: 10 IU/L (ref 0–40)
Albumin: 4.2 g/dL (ref 3.9–4.9)
Alkaline Phosphatase: 105 IU/L (ref 44–121)
BUN/Creatinine Ratio: 27 (ref 12–28)
BUN: 17 mg/dL (ref 8–27)
Bilirubin Total: 0.3 mg/dL (ref 0.0–1.2)
CO2: 21 mmol/L (ref 20–29)
Calcium: 9 mg/dL (ref 8.7–10.3)
Chloride: 104 mmol/L (ref 96–106)
Creatinine, Ser: 0.64 mg/dL (ref 0.57–1.00)
Globulin, Total: 2 g/dL (ref 1.5–4.5)
Glucose: 110 mg/dL — ABNORMAL HIGH (ref 70–99)
Potassium: 4.1 mmol/L (ref 3.5–5.2)
Sodium: 144 mmol/L (ref 134–144)
Total Protein: 6.2 g/dL (ref 6.0–8.5)
eGFR: 99 mL/min/{1.73_m2} (ref >59)

## 2023-12-07 LAB — HEMOGLOBIN A1C
Est. average glucose Bld gHb Est-mCnc: 143 mg/dL
Hgb A1c MFr Bld: 6.6 % — ABNORMAL HIGH (ref 4.8–5.6)

## 2023-12-07 LAB — LIPID PANEL
Chol/HDL Ratio: 2.4 ratio (ref 0.0–4.4)
Cholesterol, Total: 116 mg/dL (ref 100–199)
HDL: 48 mg/dL (ref >39)
LDL Chol Calc (NIH): 50 mg/dL (ref 0–99)
Triglycerides: 98 mg/dL (ref 0–149)
VLDL Cholesterol Cal: 18 mg/dL (ref 5–40)

## 2023-12-07 LAB — TSH: TSH: 1.31 u[IU]/mL (ref 0.450–4.500)

## 2023-12-07 LAB — VITAMIN D 25 HYDROXY (VIT D DEFICIENCY, FRACTURES): Vit D, 25-Hydroxy: 61.9 ng/mL (ref 30.0–100.0)

## 2023-12-08 ENCOUNTER — Other Ambulatory Visit: Payer: Self-pay | Admitting: Family Medicine

## 2024-01-05 ENCOUNTER — Other Ambulatory Visit (HOSPITAL_BASED_OUTPATIENT_CLINIC_OR_DEPARTMENT_OTHER): Payer: Self-pay

## 2024-01-05 ENCOUNTER — Other Ambulatory Visit: Payer: Self-pay | Admitting: Family Medicine

## 2024-01-05 MED ORDER — VITAMIN D (ERGOCALCIFEROL) 1.25 MG (50000 UNIT) PO CAPS
50000.0000 [IU] | ORAL_CAPSULE | ORAL | 1 refills | Status: DC
  Filled 2024-01-05: qty 12, 84d supply, fill #0
  Filled 2024-02-14 – 2024-03-22 (×2): qty 12, 84d supply, fill #1

## 2024-01-05 MED FILL — Levothyroxine Sodium Tab 125 MCG: ORAL | 90 days supply | Qty: 90 | Fill #0 | Status: AC

## 2024-01-05 MED FILL — Rosuvastatin Calcium Tab 10 MG: ORAL | 90 days supply | Qty: 90 | Fill #0 | Status: AC

## 2024-01-06 ENCOUNTER — Other Ambulatory Visit (HOSPITAL_BASED_OUTPATIENT_CLINIC_OR_DEPARTMENT_OTHER): Payer: Self-pay

## 2024-01-22 LAB — LAB REPORT - SCANNED: EGFR: 77

## 2024-02-14 ENCOUNTER — Other Ambulatory Visit: Payer: Self-pay | Admitting: Family Medicine

## 2024-02-14 ENCOUNTER — Other Ambulatory Visit (HOSPITAL_BASED_OUTPATIENT_CLINIC_OR_DEPARTMENT_OTHER): Payer: Self-pay

## 2024-02-14 DIAGNOSIS — E1165 Type 2 diabetes mellitus with hyperglycemia: Secondary | ICD-10-CM

## 2024-02-14 MED ORDER — LISINOPRIL-HYDROCHLOROTHIAZIDE 10-12.5 MG PO TABS
1.0000 | ORAL_TABLET | Freq: Every day | ORAL | 1 refills | Status: AC
  Filled 2024-02-14: qty 90, 90d supply, fill #0
  Filled 2024-06-16: qty 90, 90d supply, fill #1

## 2024-02-14 MED ORDER — OZEMPIC (2 MG/DOSE) 8 MG/3ML ~~LOC~~ SOPN
2.0000 mg | PEN_INJECTOR | SUBCUTANEOUS | 1 refills | Status: AC
  Filled 2024-02-14: qty 9, 84d supply, fill #0
  Filled 2024-05-11: qty 9, 84d supply, fill #1

## 2024-02-14 MED ORDER — MELOXICAM 15 MG PO TABS
15.0000 mg | ORAL_TABLET | Freq: Every day | ORAL | 0 refills | Status: DC
  Filled 2024-02-14: qty 90, 90d supply, fill #0

## 2024-02-15 ENCOUNTER — Other Ambulatory Visit (HOSPITAL_BASED_OUTPATIENT_CLINIC_OR_DEPARTMENT_OTHER): Payer: Self-pay

## 2024-03-12 ENCOUNTER — Other Ambulatory Visit (HOSPITAL_COMMUNITY): Payer: Self-pay

## 2024-03-13 ENCOUNTER — Other Ambulatory Visit (HOSPITAL_COMMUNITY): Payer: Self-pay

## 2024-03-13 ENCOUNTER — Other Ambulatory Visit: Payer: Self-pay

## 2024-03-14 ENCOUNTER — Encounter: Payer: Self-pay | Admitting: Pharmacist

## 2024-03-14 ENCOUNTER — Other Ambulatory Visit: Payer: Self-pay

## 2024-03-14 ENCOUNTER — Other Ambulatory Visit (HOSPITAL_BASED_OUTPATIENT_CLINIC_OR_DEPARTMENT_OTHER): Payer: Self-pay

## 2024-03-14 MED ORDER — METHOTREXATE SODIUM CHEMO INJECTION 250 MG/10ML
20.0000 mg | INTRAMUSCULAR | 1 refills | Status: AC
  Filled 2024-03-14: qty 10, 84d supply, fill #0

## 2024-03-15 ENCOUNTER — Other Ambulatory Visit (HOSPITAL_BASED_OUTPATIENT_CLINIC_OR_DEPARTMENT_OTHER): Payer: Self-pay

## 2024-03-15 NOTE — Assessment & Plan Note (Signed)
The current medical regimen is effective;  continue present plan and medications. Continue vitamin D 50K weekly.

## 2024-03-15 NOTE — Progress Notes (Signed)
 Subjective:  Patient ID: Kathleen Russo, female    DOB: 12-24-1960  Age: 63 y.o. MRN: [id number]  No chief complaint on file.   HPI: Discussed the use of AI scribe software for clinical note transcription with the patient, who gave verbal consent to proceed.  History of Present Illness        09/02/2023    7:33 AM 05/06/2023    7:34 AM 01/15/2023    7:29 AM 09/04/2022    7:57 AM 07/28/2021   11:22 AM  Depression screen PHQ 2/9  Decreased Interest 0  0 0 0  Down, Depressed, Hopeless 0  0 0 0  PHQ - 2 Score 0  0 0 0  Altered sleeping  0 0    Tired, decreased energy  0 0    Change in appetite  0 0    Feeling bad or failure about yourself   0 0    Trouble concentrating  0 0    Moving slowly or fidgety/restless  0 0    Suicidal thoughts  0 0    PHQ-9 Score   0    Difficult doing work/chores  Not difficult at all Not difficult at all          09/02/2023    7:33 AM  Fall Risk   Falls in the past year? 0  Number falls in past yr: 0  Injury with Fall? 0  Risk for fall due to : No Fall Risks    Patient Care Team: Hetvi Shawhan, Abigail, MD as PCP - General (Family Medicine)   Review of Systems  Current Outpatient Medications on File Prior to Visit  Medication Sig Dispense Refill   acetaminophen  (TYLENOL ) 500 MG tablet Take 2 tablets (1,000 mg total) by mouth 4 (four) times daily. 120 tablet 3   calcium  carbonate (OSCAL) 1500 (600 Ca) MG TABS tablet Take 600 mg of elemental calcium  by mouth daily.     clobetasol  ointment (TEMOVATE ) 0.05 % Apply 1 Application topically to affected area 2 (two) times daily. 30 g 2   cyanocobalamin  (VITAMIN B12) 1000 MCG/ML injection Inject 1 ml weekly for 4 weeks then 1 ml every 4 weeks. 10 mL 1   docusate sodium  (COLACE) 100 MG capsule Take 1 capsule (100 mg total) by mouth 2 (two) times daily. 60 capsule 2   fluticasone  (FLONASE ) 50 MCG/ACT nasal spray Place 2 sprays into both nostrils daily. 16 g 6   folic acid  (FOLVITE ) 1 MG tablet Take 1  tablet (1 mg total) by mouth daily. 30 tablet 5   hydroxychloroquine  (PLAQUENIL ) 200 MG tablet Take 200 mg by mouth 2 (two) times daily.     hydroxychloroquine  (PLAQUENIL ) 200 MG tablet Take 1 tablet (200 mg total) by mouth 2 (two) times daily with food or milk 180 tablet 1   levothyroxine  (SYNTHROID ) 125 MCG tablet Take 1 tablet (125 mcg total) by mouth daily before breakfast 90 tablet 1   lisinopril -hydrochlorothiazide  (ZESTORETIC ) 10-12.5 MG tablet TAKE 1 TABLET BY MOUTH EVERY DAY 90 tablet 1   loratadine  (CLARITIN ) 10 MG tablet Take 1 tablet (10 mg total) by mouth daily as needed for allergies. 90 tablet 3   meloxicam  (MOBIC ) 15 MG tablet Take 1 tablet (15 mg total) by mouth daily. 90 tablet 0   methocarbamol  (ROBAXIN ) 750 MG tablet Take 1 tablet (750 mg total) by mouth 4 (four) times daily. 120 tablet 1   methotrexate  250 MG/10ML injection Inject 15 mg into the skin once  a week.     methotrexate  250 MG/10ML injection Inject 0.8 mLs (20 mg total) into the skin once a week on the same day 10 mL 1   montelukast  (SINGULAIR ) 10 MG tablet Take 1 tablet (10 mg total) by mouth at bedtime. 90 tablet 3   polyethylene glycol powder (MIRALAX ) 17 GM/SCOOP powder Take 8.5-34 g by mouth daily. To correct constipation.  Adjust dose over 1-2 months.  Goal = ~1 bowel movement / day     rosuvastatin  (CRESTOR ) 10 MG tablet Take 1 tablet (10 mg total) by mouth daily. 90 tablet 0   Semaglutide , 2 MG/DOSE, (OZEMPIC , 2 MG/DOSE,) 8 MG/3ML SOPN Inject 2 mg into the skin once a week. 9 mL 1   SYRINGE-NEEDLE, DISP, 3 ML (B-D 3CC LUER-LOK SYR 25GX5/8) 25G X 5/8 3 ML MISC Use once a week as directed 13 each 4   Vitamin D , Ergocalciferol , (DRISDOL ) 1.25 MG (50000 UNIT) CAPS capsule Take 1 capsule (50,000 Units total) by mouth every 7 (seven) days. 12 capsule 1   No current facility-administered medications on file prior to visit.   Past Medical History:  Diagnosis Date   Anemia    Arthritis    Cancer (HCC)     UTERINE   Diabetes mellitus    DM2, not requiring meds as of 04/2011   Dizziness    GERD (gastroesophageal reflux disease)    Hemorrhoids    History of left hip replacement 08/27/2017   Hyperlipidemia    Hypertension    Hypothyroidism    Lichen sclerosus    OSA (obstructive sleep apnea)    Perirectal abscess 03/09/2022   PONV (postoperative nausea and vomiting)    Primary osteoarthritis    Sleep apnea    USES CPAP   Swelling of both ankles    Type 2 diabetes mellitus (HCC)    Past Surgical History:  Procedure Laterality Date   ABDOMINAL HYSTERECTOMY     BARIATRIC SURGERY     BREAST LUMPECTOMY Left    BREAST SURGERY  1979   ELBOW SURGERY  1990'S   GASTRIC BYPASS  2007   INCISION AND DRAINAGE PERIRECTAL ABSCESS N/A 03/09/2022   Procedure: IRRIGATION AND DEBRIDEMENT PERIRECTAL ABSCESS;  Surgeon: Ebbie Cough, MD;  Location: [facility];  Service: General;  Laterality: N/A;   INCISION AND DRAINAGE PERIRECTAL ABSCESS N/A 04/22/2023   Procedure: IRRIGATION AND DEBRIDEMENT PERIRECTAL ABSCESS;  Surgeon: Paola Dreama SAILOR, MD;  Location: [facility];  Service: General;  Laterality: N/A;   JOINT REPLACEMENT  2002   RT. KNEE   KNEE ARTHROSCOPY  1993   LT. KNEE   KNEE ARTHROSCOPY W/ LATERAL RELEASE  1987   RT. KNEE   left hip replacement  05/2017   SHOULDER ARTHROSCOPY Right 05/22/2022   SHOULDER SURGERY Right 01/20/2022   rotator cuff repair   TONSILLECTOMY     TOTAL KNEE ARTHROPLASTY  07/06/2011   Procedure: TOTAL KNEE ARTHROPLASTY;  Surgeon: Garnette JONETTA Raman, MD;  Location: [facility];  Service: Orthopedics;  Laterality: Left;    Family History  Problem Relation Age of Onset   Hypertension Mother    Thyroid disease Mother    Diabetes Mother    Other Father        Wegoners disease   Thyroid disease Sister    Diabetes Sister    Breast cancer Daughter    Breast cancer Maternal Aunt    Breast cancer Paternal Wylie Gavel' disease Brother    Social History  Socioeconomic  History   Marital status: Married    Spouse name: Not on file   Number of children: Not on file   Years of education: Not on file   Highest education level: Associate degree: occupational, Scientist, product/process development, or vocational program  Occupational History   Occupation: retired    Comment: [employer]  Tobacco Use   Smoking status: Never   Smokeless tobacco: Never  Vaping Use   Vaping status: Never Used  Substance and Sexual Activity   Alcohol use: No   Drug use: No   Sexual activity: Yes    Partners: Male    Birth control/protection: Surgical    Comment: hysterectomy  Other Topics Concern   Not on file  Social History Narrative   Not on file   Social Drivers of Health   Financial Resource Strain: Low Risk  (12/06/2023)   Overall Financial Resource Strain (CARDIA)    Difficulty of Paying Living Expenses: Not hard at all  Food Insecurity: No Food Insecurity (12/06/2023)   Hunger Vital Sign    Worried About Running Out of Food in the Last Year: Never true    Ran Out of Food in the Last Year: Never true  Transportation Needs: No Transportation Needs (12/06/2023)   PRAPARE - Administrator, Civil Service (Medical): No    Lack of Transportation (Non-Medical): No  Physical Activity: Inactive (12/06/2023)   Exercise Vital Sign    Days of Exercise per Week: 0 days    Minutes of Exercise per Session: 0 min  Stress: No Stress Concern Present (12/06/2023)   [employer] of Occupational Health - Occupational Stress Questionnaire    Feeling of Stress: Not at all  Social Connections: Socially Integrated (12/06/2023)   Social Connection and Isolation Panel    Frequency of Communication with Friends and Family: Once a week    Frequency of Social Gatherings with Friends and Family: Twice a week    Attends Religious Services: More than 4 times per year    Active Member of [employer] or Organizations: Yes    Attends Engineer, structural: More than 4 times per year    Marital Status:  Married    Objective:  There were no vitals taken for this visit.     12/06/2023    7:57 AM 09/02/2023    7:30 AM 05/06/2023    7:27 AM  BP/Weight  Systolic BP 128 110 102  Diastolic BP 68 66 70  Wt. (Lbs) 254 255 246.6  BMI 42.27 kg/m2 42.43 kg/m2 41.04 kg/m2    Physical Exam Vitals reviewed.  Constitutional:      Appearance: Normal appearance. She is normal weight.  Neck:     Vascular: No carotid bruit.  Cardiovascular:     Rate and Rhythm: Normal rate and regular rhythm.     Heart sounds: Normal heart sounds.  Pulmonary:     Effort: Pulmonary effort is normal. No respiratory distress.     Breath sounds: Normal breath sounds.  Abdominal:     General: Abdomen is flat. Bowel sounds are normal.     Palpations: Abdomen is soft.     Tenderness: There is no abdominal tenderness.  Neurological:     Mental Status: She is alert and oriented to person, place, and time.  Psychiatric:        Mood and Affect: Mood normal.        Behavior: Behavior normal.     {Perform Simple Foot Exam  Perform Detailed exam:1} {  Insert foot Exam (Optional):30965}   Lab Results  Component Value Date   WBC 5.8 12/06/2023   HGB 13.0 12/06/2023   HCT 38.6 12/06/2023   PLT 301 12/06/2023   GLUCOSE 110 (H) 12/06/2023   CHOL 116 12/06/2023   TRIG 98 12/06/2023   HDL 48 12/06/2023   LDLCALC 50 12/06/2023   ALT 12 12/06/2023   AST 10 12/06/2023   NA 144 12/06/2023   K 4.1 12/06/2023   CL 104 12/06/2023   CREATININE 0.64 12/06/2023   BUN 17 12/06/2023   CO2 21 12/06/2023   TSH 1.310 12/06/2023   INR 0.92 06/25/2011   HGBA1C 6.6 (H) 12/06/2023    Results for orders placed or performed in visit on 12/06/23  CBC with Differential/Platelet   Collection Time: 12/06/23  8:21 AM  Result Value Ref Range   WBC 5.8 3.4 - 10.8 x10E3/uL   RBC 4.47 3.77 - 5.28 x10E6/uL   Hemoglobin 13.0 11.1 - 15.9 g/dL   Hematocrit 61.3 65.9 - 46.6 %   MCV 86 79 - 97 fL   MCH 29.1 26.6 - 33.0 pg   MCHC  33.7 31.5 - 35.7 g/dL   RDW 85.8 88.2 - 84.5 %   Platelets 301 150 - 450 x10E3/uL   Neutrophils 73 Not Estab. %   Lymphs 18 Not Estab. %   Monocytes 6 Not Estab. %   Eos 2 Not Estab. %   Basos 1 Not Estab. %   Neutrophils Absolute 4.2 1.4 - 7.0 x10E3/uL   Lymphocytes Absolute 1.1 0.7 - 3.1 x10E3/uL   Monocytes Absolute 0.4 0.1 - 0.9 x10E3/uL   EOS (ABSOLUTE) 0.1 0.0 - 0.4 x10E3/uL   Basophils Absolute 0.1 0.0 - 0.2 x10E3/uL   Immature Granulocytes 0 Not Estab. %   Immature Grans (Abs) 0.0 0.0 - 0.1 x10E3/uL  Comprehensive metabolic panel with GFR   Collection Time: 12/06/23  8:21 AM  Result Value Ref Range   Glucose 110 (H) 70 - 99 mg/dL   BUN 17 8 - 27 mg/dL   Creatinine, Ser 9.35 0.57 - 1.00 mg/dL   eGFR 99 >40 fO/fpw/8.26   BUN/Creatinine Ratio 27 12 - 28   Sodium 144 134 - 144 mmol/L   Potassium 4.1 3.5 - 5.2 mmol/L   Chloride 104 96 - 106 mmol/L   CO2 21 20 - 29 mmol/L   Calcium  9.0 8.7 - 10.3 mg/dL   Total Protein 6.2 6.0 - 8.5 g/dL   Albumin 4.2 3.9 - 4.9 g/dL   Globulin, Total 2.0 1.5 - 4.5 g/dL   Bilirubin Total 0.3 0.0 - 1.2 mg/dL   Alkaline Phosphatase 105 44 - 121 IU/L   AST 10 0 - 40 IU/L   ALT 12 0 - 32 IU/L  Hemoglobin A1c   Collection Time: 12/06/23  8:21 AM  Result Value Ref Range   Hgb A1c MFr Bld 6.6 (H) 4.8 - 5.6 %   Est. average glucose Bld gHb Est-mCnc 143 mg/dL  Lipid panel   Collection Time: 12/06/23  8:21 AM  Result Value Ref Range   Cholesterol, Total 116 100 - 199 mg/dL   Triglycerides 98 0 - 149 mg/dL   HDL 48 >60 mg/dL   VLDL Cholesterol Cal 18 5 - 40 mg/dL   LDL Chol Calc (NIH) 50 0 - 99 mg/dL   Chol/HDL Ratio 2.4 0.0 - 4.4 ratio  VITAMIN D  25 Hydroxy (Vit-D Deficiency, Fractures)   Collection Time: 12/06/23  8:21 AM  Result Value  Ref Range   Vit D, 25-Hydroxy 61.9 30.0 - 100.0 ng/mL  TSH   Collection Time: 12/06/23  8:21 AM  Result Value Ref Range   TSH 1.310 0.450 - 4.500 uIU/mL  .  Assessment & Plan:   Assessment &  Plan Hypertension associated with diabetes (HCC)     Rheumatoid arthritis involving multiple sites with positive rheumatoid factor (HCC)     Mixed hyperlipidemia     Other specified hypothyroidism     Vitamin D  deficiency     B12 deficiency     Class 3 severe obesity due to excess calories with serious comorbidity and body mass index (BMI) of 40.0 to 44.9 in adult       There is no height or weight on file to calculate BMI.  Assessment and Plan Assessment & Plan      No orders of the defined types were placed in this encounter.   No orders of the defined types were placed in this encounter.      Follow-up: No follow-ups on file.  An After Visit Summary was printed and given to the patient.  Abigail Free, MD [facility] [phone]

## 2024-03-15 NOTE — Assessment & Plan Note (Signed)
 Kathleen Russo

## 2024-03-15 NOTE — Assessment & Plan Note (Signed)
Continue b12 shot monthly

## 2024-03-15 NOTE — Assessment & Plan Note (Signed)
 SABRA

## 2024-03-16 ENCOUNTER — Ambulatory Visit: Admitting: Family Medicine

## 2024-03-16 ENCOUNTER — Encounter: Payer: Self-pay | Admitting: Family Medicine

## 2024-03-16 ENCOUNTER — Other Ambulatory Visit (HOSPITAL_BASED_OUTPATIENT_CLINIC_OR_DEPARTMENT_OTHER): Payer: Self-pay

## 2024-03-16 VITALS — BP 126/82 | HR 82 | Temp 98.2°F | Ht 65.0 in | Wt 256.0 lb

## 2024-03-16 DIAGNOSIS — Z23 Encounter for immunization: Secondary | ICD-10-CM | POA: Diagnosis not present

## 2024-03-16 DIAGNOSIS — E038 Other specified hypothyroidism: Secondary | ICD-10-CM

## 2024-03-16 DIAGNOSIS — E1159 Type 2 diabetes mellitus with other circulatory complications: Secondary | ICD-10-CM

## 2024-03-16 DIAGNOSIS — E782 Mixed hyperlipidemia: Secondary | ICD-10-CM

## 2024-03-16 DIAGNOSIS — M0579 Rheumatoid arthritis with rheumatoid factor of multiple sites without organ or systems involvement: Secondary | ICD-10-CM | POA: Diagnosis not present

## 2024-03-16 DIAGNOSIS — I152 Hypertension secondary to endocrine disorders: Secondary | ICD-10-CM | POA: Diagnosis not present

## 2024-03-16 DIAGNOSIS — E66813 Obesity, class 3: Secondary | ICD-10-CM

## 2024-03-16 DIAGNOSIS — E538 Deficiency of other specified B group vitamins: Secondary | ICD-10-CM

## 2024-03-16 DIAGNOSIS — Z6841 Body Mass Index (BMI) 40.0 and over, adult: Secondary | ICD-10-CM

## 2024-03-16 DIAGNOSIS — E559 Vitamin D deficiency, unspecified: Secondary | ICD-10-CM

## 2024-03-16 LAB — POCT GLYCOSYLATED HEMOGLOBIN (HGB A1C): HbA1c POC (<> result, manual entry): 6 % (ref 4.0–5.6)

## 2024-03-16 MED ORDER — MECLIZINE HCL 25 MG PO TABS
25.0000 mg | ORAL_TABLET | Freq: Three times a day (TID) | ORAL | 0 refills | Status: AC | PRN
  Filled 2024-03-16: qty 90, 30d supply, fill #0

## 2024-03-16 NOTE — Patient Instructions (Addendum)
 SABRA

## 2024-03-17 ENCOUNTER — Other Ambulatory Visit: Payer: Self-pay

## 2024-03-20 ENCOUNTER — Other Ambulatory Visit (HOSPITAL_BASED_OUTPATIENT_CLINIC_OR_DEPARTMENT_OTHER): Payer: Self-pay

## 2024-03-21 ENCOUNTER — Other Ambulatory Visit: Payer: Self-pay

## 2024-03-21 DIAGNOSIS — M0579 Rheumatoid arthritis with rheumatoid factor of multiple sites without organ or systems involvement: Secondary | ICD-10-CM

## 2024-03-22 ENCOUNTER — Encounter: Payer: Self-pay | Admitting: Family Medicine

## 2024-03-22 ENCOUNTER — Ambulatory Visit: Payer: Self-pay | Admitting: Family Medicine

## 2024-03-22 ENCOUNTER — Other Ambulatory Visit (HOSPITAL_BASED_OUTPATIENT_CLINIC_OR_DEPARTMENT_OTHER): Payer: Self-pay

## 2024-03-22 ENCOUNTER — Other Ambulatory Visit

## 2024-03-22 DIAGNOSIS — M0579 Rheumatoid arthritis with rheumatoid factor of multiple sites without organ or systems involvement: Secondary | ICD-10-CM

## 2024-03-22 LAB — CBC WITH DIFFERENTIAL/PLATELET
Basophils Absolute: 0 x10E3/uL (ref 0.0–0.2)
Basos: 1 %
EOS (ABSOLUTE): 0.1 x10E3/uL (ref 0.0–0.4)
Eos: 1 %
Hematocrit: 38.7 % (ref 34.0–46.6)
Hemoglobin: 12.5 g/dL (ref 11.1–15.9)
Immature Grans (Abs): 0 x10E3/uL (ref 0.0–0.1)
Immature Granulocytes: 0 %
Lymphocytes Absolute: 1.4 x10E3/uL (ref 0.7–3.1)
Lymphs: 20 %
MCH: 29.1 pg (ref 26.6–33.0)
MCHC: 32.3 g/dL (ref 31.5–35.7)
MCV: 90 fL (ref 79–97)
Monocytes Absolute: 0.5 x10E3/uL (ref 0.1–0.9)
Monocytes: 7 %
Neutrophils Absolute: 4.6 x10E3/uL (ref 1.4–7.0)
Neutrophils: 71 %
Platelets: 314 x10E3/uL (ref 150–450)
RBC: 4.29 x10E6/uL (ref 3.77–5.28)
RDW: 12.8 % (ref 11.7–15.4)
WBC: 6.6 x10E3/uL (ref 3.4–10.8)

## 2024-03-22 LAB — COMPREHENSIVE METABOLIC PANEL WITH GFR
ALT: 9 IU/L (ref 0–32)
AST: 10 IU/L (ref 0–40)
Albumin: 4 g/dL (ref 3.9–4.9)
Alkaline Phosphatase: 92 IU/L (ref 49–135)
BUN/Creatinine Ratio: 24 (ref 12–28)
BUN: 16 mg/dL (ref 8–27)
Bilirubin Total: 0.3 mg/dL (ref 0.0–1.2)
CO2: 25 mmol/L (ref 20–29)
Calcium: 9.4 mg/dL (ref 8.7–10.3)
Chloride: 102 mmol/L (ref 96–106)
Creatinine, Ser: 0.68 mg/dL (ref 0.57–1.00)
Globulin, Total: 2 g/dL (ref 1.5–4.5)
Glucose: 109 mg/dL — ABNORMAL HIGH (ref 70–99)
Potassium: 4.2 mmol/L (ref 3.5–5.2)
Sodium: 142 mmol/L (ref 134–144)
Total Protein: 6 g/dL (ref 6.0–8.5)
eGFR: 97 mL/min/1.73 (ref >59)

## 2024-04-05 ENCOUNTER — Ambulatory Visit: Payer: Self-pay | Admitting: Family Medicine

## 2024-04-16 ENCOUNTER — Other Ambulatory Visit: Payer: Self-pay | Admitting: Family Medicine

## 2024-04-16 DIAGNOSIS — E782 Mixed hyperlipidemia: Secondary | ICD-10-CM

## 2024-04-17 ENCOUNTER — Other Ambulatory Visit (HOSPITAL_BASED_OUTPATIENT_CLINIC_OR_DEPARTMENT_OTHER): Payer: Self-pay

## 2024-04-17 MED ORDER — MELOXICAM 15 MG PO TABS
15.0000 mg | ORAL_TABLET | Freq: Every day | ORAL | 0 refills | Status: AC
  Filled 2024-04-17 – 2024-04-24 (×2): qty 90, 90d supply, fill #0

## 2024-04-17 MED ORDER — ROSUVASTATIN CALCIUM 10 MG PO TABS
10.0000 mg | ORAL_TABLET | Freq: Every day | ORAL | 0 refills | Status: DC
  Filled 2024-04-17: qty 90, 90d supply, fill #0

## 2024-04-18 ENCOUNTER — Other Ambulatory Visit: Payer: Self-pay

## 2024-04-18 ENCOUNTER — Other Ambulatory Visit (HOSPITAL_BASED_OUTPATIENT_CLINIC_OR_DEPARTMENT_OTHER): Payer: Self-pay

## 2024-04-18 DIAGNOSIS — J018 Other acute sinusitis: Secondary | ICD-10-CM

## 2024-04-18 MED ORDER — FLUTICASONE PROPIONATE 50 MCG/ACT NA SUSP
2.0000 | Freq: Every day | NASAL | 6 refills | Status: AC
  Filled 2024-04-18: qty 16, 30d supply, fill #0
  Filled 2024-06-20: qty 16, 30d supply, fill #1

## 2024-04-24 ENCOUNTER — Other Ambulatory Visit (HOSPITAL_BASED_OUTPATIENT_CLINIC_OR_DEPARTMENT_OTHER): Payer: Self-pay

## 2024-04-25 ENCOUNTER — Other Ambulatory Visit (HOSPITAL_BASED_OUTPATIENT_CLINIC_OR_DEPARTMENT_OTHER): Payer: Self-pay

## 2024-04-25 MED ORDER — FOLIC ACID 1 MG PO TABS
1.0000 mg | ORAL_TABLET | Freq: Every day | ORAL | 3 refills | Status: AC
  Filled 2024-04-25 – 2024-05-22 (×2): qty 90, 90d supply, fill #0

## 2024-05-11 ENCOUNTER — Other Ambulatory Visit: Payer: Self-pay

## 2024-05-11 ENCOUNTER — Other Ambulatory Visit (HOSPITAL_BASED_OUTPATIENT_CLINIC_OR_DEPARTMENT_OTHER): Payer: Self-pay

## 2024-05-11 MED ORDER — CYANOCOBALAMIN 1000 MCG/ML IJ SOLN
INTRAMUSCULAR | 1 refills | Status: AC
  Filled 2024-05-11: qty 6, 84d supply, fill #0

## 2024-05-11 MED FILL — Levothyroxine Sodium Tab 125 MCG: ORAL | 90 days supply | Qty: 90 | Fill #1 | Status: AC

## 2024-05-22 ENCOUNTER — Other Ambulatory Visit (HOSPITAL_BASED_OUTPATIENT_CLINIC_OR_DEPARTMENT_OTHER): Payer: Self-pay

## 2024-06-09 ENCOUNTER — Other Ambulatory Visit: Payer: Self-pay | Admitting: Family Medicine

## 2024-06-09 DIAGNOSIS — Z1231 Encounter for screening mammogram for malignant neoplasm of breast: Secondary | ICD-10-CM

## 2024-06-16 ENCOUNTER — Other Ambulatory Visit: Payer: Self-pay | Admitting: Family Medicine

## 2024-06-16 ENCOUNTER — Other Ambulatory Visit (HOSPITAL_BASED_OUTPATIENT_CLINIC_OR_DEPARTMENT_OTHER): Payer: Self-pay

## 2024-06-16 DIAGNOSIS — E782 Mixed hyperlipidemia: Secondary | ICD-10-CM

## 2024-06-16 MED ORDER — ROSUVASTATIN CALCIUM 10 MG PO TABS
10.0000 mg | ORAL_TABLET | Freq: Every day | ORAL | 0 refills | Status: DC
  Filled 2024-06-16 – 2024-07-19 (×2): qty 90, 90d supply, fill #0

## 2024-06-16 MED ORDER — VITAMIN D (ERGOCALCIFEROL) 1.25 MG (50000 UNIT) PO CAPS
50000.0000 [IU] | ORAL_CAPSULE | ORAL | 1 refills | Status: AC
  Filled 2024-06-16: qty 12, 84d supply, fill #0

## 2024-06-19 ENCOUNTER — Ambulatory Visit
Admission: RE | Admit: 2024-06-19 | Discharge: 2024-06-19 | Disposition: A | Source: Ambulatory Visit | Attending: Family Medicine | Admitting: Family Medicine

## 2024-06-19 ENCOUNTER — Other Ambulatory Visit: Payer: Self-pay | Admitting: Medical Genetics

## 2024-06-19 DIAGNOSIS — Z1231 Encounter for screening mammogram for malignant neoplasm of breast: Secondary | ICD-10-CM

## 2024-06-20 ENCOUNTER — Other Ambulatory Visit (HOSPITAL_BASED_OUTPATIENT_CLINIC_OR_DEPARTMENT_OTHER): Payer: Self-pay

## 2024-06-20 ENCOUNTER — Other Ambulatory Visit: Payer: Self-pay | Admitting: Family Medicine

## 2024-06-20 DIAGNOSIS — J018 Other acute sinusitis: Secondary | ICD-10-CM

## 2024-06-20 MED ORDER — MONTELUKAST SODIUM 10 MG PO TABS
10.0000 mg | ORAL_TABLET | Freq: Every day | ORAL | 3 refills | Status: AC
  Filled 2024-06-20: qty 90, 90d supply, fill #0

## 2024-06-23 ENCOUNTER — Ambulatory Visit: Payer: Self-pay | Admitting: Family Medicine

## 2024-06-30 ENCOUNTER — Other Ambulatory Visit (HOSPITAL_COMMUNITY)
Admission: RE | Admit: 2024-06-30 | Discharge: 2024-06-30 | Disposition: A | Payer: Self-pay | Source: Ambulatory Visit | Attending: Medical Genetics | Admitting: Medical Genetics

## 2024-06-30 ENCOUNTER — Other Ambulatory Visit (HOSPITAL_BASED_OUTPATIENT_CLINIC_OR_DEPARTMENT_OTHER): Payer: Self-pay

## 2024-07-04 ENCOUNTER — Other Ambulatory Visit (HOSPITAL_BASED_OUTPATIENT_CLINIC_OR_DEPARTMENT_OTHER): Payer: Self-pay

## 2024-07-05 ENCOUNTER — Other Ambulatory Visit (HOSPITAL_BASED_OUTPATIENT_CLINIC_OR_DEPARTMENT_OTHER): Payer: Self-pay

## 2024-07-09 LAB — GENECONNECT MOLECULAR SCREEN: Genetic Analysis Overall Interpretation: NEGATIVE

## 2024-07-19 ENCOUNTER — Other Ambulatory Visit (HOSPITAL_BASED_OUTPATIENT_CLINIC_OR_DEPARTMENT_OTHER): Payer: Self-pay

## 2024-07-19 ENCOUNTER — Ambulatory Visit: Admitting: Family Medicine

## 2024-07-19 ENCOUNTER — Encounter: Payer: Self-pay | Admitting: Family Medicine

## 2024-07-19 VITALS — BP 136/64 | HR 93 | Temp 98.2°F | Ht 65.0 in | Wt 257.0 lb

## 2024-07-19 DIAGNOSIS — E1159 Type 2 diabetes mellitus with other circulatory complications: Secondary | ICD-10-CM | POA: Diagnosis not present

## 2024-07-19 DIAGNOSIS — J301 Allergic rhinitis due to pollen: Secondary | ICD-10-CM

## 2024-07-19 DIAGNOSIS — M0579 Rheumatoid arthritis with rheumatoid factor of multiple sites without organ or systems involvement: Secondary | ICD-10-CM | POA: Diagnosis not present

## 2024-07-19 DIAGNOSIS — E559 Vitamin D deficiency, unspecified: Secondary | ICD-10-CM | POA: Diagnosis not present

## 2024-07-19 DIAGNOSIS — Z7985 Long-term (current) use of injectable non-insulin antidiabetic drugs: Secondary | ICD-10-CM | POA: Diagnosis not present

## 2024-07-19 DIAGNOSIS — E782 Mixed hyperlipidemia: Secondary | ICD-10-CM | POA: Diagnosis not present

## 2024-07-19 DIAGNOSIS — E038 Other specified hypothyroidism: Secondary | ICD-10-CM

## 2024-07-19 DIAGNOSIS — Z6841 Body Mass Index (BMI) 40.0 and over, adult: Secondary | ICD-10-CM

## 2024-07-19 DIAGNOSIS — E538 Deficiency of other specified B group vitamins: Secondary | ICD-10-CM | POA: Diagnosis not present

## 2024-07-19 DIAGNOSIS — I152 Hypertension secondary to endocrine disorders: Secondary | ICD-10-CM

## 2024-07-19 DIAGNOSIS — E66813 Obesity, class 3: Secondary | ICD-10-CM | POA: Diagnosis not present

## 2024-07-19 LAB — POCT LIPID PANEL
HDL: 51
LDL: 50
Non-HDL: 85
TC: 136
TRG: 175

## 2024-07-19 LAB — POCT GLYCOSYLATED HEMOGLOBIN (HGB A1C): HbA1c POC (<> result, manual entry): 6.7 %

## 2024-07-19 MED ORDER — ROSUVASTATIN CALCIUM 10 MG PO TABS
10.0000 mg | ORAL_TABLET | Freq: Every day | ORAL | 3 refills | Status: AC
  Filled 2024-07-19: qty 90, 90d supply, fill #0

## 2024-07-19 NOTE — Assessment & Plan Note (Signed)
The current medical regimen is effective;  continue present plan and medications. Continue vitamin D 50K weekly.

## 2024-07-19 NOTE — Assessment & Plan Note (Signed)
 Recommend continue to work on eating healthy diet and exercise.

## 2024-07-19 NOTE — Assessment & Plan Note (Signed)
 Previously well controlled Continue Synthroid  at current dose  Recheck TSH and adjust Synthroid  as indicated   Orders:   T4, free   TSH

## 2024-07-19 NOTE — Progress Notes (Signed)
 "  Subjective:  Patient ID: Kathleen Russo, female    DOB: [birth year]  Age: 64 y.o. MRN: [id number]  Chief Complaint  Patient presents with   Medical Management of Chronic Issues    HPI: Discussed the use of AI scribe software for clinical note transcription with the patient, who gave verbal consent to proceed.  History of Present Illness  Glycemic control - Not checking blood glucose daily - Hemoglobin A1c is 6.0 - Walking some, but has fallen off eating as healthy  - Current medication: Ozempic  2 mg weekly  Rheumatoid arthritis management - Medications include Plaquenil  200 mg twice daily, methotrexate  injections, folate, and meloxicam  - Rosaline Salt, NP  Hypertension management - Takes lisinopril /hydrochlorothiazide  10/12.5 mg daily.    Allergic rhinitis symptoms - Takes Singulair  and Flonase  for allergy management - Has roaring in head for last 2 weeks.   Gastrointestinal symptoms - Uses Miralax  as needed  for constipation management  Thyroid disorder management - Takes levothyroxine  125 mcg daily  Lichen sclerosus  - Uses clobetasol  ointment, which is effective  Vitamin deficiency management - Requires monthly B12 injections once monthly - Takes vitamin D  once a week  Patient had 4 days of nausea, headache. Vomited once and some diarrhea. All symptoms resolved. Had sweats, but no temperature.       07/19/2024    8:03 AM 09/02/2023    7:33 AM 05/06/2023    7:34 AM 01/15/2023    7:29 AM 09/04/2022    7:57 AM  Depression screen PHQ 2/9  Decreased Interest 0 0  0 0  Down, Depressed, Hopeless 0 0  0 0  PHQ - 2 Score 0 0  0 0  Altered sleeping 1  0 0   Tired, decreased energy 0  0 0   Change in appetite 0  0 0   Feeling bad or failure about yourself  0  0 0   Trouble concentrating 0  0 0   Moving slowly or fidgety/restless 0  0 0   Suicidal thoughts 0  0 0   PHQ-9 Score 1   0    Difficult doing work/chores Not difficult at all  Not difficult at all Not  difficult at all      Data saved with a previous flowsheet row definition        03/16/2024    9:14 AM  Fall Risk   Falls in the past year? 1  Number falls in past yr: 0  Injury with Fall? 0   Risk for fall due to : No Fall Risks  Follow up Falls evaluation completed     Data saved with a previous flowsheet row definition    Patient Care Team: Sherre Clapper, MD as PCP - General (Family Medicine)   Review of Systems  Constitutional:  Negative for chills, fatigue and fever.  HENT:  Negative for congestion, ear pain and sore throat.   Respiratory:  Negative for cough and shortness of breath.   Cardiovascular:  Negative for chest pain.  Gastrointestinal:  Negative for abdominal pain, constipation, diarrhea, nausea and vomiting.  Genitourinary:  Negative for dysuria and urgency.  Musculoskeletal:  Negative for arthralgias and myalgias.  Neurological:  Negative for dizziness and headaches.  Psychiatric/Behavioral:  Negative for dysphoric mood. The patient is not nervous/anxious.     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   Hyperlipidemia    Hypertension    Hypothyroidism    Lichen sclerosus    OSA (obstructive sleep apnea)    Perirectal abscess 03/09/2022   PONV (postoperative nausea and vomiting)    Primary osteoarthritis    Sleep apnea    USES CPAP   Swelling of both ankles    Type 2 diabetes mellitus (HCC)    Past Surgical History:  Procedure Laterality Date   ABDOMINAL HYSTERECTOMY     BARIATRIC SURGERY     BREAST LUMPECTOMY Left    BREAST SURGERY  1979   ELBOW SURGERY  1990'S   GASTRIC BYPASS  2007   INCISION AND DRAINAGE PERIRECTAL ABSCESS N/A 03/09/2022   Procedure: IRRIGATION AND  DEBRIDEMENT PERIRECTAL ABSCESS;  Surgeon: Ebbie Cough, MD;  Location: [facility];  Service: General;  Laterality: N/A;   INCISION AND DRAINAGE PERIRECTAL ABSCESS N/A 04/22/2023   Procedure: IRRIGATION AND DEBRIDEMENT PERIRECTAL ABSCESS;  Surgeon: Paola Dreama SAILOR, MD;  Location: [facility];  Service: General;  Laterality: N/A;   JOINT REPLACEMENT  2002   RT. KNEE   KNEE ARTHROSCOPY  1993   LT. KNEE   KNEE ARTHROSCOPY W/ LATERAL RELEASE  1987   RT. KNEE   left hip replacement  05/2017   SHOULDER ARTHROSCOPY Right 05/22/2022   SHOULDER SURGERY Right 01/20/2022   rotator cuff repair   TONSILLECTOMY     TOTAL KNEE ARTHROPLASTY  07/06/2011   Procedure: TOTAL KNEE ARTHROPLASTY;  Surgeon: Garnette JONETTA Raman, MD;  Location: [facility];  Service: Orthopedics;  Laterality: Left;    Family History  Problem Relation Age of Onset   Hypertension Mother    Thyroid disease Mother    Diabetes Mother    Other Father        Wegoners disease   Thyroid disease Sister    Diabetes Sister    Breast cancer Daughter    Breast cancer Maternal Aunt    Breast cancer Paternal Aunt    Graves' disease Brother    Social History   Socioeconomic History   Marital status: Married    Spouse name: Not on file   Number of children: Not on file   Years of education: Not on file   Highest education level: Associate degree: occupational, scientist, product/process development, or vocational program  Occupational History   Occupation: retired    Comment: [employer]  Tobacco Use   Smoking status: Never   Smokeless tobacco: Never  Vaping Use   Vaping status: Never Used  Substance and Sexual Activity   Alcohol use: No   Drug use: No   Sexual activity: Yes    Partners: Male    Birth control/protection: Surgical    Comment: hysterectomy  Other Topics Concern   Not on file  Social History Narrative   Not on file   Social Drivers of Health   Tobacco Use: Low Risk (07/19/2024)   Patient History    Smoking Tobacco Use: Never    Smokeless Tobacco Use:  Never    Passive Exposure: Not on file  Financial Resource Strain: Low Risk (07/18/2024)   Overall Financial Resource Strain (CARDIA)    Difficulty of Paying Living Expenses: Not hard at all  Food Insecurity: No Food Insecurity (07/18/2024)   Epic    Worried About Running Out  of Food in the Last Year: Never true    Ran Out of Food in the Last Year: Never true  Transportation Needs: No Transportation Needs (07/18/2024)   Epic    Lack of Transportation (Medical): No    Lack of Transportation (Non-Medical): No  Physical Activity: Insufficiently Active (07/18/2024)   Exercise Vital Sign    Days of Exercise per Week: 2 days    Minutes of Exercise per Session: 10 min  Stress: No Stress Concern Present (07/18/2024)   [employer] of Occupational Health - Occupational Stress Questionnaire    Feeling of Stress: Not at all  Social Connections: Moderately Integrated (07/18/2024)   Social Connection and Isolation Panel    Frequency of Communication with Friends and Family: More than three times a week    Frequency of Social Gatherings with Friends and Family: Three times a week    Attends Religious Services: More than 4 times per year    Active Member of Clubs or Organizations: No    Attends Banker Meetings: Not on file    Marital Status: Married  Depression (PHQ2-9): Low Risk (07/19/2024)   Depression (PHQ2-9)    PHQ-2 Score: 1  Alcohol Screen: Low Risk (12/06/2023)   Alcohol Screen    Last Alcohol Screening Score (AUDIT): 0  Housing: Unknown (07/18/2024)   Epic    Unable to Pay for Housing in the Last Year: No    Number of Times Moved in the Last Year: Not on file    Homeless in the Last Year: No  Utilities: Not At Risk (12/06/2023)   Epic    Threatened with loss of utilities: No  Health Literacy: Adequate Health Literacy (12/06/2023)   B1300 Health Literacy    Frequency of need for help with medical instructions: Never    Objective:  BP 136/64   Pulse 93   Temp 98.2  F (36.8 C)   Ht 5' 5 (1.651 m)   Wt 257 lb (116.6 kg)   SpO2 97%   BMI 42.77 kg/m      07/19/2024    7:59 AM 03/16/2024    8:26 AM 12/06/2023    7:57 AM  BP/Weight  Systolic BP 136 126 128  Diastolic BP 64 82 68  Wt. (Lbs) 257 256 254  BMI 42.77 kg/m2 42.6 kg/m2 42.27 kg/m2    Physical Exam Vitals reviewed.  Constitutional:      Appearance: Normal appearance. She is obese.  HENT:     Right Ear: Tympanic membrane and ear canal normal.     Left Ear: Tympanic membrane and ear canal normal.  Neck:     Vascular: No carotid bruit.  Cardiovascular:     Rate and Rhythm: Normal rate and regular rhythm.     Pulses: Normal pulses.     Heart sounds: Normal heart sounds.  Pulmonary:     Effort: Pulmonary effort is normal. No respiratory distress.     Breath sounds: Normal breath sounds.  Abdominal:     General: Abdomen is flat. Bowel sounds are normal.     Palpations: Abdomen is soft.     Tenderness: There is no abdominal tenderness.  Neurological:     Mental Status: She is alert and oriented to person, place, and time.  Psychiatric:        Mood and Affect: Mood normal.        Behavior: Behavior normal.      Diabetic foot exam was performed with the following findings:  Intact posterior tibialis and dorsalis pedis pulses Pes planus BL       Lab Results  Component Value Date   WBC 6.6 03/22/2024   HGB 12.5 03/22/2024   HCT 38.7 03/22/2024   PLT 314 03/22/2024   GLUCOSE 109 (H) 03/22/2024   CHOL 116 12/06/2023   TRIG 98 12/06/2023   HDL 48 12/06/2023   LDLCALC 50 12/06/2023   ALT 9 03/22/2024   AST 10 03/22/2024   NA 142 03/22/2024   K 4.2 03/22/2024   CL 102 03/22/2024   CREATININE 0.68 03/22/2024   BUN 16 03/22/2024   CO2 25 03/22/2024   TSH 1.310 12/06/2023   INR 0.92 06/25/2011   HGBA1C 6.7 07/19/2024    Results for orders placed or performed in visit on 07/19/24  POCT glycosylated hemoglobin (Hb A1C)   Collection Time: 07/19/24  8:13 AM  Result  Value Ref Range   Hemoglobin A1C     HbA1c POC (<> result, manual entry) 6.7 4.0 - 5.6 %   HbA1c, POC (prediabetic range)     HbA1c, POC (controlled diabetic range)    POCT Lipid Panel   Collection Time: 07/19/24  8:16 AM  Result Value Ref Range   TC 136    HDL 51    TRG 175    LDL 50    Non-HDL 85    TC/HDL    .  Assessment & Plan:   Assessment & Plan Mixed hyperlipidemia Well controlled.  No changes to medicines. Continue rosuvastatin  10 mg before bed.  Continue to work on eating a healthy diet and exercise.  Orders:   POCT Lipid Panel   rosuvastatin  (CRESTOR ) 10 MG tablet; Take 1 tablet (10 mg total) by mouth daily.   Hypertension associated with diabetes (HCC) Diabetes worsened and hypertension slightly above goal for diabtic.  POCT HBA1C of 6.9.  Recommend continue to work on eating healthy diet and exercise. - Continue Ozempic  2 mg weekly. - Continue lisinopril /hct 10/12.5 mg daily.  Orders:   POCT glycosylated hemoglobin (Hb A1C)   Microalbumin / creatinine urine ratio   Other specified hypothyroidism Previously well controlled Continue Synthroid  at current dose  Recheck TSH and adjust Synthroid  as indicated   Orders:   T4, free   TSH   Rheumatoid arthritis involving multiple sites with positive rheumatoid factor (HCC) Management per specialist.  The current medical regimen is effective;  continue present plan and medications. Orders:   CBC with Differential/Platelet   Comprehensive metabolic panel with GFR   Class 3 severe obesity due to excess calories with serious comorbidity and body mass index (BMI) of 40.0 to 44.9 in adult [facility]) - Recommend continue to work on eating healthy diet and exercise.  B12 deficiency Continue b12 shot monthly     Vitamin D  deficiency The current medical regimen is effective;  continue present plan and medications.  Continue vitamin D  50K weekly.      Non-seasonal allergic rhinitis due to pollen Continue flonase ,  singulair . Add allegra, claritin , or zyrtec.  Tinnitus may be secondary to allergies. Monitor.      Body mass index is 42.77 kg/m.   Meds ordered this encounter  Medications   rosuvastatin  (CRESTOR ) 10 MG tablet    Sig: Take 1 tablet (10 mg total) by mouth daily.    Dispense:  90 tablet    Refill:  3    Orders Placed This Encounter  Procedures   CBC with Differential/Platelet   Comprehensive metabolic panel with GFR  T4, free   TSH   Microalbumin / creatinine urine ratio   POCT glycosylated hemoglobin (Hb A1C)   POCT Lipid Panel     Follow-up: Return in about 3 months (around 10/17/2024) for chronic follow up.  An After Visit Summary was printed and given to the patient.  Abigail Free, MD [facility] [phone]    [1]  Current Outpatient Medications on File Prior to Visit  Medication Sig Dispense Refill   acetaminophen  (TYLENOL ) 500 MG tablet Take 2 tablets (1,000 mg total) by mouth 4 (four) times daily. 120 tablet 3   calcium  carbonate (OSCAL) 1500 (600 Ca) MG TABS tablet Take 600 mg of elemental calcium  by mouth daily.     clobetasol  ointment (TEMOVATE ) 0.05 % Apply 1 Application topically to affected area 2 (two) times daily. 30 g 2   cyanocobalamin  (VITAMIN B12) 1000 MCG/ML injection Inject 1 ml weekly for 4 weeks then 1 ml every 4 weeks. 10 mL 1   docusate sodium  (COLACE) 100 MG capsule Take 1 capsule (100 mg total) by mouth 2 (two) times daily. 60 capsule 2   fluticasone  (FLONASE ) 50 MCG/ACT nasal spray Place 2 sprays into both nostrils daily. 16 g 6   folic acid  (FOLVITE ) 1 MG tablet Take 1 tablet (1 mg total) by mouth daily. 90 tablet 3   hydroxychloroquine  (PLAQUENIL ) 200 MG tablet Take 1 tablet (200 mg total) by mouth 2 (two) times daily with food or milk 180 tablet 1   levothyroxine  (SYNTHROID ) 125 MCG tablet Take 1 tablet (125 mcg total) by mouth daily before breakfast 90 tablet 1   lisinopril -hydrochlorothiazide  (ZESTORETIC ) 10-12.5 MG tablet  TAKE 1 TABLET BY MOUTH EVERY DAY 90 tablet 1   loratadine  (CLARITIN ) 10 MG tablet Take 1 tablet (10 mg total) by mouth daily as needed for allergies. 90 tablet 3   meclizine  (ANTIVERT ) 25 MG tablet Take 1 tablet (25 mg total) by mouth 3 (three) times daily as needed for dizziness. 90 tablet 0   meloxicam  (MOBIC ) 15 MG tablet Take 1 tablet (15 mg total) by mouth daily. 90 tablet 0   methocarbamol  (ROBAXIN ) 750 MG tablet Take 1 tablet (750 mg total) by mouth 4 (four) times daily. 120 tablet 1   methotrexate  250 MG/10ML injection Inject 0.8 mLs (20 mg total) into the skin once a week on the same day 10 mL 1   montelukast  (SINGULAIR ) 10 MG tablet Take 1 tablet (10 mg total) by mouth at bedtime. 90 tablet 3   polyethylene glycol powder (MIRALAX ) 17 GM/SCOOP powder Take 8.5-34 g by mouth daily. To correct constipation.  Adjust dose over 1-2 months.  Goal = ~1 bowel movement / day     Semaglutide , 2 MG/DOSE, (OZEMPIC , 2 MG/DOSE,) 8 MG/3ML SOPN Inject 2 mg into the skin once a week. 9 mL 1   SYRINGE-NEEDLE, DISP, 3 ML (B-D 3CC LUER-LOK SYR 25GX5/8) 25G X 5/8 3 ML MISC Use once a week as directed 13 each 4   Vitamin D , Ergocalciferol , (DRISDOL ) 1.25 MG (50000 UNIT) CAPS capsule Take 1 capsule (50,000 Units total) by mouth every 7 (seven) days. 12 capsule 1   No current facility-administered medications on file prior to visit.   "

## 2024-07-19 NOTE — Patient Instructions (Signed)
 Recommend continue to work on eating healthy diet and exercise. Recommend add antihistamine (claritin , allegra, or zyrtec. - generic is fine. )

## 2024-07-19 NOTE — Assessment & Plan Note (Signed)
 Continue flonase , singulair . Add allegra, claritin , or zyrtec.  Tinnitus may be secondary to allergies. Monitor.

## 2024-07-19 NOTE — Assessment & Plan Note (Signed)
 Diabetes worsened and hypertension slightly above goal for diabtic.  POCT HBA1C of 6.9.  Recommend continue to work on eating healthy diet and exercise. - Continue Ozempic  2 mg weekly. - Continue lisinopril /hct 10/12.5 mg daily.  Orders:   POCT glycosylated hemoglobin (Hb A1C)   Microalbumin / creatinine urine ratio

## 2024-07-19 NOTE — Assessment & Plan Note (Signed)
 Well controlled.  No changes to medicines. Continue rosuvastatin  10 mg before bed.  Continue to work on eating a healthy diet and exercise.  Orders:   POCT Lipid Panel   rosuvastatin  (CRESTOR ) 10 MG tablet; Take 1 tablet (10 mg total) by mouth daily.

## 2024-07-19 NOTE — Assessment & Plan Note (Signed)
 Management per specialist.  The current medical regimen is effective;  continue present plan and medications. Orders:   CBC with Differential/Platelet   Comprehensive metabolic panel with GFR

## 2024-07-19 NOTE — Assessment & Plan Note (Signed)
Continue b12 shot monthly

## 2024-07-20 ENCOUNTER — Ambulatory Visit: Payer: Self-pay | Admitting: Family Medicine

## 2024-07-20 LAB — COMPREHENSIVE METABOLIC PANEL WITH GFR
ALT: 19 [IU]/L (ref 0–32)
AST: 23 [IU]/L (ref 0–40)
Albumin: 4.5 g/dL (ref 3.9–4.9)
Alkaline Phosphatase: 84 [IU]/L (ref 49–135)
BUN/Creatinine Ratio: 24 (ref 12–28)
BUN: 18 mg/dL (ref 8–27)
Bilirubin Total: 0.4 mg/dL (ref 0.0–1.2)
CO2: 26 mmol/L (ref 20–29)
Calcium: 9.4 mg/dL (ref 8.7–10.3)
Chloride: 99 mmol/L (ref 96–106)
Creatinine, Ser: 0.74 mg/dL (ref 0.57–1.00)
Globulin, Total: 2.1 g/dL (ref 1.5–4.5)
Glucose: 121 mg/dL — ABNORMAL HIGH (ref 70–99)
Potassium: 4.2 mmol/L (ref 3.5–5.2)
Sodium: 142 mmol/L (ref 134–144)
Total Protein: 6.6 g/dL (ref 6.0–8.5)
eGFR: 90 mL/min/{1.73_m2}

## 2024-07-20 LAB — CBC WITH DIFFERENTIAL/PLATELET
Basophils Absolute: 0.1 10*3/uL (ref 0.0–0.2)
Basos: 1 %
EOS (ABSOLUTE): 0.1 10*3/uL (ref 0.0–0.4)
Eos: 2 %
Hematocrit: 41.4 % (ref 34.0–46.6)
Hemoglobin: 13.4 g/dL (ref 11.1–15.9)
Immature Grans (Abs): 0 10*3/uL (ref 0.0–0.1)
Immature Granulocytes: 0 %
Lymphocytes Absolute: 1.4 10*3/uL (ref 0.7–3.1)
Lymphs: 19 %
MCH: 28.8 pg (ref 26.6–33.0)
MCHC: 32.4 g/dL (ref 31.5–35.7)
MCV: 89 fL (ref 79–97)
Monocytes Absolute: 0.4 10*3/uL (ref 0.1–0.9)
Monocytes: 6 %
Neutrophils Absolute: 5.2 10*3/uL (ref 1.4–7.0)
Neutrophils: 71 %
Platelets: 384 10*3/uL (ref 150–450)
RBC: 4.66 x10E6/uL (ref 3.77–5.28)
RDW: 13.3 % (ref 11.7–15.4)
WBC: 7.2 10*3/uL (ref 3.4–10.8)

## 2024-07-20 LAB — TSH: TSH: 5.44 u[IU]/mL — ABNORMAL HIGH (ref 0.450–4.500)

## 2024-07-20 LAB — MICROALBUMIN / CREATININE URINE RATIO
Creatinine, Urine: 97.7 mg/dL
Microalb/Creat Ratio: 7 mg/g{creat} (ref 0–29)
Microalbumin, Urine: 6.6 ug/mL

## 2024-07-20 LAB — T4, FREE: Free T4: 1.3 ng/dL (ref 0.82–1.77)

## 2024-10-17 ENCOUNTER — Ambulatory Visit: Admitting: Family Medicine
